# Patient Record
Sex: Male | Born: 1965 | Race: Black or African American | Hispanic: No | Marital: Single | Smoking: Current every day smoker
Health system: Southern US, Community
[De-identification: ages and names within clinical notes are randomized; demographics above are authoritative.]

## PROBLEM LIST (undated history)

## (undated) DIAGNOSIS — E119 Type 2 diabetes mellitus without complications: Secondary | ICD-10-CM

## (undated) DIAGNOSIS — I10 Essential (primary) hypertension: Secondary | ICD-10-CM

## (undated) HISTORY — PX: TONSILLECTOMY: SUR1361

## (undated) HISTORY — PX: OTHER SURGICAL HISTORY: SHX169

---

## 2004-06-11 ENCOUNTER — Emergency Department: Payer: Self-pay | Admitting: Emergency Medicine

## 2006-01-20 ENCOUNTER — Emergency Department: Payer: Self-pay | Admitting: Emergency Medicine

## 2006-08-15 ENCOUNTER — Ambulatory Visit: Payer: Self-pay | Admitting: Gastroenterology

## 2017-01-17 ENCOUNTER — Ambulatory Visit: Payer: Self-pay

## 2017-01-29 ENCOUNTER — Ambulatory Visit: Payer: Self-pay | Admitting: Medical

## 2017-01-29 ENCOUNTER — Encounter: Payer: Self-pay | Admitting: Medical

## 2017-01-29 VITALS — BP 160/84 | HR 89 | Temp 98.0°F | Resp 16 | Ht 69.0 in | Wt 260.0 lb

## 2017-01-29 DIAGNOSIS — J069 Acute upper respiratory infection, unspecified: Secondary | ICD-10-CM

## 2017-01-29 DIAGNOSIS — R059 Cough, unspecified: Secondary | ICD-10-CM

## 2017-01-29 DIAGNOSIS — R05 Cough: Secondary | ICD-10-CM

## 2017-01-29 MED ORDER — AZITHROMYCIN 250 MG PO TABS: ORAL_TABLET | ORAL | 0 refills | Status: DC

## 2017-01-29 MED ORDER — ALBUTEROL SULFATE HFA 108 (90 BASE) MCG/ACT IN AERS: 2.0000 | INHALATION_SPRAY | Freq: Four times a day (QID) | RESPIRATORY_TRACT | 2 refills | Status: DC | PRN

## 2017-01-29 NOTE — Progress Notes (Signed)
Subjective:    Patient ID: Randy Deleon, male    DOB: 06/22/1965, 51 y.o.   MRN: [id number]  HPI 51 yo male with cough x 8 days, nonproductive, some post nasal drip and irritation to the throat.  No sore throat now. No fever or chills. No history of pneumonia or lung problems. 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  Sees Dr Samuella CotaJadinski at [facility]. Did not take blood pressure medication today.  Review of Systems  Constitutional: Negative for chills, fatigue and fever.  HENT: Positive for postnasal drip, sinus pressure, sneezing and sore throat (sometimes). Negative for congestion, ear pain, rhinorrhea, sinus pain and trouble swallowing.   Eyes: Negative for discharge and itching.  Respiratory: Positive for cough. Negative for shortness of breath.   Cardiovascular: Negative for chest pain, palpitations and leg swelling.  Gastrointestinal: Positive for abdominal distention (bloated after eating). Negative for abdominal pain.  Endocrine: Negative for cold intolerance and heat intolerance.  Genitourinary: Negative for dysuria.  Musculoskeletal: Negative for myalgias.  Skin: Negative for rash.  Allergic/Immunologic: Positive for environmental allergies (history of allergy injections 2 x /week, stopped in  6 years.) and food allergies (shellfish and lima beans).  Neurological: Negative for dizziness, syncope and light-headedness.  Hematological: Negative for adenopathy.       Objective:   Physical Exam  Constitutional: He is oriented to person, place, and time. He appears well-developed and well-nourished.  HENT:  Head: Normocephalic and atraumatic.  Left Ear: External ear normal.  Mouth/Throat: Oropharynx is clear and moist.  Eyes: Conjunctivae and EOM are normal. Pupils are equal, round, and reactive to light.   Neck: Normal range of motion. Neck supple.  Cardiovascular: Normal rate, regular rhythm and normal heart sounds.  Pulmonary/Chest: Effort normal and breath sounds normal.  Musculoskeletal: Normal range of motion.  Lymphadenopathy:    He has no cervical adenopathy.  Neurological: He is alert and oriented to person, place, and time.  Skin: Skin is warm and dry.  Psychiatric: He has a normal mood and affect. His behavior is normal. Judgment and thought content normal.  Nursing note and vitals reviewed.     Cough in room mild with end expiratory mild wheeze, cleared on coughing.    Assessment & Plan:  Upper respiratory infection/ cough OTC Claritin or Zyertec take as directed for post nasal drip.  OTC Flonase  Take as directed. Using OTC cough medication formulated for diabetics. Declines prescription cough medication Only medications eprescribed today is Z-pak and albuterol MDI. Other are all updates Meds ordered this encounter  Medications  . amLODipine-benazepril (LOTREL) 10-20 MG capsule    Sig: TAKE 1 CAPSULE BY MOUTH EVERY DAY IN THE MORNING    Refill:  2  . busPIRone (BUSPAR) 7.5 MG tablet    Sig: Take 7.5 mg 2 (two) times daily by mouth.    Refill:  1  . INVOKANA 300 MG TABS tablet    Sig: TAKE 1 TABLET BY MOUTH EVERY DAY IN THE MORNING    Refill:  2  . carvedilol (COREG) 12.5 MG tablet    Sig: Take 12.5 mg 2 (two) times daily by mouth.    Refill:  2  . [facility] D3 5000 units capsule    Sig: 1 EVERY MORNING START AFTER COMPLETION OF [id number] UNITS THERAPY  Refill:  2  . fenofibrate 160 MG tablet    Sig: Take 160 mg daily by mouth.    Refill:  2  . pioglitazone (ACTOS) 45 MG tablet    Sig: Take 45 mg daily by mouth.    Refill:  0  . rosuvastatin (CRESTOR) 10 MG tablet    Sig: Take 10 mg daily by mouth.    Refill:  2  . KOMBIGLYZE XR 07-998 MG TB24    Sig: Take 1 tablet 2 (two) times daily by mouth.    Refill:  2  . sildenafil (VIAGRA) 100 MG tablet    Sig: AS NEEDED  DAILY 30 MIN TO 4HR BEFORE INTERCOURSE    Refill:  2  . azithromycin (ZITHROMAX) 250 MG tablet    Sig: Take  2 tablets by mouth today then one tablet days 2-5 take with food.    Dispense:  6 tablet    Refill:  0  . albuterol (PROVENTIL HFA;VENTOLIN HFA) 108 (90 Base) MCG/ACT inhaler    Sig: Inhale 2 puffs every 6 (six) hours as needed into the lungs for wheezing or shortness of breath.    Dispense:  1 Inhaler    Refill:  2  Returtn to clinic in 3-5 days if not improving or sooner if worsening. Patient verbalizes understanding and had no questions at discharge.

## 2017-01-29 NOTE — Patient Instructions (Signed)
Cough, Adult A cough helps to clear your throat and lungs. A cough may last only 2-3 weeks (acute), or it may last longer than 8 weeks (chronic). Many different things can cause a cough. A cough may be a sign of an illness or another medical condition. Follow these instructions at home:  Pay attention to any changes in your cough.  Take medicines only as told by your doctor. ? If you were prescribed an antibiotic medicine, take it as told by your doctor. Do not stop taking it even if you start to feel better. ? Talk with your doctor before you try using a cough medicine.  Drink enough fluid to keep your pee (urine) clear or pale yellow.  If the air is dry, use a cold steam vaporizer or humidifier in your home.  Stay away from things that make you cough at work or at home.  If your cough is worse at night, try using extra pillows to raise your head up higher while you sleep.  Do not smoke, and try not to be around smoke. If you need help quitting, ask your doctor.  Do not have caffeine.  Do not drink alcohol.  Rest as needed. Contact a doctor if:  You have new problems (symptoms).  You cough up yellow fluid (pus).  Your cough does not get better after 2-3 weeks, or your cough gets worse.  Medicine does not help your cough and you are not sleeping well.  You have pain that gets worse or pain that is not helped with medicine.  You have a fever.  You are losing weight and you do not know why.  You have night sweats. Get help right away if:  You cough up blood.  You have trouble breathing.  Your heartbeat is very fast. This information is not intended to replace advice given to you by your health care provider. Make sure you discuss any questions you have with your health care provider. Document Released: 11/17/2010 Document Revised: 08/12/2015 Document Reviewed: 05/13/2014 Elsevier Interactive Patient Education  2018 [employer] Upper Respiratory Infection,  Adult Most upper respiratory infections (URIs) are caused by a virus. A URI affects the nose, throat, and upper air passages. The most common type of URI is often called "the common cold." Follow these instructions at home:  Take medicines only as told by your doctor.  Gargle warm saltwater or take cough drops to comfort your throat as told by your doctor.  Use a warm mist humidifier or inhale steam from a shower to increase air moisture. This may make it easier to breathe.  Drink enough fluid to keep your pee (urine) clear or pale yellow.  Eat soups and other clear broths.  Have a healthy diet.  Rest as needed.  Go back to work when your fever is gone or your doctor says it is okay. ? You may need to stay home longer to avoid giving your URI to others. ? You can also wear a face mask and wash your hands often to prevent spread of the virus.  Use your inhaler more if you have asthma.  Do not use any tobacco products, including cigarettes, chewing tobacco, or electronic cigarettes. If you need help quitting, ask your doctor. Contact a doctor if:  You are getting worse, not better.  Your symptoms are not helped by medicine.  You have chills.  You are getting more short of breath.  You have brown or red mucus.  You have yellow or   brown discharge from your nose.  You have pain in your face, especially when you bend forward.  You have a fever.  You have puffy (swollen) neck glands.  You have pain while swallowing.  You have white areas in the back of your throat. Get help right away if:  You have very bad or constant: ? Headache. ? Ear pain. ? Pain in your forehead, behind your eyes, and over your cheekbones (sinus pain). ? Chest pain.  You have long-lasting (chronic) lung disease and any of the following: ? Wheezing. ? Long-lasting cough. ? Coughing up blood. ? A change in your usual mucus.  You have a stiff neck.  You have changes in  your: ? Vision. ? Hearing. ? Thinking. ? Mood. This information is not intended to replace advice given to you by your health care provider. Make sure you discuss any questions you have with your health care provider. Document Released: 08/23/2007 Document Revised: 11/07/2015 Document Reviewed: 06/11/2013 Elsevier Interactive Patient Education  2018 Elsevier Inc.  

## 2017-04-11 ENCOUNTER — Ambulatory Visit: Payer: Self-pay | Admitting: Medical

## 2017-04-11 ENCOUNTER — Encounter: Payer: Self-pay | Admitting: Medical

## 2017-04-11 VITALS — BP 162/84 | HR 95 | Temp 97.4°F | Ht 69.0 in | Wt 258.2 lb

## 2017-04-11 DIAGNOSIS — B349 Viral infection, unspecified: Secondary | ICD-10-CM

## 2017-04-11 NOTE — Patient Instructions (Addendum)
OTC Mucinex. Take as directed. OTC Motrin orTylenol take as directed.  OTC Flonase take as directed.   Oxymetazoline or Afrin only use  4 days in a row..     Viral Illness, Adult Viruses are tiny germs that can get into a person's body and cause illness. There are many different types of viruses, and they cause many types of illness. Viral illnesses can range from mild to severe. They can affect various parts of the body. Common illnesses that are caused by a virus include colds and the flu. Viral illnesses also include serious conditions such as HIV/AIDS (human immunodeficiency virus/acquired immunodeficiency syndrome). A few viruses have been linked to certain cancers. What are the causes? Many types of viruses can cause illness. Viruses invade cells in your body, multiply, and cause the infected cells to malfunction or die. When the cell dies, it releases more of the virus. When this happens, you develop symptoms of the illness, and the virus continues to spread to other cells. If the virus takes over the function of the cell, it can cause the cell to divide and grow out of control, as is the case when a virus causes cancer. Different viruses get into the body in different ways. You can get a virus by:  Swallowing food or water that is contaminated with the virus.  Breathing in droplets that have been coughed or sneezed into the air by an infected person.  Touching a surface that has been contaminated with the virus and then touching your eyes, nose, or mouth.  Being bitten by an insect or animal that carries the virus.  Having sexual contact with a person who is infected with the virus.  Being exposed to blood or fluids that contain the virus, either through an open cut or during a transfusion.  If a virus enters your body, your body's defense system (immune system) will try to fight the virus. You may be at higher risk for a viral illness if your immune system is weak. What are the  signs or symptoms? Symptoms vary depending on the type of virus and the location of the cells that it invades. Common symptoms of the main types of viral illnesses include: Cold and flu viruses  Fever.  Headache.  Sore throat.  Muscle aches.  Nasal congestion.  Cough. Digestive system (gastrointestinal) viruses  Fever.  Abdominal pain.  Nausea.  Diarrhea. Liver viruses (hepatitis)  Loss of appetite.  Tiredness.  Yellowing of the skin (jaundice). Brain and spinal cord viruses  Fever.  Headache.  Stiff neck.  Nausea and vomiting.  Confusion or sleepiness. Skin viruses  Warts.  Itching.  Rash. Sexually transmitted viruses  Discharge.  Swelling.  Redness.  Rash. How is this treated? Viruses can be difficult to treat because they live within cells. Antibiotic medicines do not treat viruses because these drugs do not get inside cells. Treatment for a viral illness may include:  Resting and drinking plenty of fluids.  Medicines to relieve symptoms. These can include over-the-counter medicine for pain and fever, medicines for cough or congestion, and medicines to relieve diarrhea.  Antiviral medicines. These drugs are available only for certain types of viruses. They may help reduce flu symptoms if taken early. There are also many antiviral medicines for hepatitis and HIV/AIDS.  Some viral illnesses can be prevented with vaccinations. A common example is the flu shot. Follow these instructions at home: Medicines   Take over-the-counter and prescription medicines only as told by your health care  provider.  If you were prescribed an antiviral medicine, take it as told by your health care provider. Do not stop taking the medicine even if you start to feel better.  Be aware of when antibiotics are needed and when they are not needed. Antibiotics do not treat viruses. If your health care provider thinks that you may have a bacterial infection as well as  a viral infection, you may get an antibiotic. ? Do not ask for an antibiotic prescription if you have been diagnosed with a viral illness. That will not make your illness go away faster. ? Frequently taking antibiotics when they are not needed can lead to antibiotic resistance. When this develops, the medicine no longer works against the bacteria that it normally fights. General instructions  Drink enough fluids to keep your urine clear or pale yellow.  Rest as much as possible.  Return to your normal activities as told by your health care provider. Ask your health care provider what activities are safe for you.  Keep all follow-up visits as told by your health care provider. This is important. How is this prevented? Take these actions to reduce your risk of viral infection:  Eat a healthy diet and get enough rest.  Wash your hands often with soap and water. This is especially important when you are in public places. If soap and water are not available, use hand sanitizer.  Avoid close contact with friends and family who have a viral illness.  If you travel to areas where viral gastrointestinal infection is common, avoid drinking water or eating raw food.  Keep your immunizations up to date. Get a flu shot every year as told by your health care provider.  Do not share toothbrushes, nail clippers, razors, or needles with other people.  Always practice safe sex.  Contact a health care provider if:  You have symptoms of a viral illness that do not go away.  Your symptoms come back after going away.  Your symptoms get worse. Get help right away if:  You have trouble breathing.  You have a severe headache or a stiff neck.  You have severe vomiting or abdominal pain. This information is not intended to replace advice given to you by your health care provider. Make sure you discuss any questions you have with your health care provider. Document Released: 07/16/2015 Document  Revised: 08/18/2015 Document Reviewed: 07/16/2015 Elsevier Interactive Patient Education  [employer].

## 2017-04-11 NOTE — Progress Notes (Signed)
   Subjective:    Patient ID: Randy Deleon, male    DOB: [birth year], 52 y.o.   MRN: [id number]  HPI 52 yo male in non acute distress with nasal congestion x 1 day ( started yesterday with sneezing , went and laid down woke up with nasal congestion, no body aches. Took no over the counter medications. No fever or chills..Eyes watering. No wheezing. Smoker 1/2 ppd.    Review of Systems  Constitutional: Negative for chills and fever.  HENT: Positive for congestion, rhinorrhea (clear) and sneezing. Negative for ear pain, sinus pressure, sinus pain and sore throat.   Eyes: Negative for discharge and itching.  Respiratory: Negative for cough and shortness of breath.   Cardiovascular: Negative for chest pain.  Gastrointestinal: Negative for abdominal pain.  Musculoskeletal: Negative for myalgias.  Skin: Negative for rash.  Allergic/Immunologic: Positive for environmental allergies (allergy injections  6 years ago. x 2 years. allergies to  pollen , grasses.) and food allergies.  Neurological: Negative for dizziness and syncope.  Hematological: Negative for adenopathy.       Objective:   Physical Exam  Constitutional: He is oriented to person, place, and time. He appears well-developed and well-nourished.  HENT:  Head: Normocephalic and atraumatic.  Right Ear: External ear normal.  Left Ear: External ear normal.  Nose: Mucosal edema and rhinorrhea present.  Mouth/Throat: Oropharynx is clear and moist.  Eyes: Conjunctivae and EOM are normal. Pupils are equal, round, and reactive to light.  Neck: Normal range of motion. Neck supple.  Cardiovascular: Normal rate, regular rhythm and normal heart sounds.  Pulmonary/Chest: Effort normal. No respiratory distress. He has no wheezes. He has no rales.  Musculoskeletal: Normal range of motion.  Lymphadenopathy:    He has no cervical adenopathy.  Neurological: He is alert and oriented to person, place, and time.  Skin: Skin is warm and dry.   Psychiatric: He has a normal mood and affect. His behavior is normal. Judgment and thought content normal.  Nursing note and vitals reviewed.    Turbinate swelling bilateral. sneezing in room    Assessment & Plan:  Viral illness. Reviewed with patient that antibiotics do not help viruses. Plain Mucinex take as directed. OTC Flonase take as directed while sick only. If you have OTC Afrin at  Home  take as directed 4 days only. OTC Motrin or Tylenol take as directed. Given Tylenol 1 gram in clinic .[id number] exp  6/21. Contact the office if discharge or productive cough in green. Fever 101 or higher or if sick longer than 10 days. Rest , increase fluids. Return to the clinic as needed. Patient verbalizes understanding and has no questions at discharge.

## 2017-11-19 ENCOUNTER — Ambulatory Visit: Payer: Self-pay | Admitting: Adult Health

## 2017-11-19 ENCOUNTER — Encounter: Payer: Self-pay | Admitting: Medical

## 2017-11-19 VITALS — BP 145/91 | HR 94 | Temp 98.2°F | Resp 16 | Wt 220.8 lb

## 2017-11-19 DIAGNOSIS — R5383 Other fatigue: Secondary | ICD-10-CM | POA: Insufficient documentation

## 2017-11-19 DIAGNOSIS — K1379 Other lesions of oral mucosa: Secondary | ICD-10-CM

## 2017-11-19 DIAGNOSIS — R531 Weakness: Secondary | ICD-10-CM | POA: Insufficient documentation

## 2017-11-19 DIAGNOSIS — R634 Abnormal weight loss: Secondary | ICD-10-CM

## 2017-11-19 DIAGNOSIS — B370 Candidal stomatitis: Secondary | ICD-10-CM

## 2017-11-19 MED ORDER — NYSTATIN 100000 UNIT/ML MT SUSP: 5.0000 mL | Freq: Four times a day (QID) | OROMUCOSAL | 0 refills | Status: DC

## 2017-11-19 NOTE — Patient Instructions (Addendum)
Nystatin oral suspension What is this medicine? NYSTATIN (nye STAT in) is an antifungal medicine. It is used to treat certain kinds of fungal or yeast infections. This medicine may be used for other purposes; ask your health care provider or pharmacist if you have questions. COMMON BRAND NAME(S): Mycostatin, Nystex What should I tell my health care provider before I take this medicine? They need to know if you have any of these conditions: -diabetes -kidney disease -an unusual or allergic reaction to nystatin, ethylenediamine, parabens, thimerosal, other foods, dyes or preservatives -pregnant or trying to get pregnant -breast-feeding How should I use this medicine? Follow the directions on the prescription label. Shake well before using. Use a specially marked dropper to measure every dose. Ask your pharmacist if you do not have one. Put one half of the dose in each side of your mouth. Swish the medicine around in your mouth and gargle. Hold your dose in your mouth for as long as you can. Swallow or spit out as directed by your doctor. Take your medicine at regular intervals. Do not take your medicine more often than directed. Do not skip doses or stop your medicine early even if you feel better. Do not stop taking except on your doctor's advice. Talk to your pediatrician regarding the use of this medicine in children. Special care may be needed. Overdosage: If you think you have taken too much of this medicine contact a poison control center or emergency room at once. NOTE: This medicine is only for you. Do not share this medicine with others. What if I miss a dose? If you miss a dose, take it as soon as you can. If it is almost time for your next dose, take only that dose. Do not take double or extra doses. What may interact with this medicine? Interactions are not expected. This list may not describe all possible interactions. Give your health care provider a list of all the medicines, herbs,  non-prescription drugs, or dietary supplements you use. Also tell them if you smoke, drink alcohol, or use illegal drugs. Some items may interact with your medicine. What should I watch for while using this medicine? Tell your doctor or health care professional if your symptoms do not improve or get worse. If you wear dentures talk to your doctor about how to clean them. What side effects may I notice from receiving this medicine? Side effects that you should report to your doctor or health care professional as soon as possible: -allergic reactions like skin rash, itching or hives, swelling of the face, lips, or tongue -fast heart beat -redness, blistering, peeling or loosening of the skin, including inside the mouth -trouble breathing Side effects that usually do not require medical attention (report to your doctor or health care professional if they continue or are bothersome): -diarrhea -muscle aches or pains -nausea, vomiting -stomach upset This list may not describe all possible side effects. Call your doctor for medical advice about side effects. You may report side effects to FDA at 1-800-FDA-1088. Where should I keep my medicine? Keep out of the reach of children. Store at room temperature between 15 and 25 degrees C (59 and 77 degrees F). Protect from light. Throw away any unused medicine after the expiration date. NOTE: This sheet is a summary. It may not cover all possible information. If you have questions about this medicine, talk to your doctor, pharmacist, or health care provider.  2018 Elsevier/Gold Standard (2008-04-07 13:21:00) Diabetes Mellitus and Sick Day Management  Blood sugar (glucose) can be difficult to control when you are sick. Common illnesses that can cause problems for people with diabetes (diabetes mellitus) include colds, fever, flu (influenza), nausea, vomiting, and diarrhea. These illnesses can cause stress and loss of body fluids (dehydration), and those  issues can cause blood glucose levels to increase. Because of this, it is very important to take your insulin and diabetes medicines and eat some form of carbohydrate when you are sick. You should make a plan for days when you are sick (sick day plan) as part of your diabetes management plan. You and your health care provider should make this plan in advance. The following guidelines are intended to help you manage an illness that lasts for about 24 hours or less. Your health care provider may also give you more specific instructions. What do I need to do to manage my blood glucose?  Check your blood glucose every 2-4 hours, or as often as told by your health care provider.  Know your sick day treatment goals. Your target blood glucose levels may be different when you are sick.  If you use insulin, take your usual dose. ? If your blood glucose continues to be too high, you may need to take an additional insulin dose as told by your health care provider.  If you use oral diabetes medicine, you may need to stop taking it if you are not able to eat or drink normally. Ask your health care provider about whether you need to stop taking these medicines while you are sick.  If you use injectable hormone medicines other than insulin to control your diabetes, ask your health care provider about whether you need to stop taking these medicines while you are sick. What else can I do to manage my diabetes when I am sick? Check your ketones  If you have type 1 diabetes, check your urine ketones every 4 hours.  If you have type 2 diabetes, check your urine ketones as often as told by your health care provider. Drink fluids  Drink enough fluid to keep your urine clear or pale yellow. This is especially important if you have a fever, vomiting, or diarrhea. Those symptoms can lead to dehydration.  Follow any instructions from your health care provider about beverages to avoid. ? Do not drink alcohol,  caffeine, or drinks that contain a lot of sugar. Take medicines as directed  Take-over-the-counter and prescription medicines only as told by your health care provider.  Check medicine labels for added sugars. Some medicines may contain sugar or types of sugars that can raise your blood glucose level. What foods can I eat when I am sick? You need to eat some form of carbohydrates when you are sick. You should eat 45-50 grams (45-50 g) of carbohydrates every 3-4 hours until you feel better. All of the food choices below contain about 15 g of carbohydrates. Plan ahead and keep some of these foods around so you have them if you get sick.  4-6 oz (120-177 mL) carbonated beverage that contains sugar, such as regular (not diet) soda. You may be able to drink carbonated beverages more easily if you open the beverage and let it sit at room temperature for a few minutes before drinking.   of a twin frozen ice pop.  4 oz (120 g) regular gelatin.  4 oz (120 mL) fruit juice.  4 oz (120 g) ice cream or frozen yogurt.  2 oz (60 g) sherbet.  8 oz (  240 mL) clear broth or soup.  4 oz (120 g) regular custard.  4 oz (120 g) regular pudding.  8 oz (240 g) plain yogurt.  1 slice bread or toast.  6 saltine crackers.  5 vanilla wafers.  Questions to ask your health care provider Consider asking the following questions so you know what to do on days when you are sick:  Should I adjust my diabetes medicines?  How often do I need to check my blood glucose?  What supplies do I need to manage my diabetes at home when I am sick?  What number can I call if I have questions?  What foods and drinks should I avoid?  Contact a health care provider if:  You develop symptoms of diabetic ketoacidosis, such as: ? Fatigue. ? Weight loss. ? Excessive thirst. ? Light-headedness. ? Fruity or sweet-smelling breath. ? Excessive urination. ? Vision changes. ? Confusion or  irritability. ? Nausea. ? Vomiting. ? Rapid breathing. ? Pain in the abdomen. ? Feeling flushed.  You are unable to drink fluids without vomiting.  You have any of the following for more than 6 hours: ? Nausea. ? Vomiting. ? Diarrhea.  Your blood glucose is at or above 240 mg/dL (16.1 mmol/L), even after you take an additional insulin dose.  You have a change in how you think, feel, or act (mental status).  You develop another serious illness.  You have been sick or have had a fever for 2 days or longer and you are not getting better. Get help right away if:  Your blood glucose is lower than 54 mg/dL (3.0 mmol/L).  You have difficulty breathing.  You have moderate or high ketone levels in your urine.  You used emergency glucagon to treat low blood glucose. Summary  Blood sugar (glucose) can be difficult to control when you are sick. Common illnesses that can cause problems for people with diabetes (diabetes mellitus) include colds, fever, flu (influenza), nausea, vomiting, and diarrhea.  Illnesses can cause stress and loss of body fluids (dehydration), and those issues can cause blood glucose levels to increase.  Make a plan for days when you are sick (sick day plan) as part of your diabetes management plan. You and your health care provider should make this plan in advance.  It is very important to take your insulin and diabetes medicines and to eat some form of carbohydrate when you are sick.  Contact your health care provider if have problems managing your blood glucose levels when you are sick, or if you have been sick or had a fever for 2 days or longer and are not getting better. This information is not intended to replace advice given to you by your health care provider. Make sure you discuss any questions you have with your health care provider. Document Released: 03/09/2003 Document Revised: 12/03/2015 Document Reviewed: 12/03/2015 Elsevier Interactive Patient  Education  2018 [employer]. Fatigue Fatigue is feeling tired all of the time, a lack of energy, or a lack of motivation. Occasional or mild fatigue is often a normal response to activity or life in general. However, long-lasting (chronic) or extreme fatigue may indicate an underlying medical condition. Follow these instructions at home: Watch your fatigue for any changes. The following actions may help to lessen any discomfort you are feeling:  Talk to your health care provider about how much sleep you need each night. Try to get the required amount every night.  Take medicines only as directed by your  health care provider.  Eat a healthy and nutritious diet. Ask your health care provider if you need help changing your diet.  Drink enough fluid to keep your urine clear or pale yellow.  Practice ways of relaxing, such as yoga, meditation, massage therapy, or acupuncture.  Exercise regularly.  Change situations that cause you stress. Try to keep your work and personal routine reasonable.  Do not abuse illegal drugs.  Limit alcohol intake to no more than 1 drink per day for nonpregnant women and 2 drinks per day for men. One drink equals 12 ounces of beer, 5 ounces of wine, or 1 ounces of hard liquor.  Take a multivitamin, if directed by your health care provider.  Contact a health care provider if:  Your fatigue does not get better.  You have a fever.  You have unintentional weight loss or gain.  You have headaches.  You have difficulty: ? Falling asleep. ? Sleeping throughout the night.  You feel angry, guilty, anxious, or sad.  You are unable to have a bowel movement (constipation).  You skin is dry.  Your legs or another part of your body is swollen. Get help right away if:  You feel confused.  Your vision is blurry.  You feel faint or pass out.  You have a severe headache.  You have severe abdominal, pelvic, or back pain.  You have chest pain,  shortness of breath, or an irregular or fast heartbeat.  You are unable to urinate or you urinate less than normal.  You develop abnormal bleeding, such as bleeding from the rectum, vagina, nose, lungs, or nipples.  You vomit blood.  You have thoughts about harming yourself or committing suicide.  You are worried that you might harm someone else. This information is not intended to replace advice given to you by your health care provider. Make sure you discuss any questions you have with your health care provider. Document Released: 01/01/2007 Document Revised: 08/12/2015 Document Reviewed: 07/08/2013 Elsevier Interactive Patient Education  2018 [employer]. Oral Thrush, Adult Oral thrush is an infection in your mouth and throat. It causes white patches on your tongue and in your mouth. Follow these instructions at home: Helping with soreness  To lessen your pain: ? Drink cold liquids, like water and iced tea. ? Eat frozen ice pops or frozen juices. ? Eat foods that are easy to swallow, like gelatin and ice cream. ? Drink from a straw if the patches in your mouth are painful. General instructions   Take or use over-the-counter and prescription medicines only as told by your doctor. Medicine for oral thrush may be something to swallow, or it may be something to put on the infected area.  Eat plain yogurt that has live cultures in it. Read the label to make sure.  If you wear dentures: ? Take out your dentures before you go to bed. ? Brush them well. ? Soak them in a denture cleaner.  Rinse your mouth with warm salt-water many times a day. To make the salt-water mixture, completely dissolve 1/2-1 teaspoon of salt in 1 cup of warm water. Contact a doctor if:  Your problems are getting worse.  Your problems do not get better in less than 7 days with treatment.  Your infection is spreading. This may show as white patches on the skin outside of your mouth.  You are nursing  your baby and you have redness and pain in the nipples. This information is not intended to replace  advice given to you by your health care provider. Make sure you discuss any questions you have with your health care provider. Document Released: 05/31/2009 Document Revised: 11/29/2015 Document Reviewed: 11/29/2015 Elsevier Interactive Patient Education  2017 [employer] Probiotics What are probiotics? Probiotics are the good bacteria and yeasts that live in your body and keep you and your digestive system healthy. Probiotics also help your body's defense (immune) system and protect your body against bad bacterial growth. Certain foods contain probiotics, such as yogurt. Probiotics can also be purchased as a supplement. As with any supplement or drug, it is important to discuss its use with your health care provider. What affects the balance of bacteria in my body? The balance of bacteria in your body can be affected by:  Antibiotic medicines. Antibiotics are sometimes necessary to treat infection. Unfortunately, they may kill good or friendly bacteria in your body as well as the bad bacteria. This may lead to stomach problems like diarrhea, gas, and cramping.  Disease. Some conditions are the result of an overgrowth of bad bacteria, yeasts, parasites, or fungi. These conditions include: ? Infectious diarrhea. ? Stomach and respiratory infections. ? Skin infections. ? Irritable bowel syndrome (IBS). ? Inflammatory bowel diseases. ? Ulcer due to Helicobacter pylori (H. pylori) infection. ? Tooth decay and periodontal disease. ? Vaginal infections.  Stress and poor diet may also lower the good bacteria in your body. What type of probiotic is right for me? Probiotics are available over the counter at your local pharmacy, health food, or grocery store. They come in many different forms, combinations of strains, and dosing strengths. Some may need to be refrigerated. Always read the label for  storage and usage instructions. Specific strains have been shown to be more effective for certain conditions. Ask your health care provider what option is best for you. Why would I need probiotics? There are many reasons your health care provider might recommend a probiotic supplement, including:  Diarrhea.  Constipation.  IBS.  Respiratory infections.  Yeast infections.  Acne, eczema, and other skin conditions.  Frequent urinary tract infections (UTIs).  Are there side effects of probiotics? Some people experience mild side effects when taking probiotics. Side effects are usually temporary and may include:  Gas.  Bloating.  Cramping.  Rarely, serious side effects, such as infection or immune system changes, may occur. What else do I need to know about probiotics?  There are many different strains of probiotics. Certain strains may be more effective depending on your condition. Probiotics are available in varying doses. Ask your health care provider which probiotic you should use and how often.  If you are taking probiotics along with antibiotics, it is generally recommended to wait at least 2 hours between taking the antibiotic and taking the probiotic. For more information: [facility] [email or web address] This information is not intended to replace advice given to you by your health care provider. Make sure you discuss any questions you have with your health care provider. Document Released: 10/01/2013 Document Revised: 02/01/2016 Document Reviewed: 06/03/2013 Elsevier Interactive Patient Education  2017 [employer] Inc[employer]  Health Maintenance, Male A healthy lifestyle and preventive care is important for your health and wellness. Ask your health care provider about what schedule of regular examinations is right for you. What should I know about weight and diet? Eat a Healthy Diet  Eat plenty of vegetables, fruits, whole  grains, low-fat dairy products, and lean protein.  Do not eat a lot of  foods high in solid fats, added sugars, or salt.  Maintain a Healthy Weight Regular exercise can help you achieve or maintain a healthy weight. You should:  Do at least 150 minutes of exercise each week. The exercise should increase your heart rate and make you sweat (moderate-intensity exercise).  Do strength-training exercises at least twice a week.  Watch Your Levels of Cholesterol and Blood Lipids  Have your blood tested for lipids and cholesterol every 5 years starting at 50 years of age. If you are at high risk for heart disease, you should start having your blood tested when you are 50 years old. You may need to have your cholesterol levels checked more often if: ? Your lipid or cholesterol levels are high. ? You are older than 50 years of age. ? You are at high risk for heart disease.  What should I know about cancer screening? Many types of cancers can be detected early and may often be prevented. Lung Cancer  You should be screened every year for lung cancer if: ? You are a current smoker who has smoked for at least 30 years. ? You are a former smoker who has quit within the past 15 years.  Talk to your health care provider about your screening options, when you should start screening, and how often you should be screened.  Colorectal Cancer  Routine colorectal cancer screening usually begins at 50 years of age and should be repeated every 5-10 years until you are 50 years old. You may need to be screened more often if early forms of precancerous polyps or small growths are found. Your health care provider may recommend screening at an earlier age if you have risk factors for colon cancer.  Your health care provider may recommend using home test kits to check for hidden blood in the stool.  A small camera at the end of a tube can be used to examine your colon (sigmoidoscopy or colonoscopy). This checks  for the earliest forms of colorectal cancer.  Prostate and Testicular Cancer  Depending on your age and overall health, your health care provider may do certain tests to screen for prostate and testicular cancer.  Talk to your health care provider about any symptoms or concerns you have about testicular or prostate cancer.  Skin Cancer  Check your skin from head to toe regularly.  Tell your health care provider about any new moles or changes in moles, especially if: ? There is a change in a mole's size, shape, or color. ? You have a mole that is larger than a pencil eraser.  Always use sunscreen. Apply sunscreen liberally and repeat throughout the day.  Protect yourself by wearing long sleeves, pants, a wide-brimmed hat, and sunglasses when outside.  What should I know about heart disease, diabetes, and high blood pressure?  If you are 28-27 years of age, have your blood pressure checked every 3-5 years. If you are 62 years of age or older, have your blood pressure checked every year. You should have your blood pressure measured twice-once when you are at a hospital or clinic, and once when you are not at a hospital or clinic. Record the average of the two measurements. To check your blood pressure when you are not at a hospital or clinic, you can use: ? An automated blood pressure machine at a pharmacy. ? A home blood pressure monitor.  Talk to your health care provider about your target blood pressure.  If you are between  20-59 years old, ask your health care provider if you should take aspirin to prevent heart disease.  Have regular diabetes screenings by checking your fasting blood sugar level. ? If you are at a normal weight and have a low risk for diabetes, have this test once every three years after the age of 42. ? If you are overweight and have a high risk for diabetes, consider being tested at a younger age or more often.  A one-time screening for abdominal aortic aneurysm  (AAA) by ultrasound is recommended for men aged 65-75 years who are current or former smokers. What should I know about preventing infection? Hepatitis B If you have a higher risk for hepatitis B, you should be screened for this virus. Talk with your health care provider to find out if you are at risk for hepatitis B infection. Hepatitis C Blood testing is recommended for:  Everyone born from [birth year] through 1963.  Anyone with known risk factors for hepatitis C.  Sexually Transmitted Diseases (STDs)  You should be screened each year for STDs including gonorrhea and chlamydia if: ? You are sexually active and are younger than 50 years of age. ? You are older than 50 years of age and your health care provider tells you that you are at risk for this type of infection. ? Your sexual activity has changed since you were last screened and you are at an increased risk for chlamydia or gonorrhea. Ask your health care provider if you are at risk.  Talk with your health care provider about whether you are at high risk of being infected with HIV. Your health care provider may recommend a prescription medicine to help prevent HIV infection.  What else can I do?  Schedule regular health, dental, and eye exams.  Stay current with your vaccines (immunizations).  Do not use any tobacco products, such as cigarettes, chewing tobacco, and e-cigarettes. If you need help quitting, ask your health care provider.  Limit alcohol intake to no more than 2 drinks per day. One drink equals 12 ounces of beer, 5 ounces of wine, or 1 ounces of hard liquor.  Do not use street drugs.  Do not share needles.  Ask your health care provider for help if you need support or information about quitting drugs.  Tell your health care provider if you often feel depressed.  Tell your health care provider if you have ever been abused or do not feel safe at home. This information is not intended to replace advice given to you  by your health care provider. Make sure you discuss any questions you have with your health care provider. Document Released: 09/02/2007 Document Revised: 11/03/2015 Document Reviewed: 12/08/2014 Elsevier Interactive Patient Education  [employer].

## 2017-11-19 NOTE — Progress Notes (Addendum)
Subjective:     Patient ID: Randy Deleon, male   DOB: September 29, 1965, 52 y.o.   MRN: [id number]  HPI   Blood pressure (!) 145/91, pulse 94, temperature 98.2 F (36.8 C), temperature source Tympanic, resp. rate 16, weight 220 lb 12.8 oz (100.2 kg), SpO2 99 %. Patient is 52 year old male in no acute distress who comes to clinic on 11/19/17 he reports decreased energy x 3- 4 days. Fatigue. Mild sores on tongue and throat. He has been drinking Gatorades and water for the past three days. Sunday morning he ate two eggs ad some toast. He has also been eating popsicles as they feel good on his tongue.  He reports he has just got off night shift - he is working two jobs.  He reports pain in throat nagging 2/10 , states " tongue very sore " keeping me from eating much.   Smoker x 20 year 1/2 pp. He reports he has been under stress at home lately. Denies any homicidal or suicidal ideation.  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 Daughter battling addiction. He reports he himself has lost 20 - 30 lbs in past three months as a result.   Patient says he has a primary care he sees regularly Tejan-sie MD at [facility] - seen last 3- 4 months ago. He reports lab work was normal with his MD last visit.  He denies any liver or kidney disease.  He reports he checks his blood glucose intermittently and is usually around 200, he does report he does not take both of his prescribed medications Invokana and Kombiglyze regularly as prescribed and has been taking them less often with increased stress in his life.    258 lbs weight in February 2018 - per nurse Mariel Sleet RN. He has been on Invokana since 10/22/2016.  Patient  denies any fever, body aches,chills, rash, chest pain, shortness of breath, nausea, vomiting, or diarrhea.   Review of Systems  Constitutional: Positive for activity change (tired more in the past 3 days. He is working today as he just got off night shift. He also reports he has two jobs.  ) and  fatigue. Negative for appetite change, chills, diaphoresis, fever and unexpected weight change.  HENT: Positive for mouth sores and sore throat. Negative for congestion, dental problem, drooling, ear discharge, ear pain, facial swelling, hearing loss, nosebleeds, postnasal drip, rhinorrhea, sinus pressure, sinus pain, sneezing, tinnitus, trouble swallowing and voice change.   Eyes: Negative.   Respiratory: Negative.   Cardiovascular: Negative.   Gastrointestinal: Negative.   Endocrine: Negative for cold intolerance, heat intolerance, polydipsia, polyphagia and polyuria.       Denies   Genitourinary: Negative.   Musculoskeletal: Negative.   Skin: Negative.   Neurological: Negative.   Hematological: Negative.   Psychiatric/Behavioral: Negative.        Objective:   Physical Exam  Constitutional: He is oriented to person, place, and time. He appears well-developed and well-nourished. He is active.  Non-toxic appearance. He does not have a sickly appearance. He does not appear ill. No distress.  Patient is alert and oriented and responsive to questions Engages in eye contact with provider. Speaks in full sentences without any pauses without any shortness of breath or distress.   Patient moves on and off of exam table and in room without difficulty. Gait is normal in hall and in room. Patient is oriented to person place time and situation. Patient answers questions appropriately and engages in conversation.  HENT:  Head: Normocephalic and atraumatic.  Right Ear: Hearing and external ear normal. Tympanic membrane is not perforated and not erythematous. A middle ear effusion is present.  Left Ear: Hearing and external ear normal. Tympanic membrane is not perforated and not erythematous. A middle ear effusion is present.  Nose: Nose normal. No mucosal edema or rhinorrhea. Right sinus exhibits no maxillary sinus tenderness and no frontal sinus tenderness. Left sinus exhibits no maxillary sinus  tenderness and no frontal sinus tenderness.  Mouth/Throat: Uvula is midline and mucous membranes are normal. No uvula swelling. Oropharyngeal exudate present. No posterior oropharyngeal edema, posterior oropharyngeal erythema or tonsillar abscesses.    White exudate / coating on tongue and upper palate and oral pharynx as marked on diagram of  mouth consistent with appearance of yeast.  Not previously present per patient. Present x 4 days.   Eyes: Pupils are equal, round, and reactive to light. Conjunctivae, EOM and lids are normal. Right eye exhibits no discharge. Left eye exhibits no discharge. No scleral icterus.  Neck: Normal range of motion. Neck supple.  Cardiovascular: Normal rate, regular rhythm, normal heart sounds and intact distal pulses. Exam reveals no gallop and no friction rub.  No murmur heard. Pulmonary/Chest: Effort normal and breath sounds normal. No stridor. No respiratory distress. He has no wheezes. He has no rales. He exhibits no tenderness.  Abdominal: Soft. Bowel sounds are normal. He exhibits no distension and no mass. There is no tenderness. There is no rebound and no guarding. No hernia.  Round abdomen   Musculoskeletal: Normal range of motion.  Neurological: He is alert and oriented to person, place, and time.  Skin: Skin is warm and dry. Capillary refill takes less than 2 seconds. No rash noted. He is not diaphoretic. No erythema. There is pallor.  Psychiatric: He has a normal mood and affect. His behavior is normal. Judgment and thought content normal.  Vitals reviewed.      Assessment:     Painful mouth  Thrush, oral - Plan: CMP12+LP+TP+TSH+6AC+PSA+CBC., HgB A1c  Fatigue, unspecified type - Plan: VITAMIN D 25 Hydroxy (Vit-D Deficiency, Fractures)  Abnormal weight loss - Plan: CMP12+LP+TP+TSH+6AC+PSA+CBC.      Plan:     Meds ordered this encounter  Medications  . nystatin (MYCOSTATIN) 100000 UNIT/ML suspension    Sig: Take 5 mLs (500,000 Units  total) by mouth 4 (four) times daily.    Dispense:  60 mL    Refill:  0   Discussed oral thrush and related causes such as recent antibiotics which he declines, hyperglycemia, viral infections and other etiologies. Discussed use of Nystatin mouth wash.Intake of foods with probiotics. Avoiding sugary foods and beverages.  [facility] closed on 11/19/17 - He will go in am of 11/20/17 am for fasting labs at [facility] and is advised to follow up with his PCP recommended this week.   If worsening fatigue or any signs of infection such as fever, chills, abdominal pain, or any worsening/new symptom occurs be seen in the emergency room.     Weight loss  Discussed can be normal side effect of Invokana  however he still needs further evaluation and work up with his PCP given fatigue.  Orders Placed This Encounter  Procedures  . CMP12+LP+TP+TSH+6AC+PSA+CBC.  . HgB A1c  . VITAMIN D 25 Hydroxy (Vit-D Deficiency, Fractures)    Standing Status:   Future    Standing Expiration Date:   12/19/2017  He will start checking his blood glucose in the morning  fasting and again in the evening and keep diet log and blood glucose record for his primary care MD. Take medications as prescribed by PCP and follow up regulary as directed.   He is aware he needs to see PCP tomorrow when office opens after holiday. He will go to emergency room if any symptoms worsen. Avoid sugar in diet and check blood sugar regularly twice daily. Discussed high sugar in Gatorade.   Advised patient call the office or your primary care doctor for an appointment if no improvement within 72 hours or if any symptoms change or worsen at any time  Advised ER or urgent Care if after hours or on weekend. Call 911 for emergency symptoms at any time.Patinet verbalized understanding of all instructions given/reviewed and treatment plan and has no further questions or concerns at this time.    Patient verbalized understanding of all instructions given and denies  any further questions at this time.   9/3 sent all labs to Dr. Sullivan Lone to review and recommendation to follow up with his PCP as soon as possible this week.

## 2017-11-20 ENCOUNTER — Other Ambulatory Visit: Payer: Self-pay | Admitting: Adult Health

## 2017-11-21 ENCOUNTER — Telehealth: Payer: Self-pay | Admitting: Adult Health

## 2017-11-21 ENCOUNTER — Telehealth: Payer: Self-pay | Admitting: *Deleted

## 2017-11-21 LAB — CBC WITH DIFFERENTIAL/PLATELET
BASOS ABS: 0 10*3/uL (ref 0.0–0.2)
Basos: 0 %
EOS (ABSOLUTE): 0.2 10*3/uL (ref 0.0–0.4)
Eos: 3 %
HEMOGLOBIN: 15.4 g/dL (ref 13.0–17.7)
Hematocrit: 44.2 % (ref 37.5–51.0)
Immature Grans (Abs): 0 10*3/uL (ref 0.0–0.1)
Immature Granulocytes: 0 %
LYMPHS ABS: 1.8 10*3/uL (ref 0.7–3.1)
Lymphs: 25 %
MCH: 34.6 pg — AB (ref 26.6–33.0)
MCHC: 34.8 g/dL (ref 31.5–35.7)
MCV: 99 fL — AB (ref 79–97)
MONOCYTES: 10 %
Monocytes Absolute: 0.7 10*3/uL (ref 0.1–0.9)
NEUTROS ABS: 4.4 10*3/uL (ref 1.4–7.0)
Neutrophils: 62 %
PLATELETS: 203 10*3/uL (ref 150–450)
RBC: 4.45 x10E6/uL (ref 4.14–5.80)
RDW: 12.6 % (ref 12.3–15.4)
WBC: 7.2 10*3/uL (ref 3.4–10.8)

## 2017-11-21 LAB — HEPATITIS PANEL, ACUTE
HEP A IGM: NEGATIVE
HEP B C IGM: NEGATIVE
HEP B S AG: NEGATIVE
Hep C Virus Ab: <0.1 s/co ratio (ref 0.0–0.9)

## 2017-11-21 LAB — LIPID PANEL W/O CHOL/HDL RATIO
Cholesterol, Total: 449 mg/dL — ABNORMAL HIGH (ref 100–199)
HDL: 25 mg/dL — AB (ref >39)
Triglycerides: 1163 mg/dL (ref 0–149)

## 2017-11-21 LAB — COMPREHENSIVE METABOLIC PANEL
ALT: 119 IU/L — AB (ref 0–44)
AST: 119 IU/L — ABNORMAL HIGH (ref 0–40)
Albumin/Globulin Ratio: 1.4 (ref 1.2–2.2)
Albumin: 4.2 g/dL (ref 3.5–5.5)
Alkaline Phosphatase: 109 IU/L (ref 39–117)
BUN/Creatinine Ratio: 9 (ref 9–20)
BUN: 9 mg/dL (ref 6–24)
Bilirubin Total: 0.5 mg/dL (ref 0.0–1.2)
CALCIUM: 10.1 mg/dL (ref 8.7–10.2)
CO2: 16 mmol/L — ABNORMAL LOW (ref 20–29)
CREATININE: 1.03 mg/dL (ref 0.76–1.27)
Chloride: 93 mmol/L — ABNORMAL LOW (ref 96–106)
GFR, EST AFRICAN AMERICAN: 97 mL/min/{1.73_m2} (ref >59)
GFR, EST NON AFRICAN AMERICAN: 84 mL/min/{1.73_m2} (ref >59)
GLUCOSE: 371 mg/dL — AB (ref 65–99)
Globulin, Total: 3.1 g/dL (ref 1.5–4.5)
Potassium: 4.3 mmol/L (ref 3.5–5.2)
Sodium: 136 mmol/L (ref 134–144)
TOTAL PROTEIN: 7.3 g/dL (ref 6.0–8.5)

## 2017-11-21 LAB — TSH: TSH: 1.94 u[IU]/mL (ref 0.450–4.500)

## 2017-11-21 LAB — PSA: Prostate Specific Ag, Serum: 1.1 ng/mL (ref 0.0–4.0)

## 2017-11-21 LAB — VITAMIN D 25 HYDROXY (VIT D DEFICIENCY, FRACTURES): VIT D 25 HYDROXY: 10.3 ng/mL — AB (ref 30.0–100.0)

## 2017-11-21 LAB — HGB A1C W/O EAG: Hgb A1c MFr Bld: 12.3 % — ABNORMAL HIGH (ref 4.8–5.6)

## 2017-11-21 NOTE — Telephone Encounter (Signed)
Spoke w/Ashley CNA at [place] office, she confirms lab results were recieved from [facility] 11/20/17. Writer stressed the urgency of need for f/u with Dr.Tejan-Sie d/t lab results and physical appearance of Mr.Albertsen. Morrie Sheldon states she will advise Dr.Tejan-Sie to review labs and states he will call pt to come in for a visit. Morrie Sheldon provided with phone number here at [facility] for Dr. Ellsworth Lennox to call and speak with Smitty Knudsen NP if he has any questions, advised M. Flinchum NP was the provider that saw Mr.Engelhard on Monday 11/19/17. Morrie Sheldon verbalizes understanding of information given and importance of f/u needed for Mr.Brosious and has no further questions.

## 2017-11-21 NOTE — Telephone Encounter (Addendum)
Patient was called 11/21/17 12:40pm and advised of lab results including elevated liver enzymes, severe hypertriglyceridemia, elevated glucose and elevated Hemoglobin A1C all discussed with patient and the relationship to fatigue. Patient was advised of the dangerous lab levels and need for aggressive follow up with his PCP immediately as discussed with supervising MD Julieanne Manson.  Advised to call his primary care physician now for follow up appointment to discuss medications and labs and to develop plan with his primary care MD this week as soon as possible. Patient verbalizes understanding of risks related to these labs and agrees to call. Patient verbalized understanding and he is aware that his PCP has a copy of his labs.  Also refer to telephone note on 11/21/17 as primary care was informed of labs and [facility] CMA informed our nurse they had been received.  Advised patient call the office or your primary care doctor for an appointment if no improvement within 72 hours or if any symptoms change or worsen at any time  Advised ER or urgent Care if after hours or on weekend. Call 911 for emergency symptoms at any time.Patinet verbalized understanding of all instructions given/reviewed and treatment plan and has no further questions or concerns at this time.    Patient verbalized understanding of all instructions given and denies any further questions at this time.   Provider thoroughly discussed in collaboration above plan with supervising physician Dr. Julieanne Manson who is in agreement with the care plan as above. Notes recorded by Julieanne Manson MD to provider - He needs aggressive f/u with PCP. His noncompliance will be an issue. Lifestyle modification imperative due to DM and Trigs above 1000.

## 2017-11-23 ENCOUNTER — Other Ambulatory Visit: Payer: Self-pay

## 2017-11-23 ENCOUNTER — Inpatient Hospital Stay
Admission: EM | Admit: 2017-11-23 | Discharge: 2017-11-25 | DRG: 638 | Disposition: A | Discharge status: Home / Self Care | Payer: BLUE CROSS/BLUE SHIELD | Attending: Internal Medicine | Admitting: Internal Medicine

## 2017-11-23 ENCOUNTER — Encounter: Payer: Self-pay | Admitting: Emergency Medicine

## 2017-11-23 DIAGNOSIS — Z91013 Allergy to seafood: Secondary | ICD-10-CM | POA: Diagnosis not present

## 2017-11-23 DIAGNOSIS — N179 Acute kidney failure, unspecified: Secondary | ICD-10-CM | POA: Diagnosis present

## 2017-11-23 DIAGNOSIS — Z7984 Long term (current) use of oral hypoglycemic drugs: Secondary | ICD-10-CM | POA: Diagnosis not present

## 2017-11-23 DIAGNOSIS — I10 Essential (primary) hypertension: Secondary | ICD-10-CM | POA: Diagnosis present

## 2017-11-23 DIAGNOSIS — E1010 Type 1 diabetes mellitus with ketoacidosis without coma: Secondary | ICD-10-CM | POA: Diagnosis not present

## 2017-11-23 DIAGNOSIS — Z833 Family history of diabetes mellitus: Secondary | ICD-10-CM | POA: Diagnosis not present

## 2017-11-23 DIAGNOSIS — Z79899 Other long term (current) drug therapy: Secondary | ICD-10-CM | POA: Diagnosis not present

## 2017-11-23 DIAGNOSIS — E781 Pure hyperglyceridemia: Secondary | ICD-10-CM | POA: Diagnosis present

## 2017-11-23 DIAGNOSIS — E1069 Type 1 diabetes mellitus with other specified complication: Secondary | ICD-10-CM | POA: Diagnosis present

## 2017-11-23 DIAGNOSIS — Z8249 Family history of ischemic heart disease and other diseases of the circulatory system: Secondary | ICD-10-CM

## 2017-11-23 DIAGNOSIS — E871 Hypo-osmolality and hyponatremia: Secondary | ICD-10-CM | POA: Diagnosis present

## 2017-11-23 DIAGNOSIS — E1110 Type 2 diabetes mellitus with ketoacidosis without coma: Secondary | ICD-10-CM | POA: Diagnosis present

## 2017-11-23 DIAGNOSIS — R531 Weakness: Secondary | ICD-10-CM

## 2017-11-23 DIAGNOSIS — Z9114 Patient's other noncompliance with medication regimen: Secondary | ICD-10-CM | POA: Diagnosis not present

## 2017-11-23 DIAGNOSIS — F17210 Nicotine dependence, cigarettes, uncomplicated: Secondary | ICD-10-CM | POA: Diagnosis present

## 2017-11-23 DIAGNOSIS — Z91018 Allergy to other foods: Secondary | ICD-10-CM | POA: Diagnosis not present

## 2017-11-23 DIAGNOSIS — E785 Hyperlipidemia, unspecified: Secondary | ICD-10-CM | POA: Diagnosis present

## 2017-11-23 HISTORY — DX: Type 2 diabetes mellitus without complications: E11.9

## 2017-11-23 LAB — CBC
HEMATOCRIT: 40.5 % (ref 40.0–52.0)
Hemoglobin: 14.2 g/dL (ref 13.0–18.0)
MCH: 34.9 pg — ABNORMAL HIGH (ref 26.0–34.0)
MCHC: 35.1 g/dL (ref 32.0–36.0)
MCV: 99.4 fL (ref 80.0–100.0)
Platelets: 155 10*3/uL (ref 150–440)
RBC: 4.07 MIL/uL — ABNORMAL LOW (ref 4.40–5.90)
RDW: 12.2 % (ref 11.5–14.5)
WBC: 6.1 10*3/uL (ref 3.8–10.6)

## 2017-11-23 LAB — GLUCOSE, CAPILLARY
GLUCOSE-CAPILLARY: 309 mg/dL — AB (ref 70–99)
GLUCOSE-CAPILLARY: 300 mg/dL — AB (ref 70–99)
Glucose-Capillary: 168 mg/dL — ABNORMAL HIGH (ref 70–99)
Glucose-Capillary: 179 mg/dL — ABNORMAL HIGH (ref 70–99)
Glucose-Capillary: 181 mg/dL — ABNORMAL HIGH (ref 70–99)
Glucose-Capillary: 236 mg/dL — ABNORMAL HIGH (ref 70–99)
Glucose-Capillary: 270 mg/dL — ABNORMAL HIGH (ref 70–99)
Glucose-Capillary: 293 mg/dL — ABNORMAL HIGH (ref 70–99)

## 2017-11-23 LAB — URINALYSIS, COMPLETE (UACMP) WITH MICROSCOPIC
BACTERIA UA: NONE SEEN
Bilirubin Urine: NEGATIVE
Glucose, UA: >=500 mg/dL — AB
HGB URINE DIPSTICK: NEGATIVE
KETONES UR: 80 mg/dL — AB
Leukocytes, UA: NEGATIVE
NITRITE: NEGATIVE
PROTEIN: NEGATIVE mg/dL
Specific Gravity, Urine: 1.038 — ABNORMAL HIGH (ref 1.005–1.030)
Squamous Epithelial / HPF: NONE SEEN (ref 0–5)
WBC UA: NONE SEEN WBC/hpf (ref 0–5)
pH: 6 (ref 5.0–8.0)

## 2017-11-23 LAB — BASIC METABOLIC PANEL
Anion gap: 17 — ABNORMAL HIGH (ref 5–15)
BUN: 10 mg/dL (ref 6–20)
CO2: 19 mmol/L — ABNORMAL LOW (ref 22–32)
Calcium: 9.4 mg/dL (ref 8.9–10.3)
Chloride: 97 mmol/L — ABNORMAL LOW (ref 98–111)
Creatinine, Ser: 0.74 mg/dL (ref 0.61–1.24)
GFR calc Af Amer: >60 mL/min (ref >60)
GFR calc non Af Amer: >60 mL/min (ref >60)
GLUCOSE: 343 mg/dL — AB (ref 70–99)
Potassium: 3.8 mmol/L (ref 3.5–5.1)
Sodium: 133 mmol/L — ABNORMAL LOW (ref 135–145)

## 2017-11-23 LAB — MRSA PCR SCREENING: MRSA by PCR: NEGATIVE

## 2017-11-23 LAB — BETA-HYDROXYBUTYRIC ACID: Beta-Hydroxybutyric Acid: 5.21 mmol/L — ABNORMAL HIGH (ref 0.05–0.27)

## 2017-11-23 MED ORDER — SODIUM CHLORIDE 0.9 % IV SOLN
INTRAVENOUS | Status: DC
  Administered 2017-11-23 – 2017-11-24 (×2): via INTRAVENOUS

## 2017-11-23 MED ORDER — SODIUM CHLORIDE 0.9 % IV SOLN
INTRAVENOUS | Status: AC
  Administered 2017-11-23: 20:00:00 via INTRAVENOUS

## 2017-11-23 MED ORDER — SODIUM CHLORIDE 0.9 % IV SOLN
INTRAVENOUS | Status: DC
  Administered 2017-11-23: 7 [IU]/h via INTRAVENOUS
  Filled 2017-11-23: qty 1

## 2017-11-23 MED ORDER — POTASSIUM CHLORIDE 10 MEQ/100ML IV SOLN
10.0000 meq | INTRAVENOUS | Status: AC
  Administered 2017-11-23 (×2): 10 meq via INTRAVENOUS
  Filled 2017-11-23 (×2): qty 100

## 2017-11-23 MED ORDER — DEXTROSE-NACL 5-0.45 % IV SOLN: INTRAVENOUS | Status: DC

## 2017-11-23 MED ORDER — SODIUM CHLORIDE 0.9 % IV BOLUS
1000.0000 mL | Freq: Once | INTRAVENOUS | Status: AC
  Administered 2017-11-23: 1000 mL via INTRAVENOUS

## 2017-11-23 MED ORDER — DEXTROSE-NACL 5-0.45 % IV SOLN
INTRAVENOUS | Status: DC
  Administered 2017-11-23: 22:00:00 via INTRAVENOUS

## 2017-11-23 MED ORDER — SODIUM CHLORIDE 0.9 % IV SOLN
INTRAVENOUS | Status: DC
  Administered 2017-11-23: 2.4 [IU]/h via INTRAVENOUS
  Filled 2017-11-23: qty 1

## 2017-11-23 MED ORDER — SODIUM CHLORIDE 0.9 % IV SOLN
INTRAVENOUS | Status: DC | PRN
  Administered 2017-11-23: 500 mL via INTRAVENOUS

## 2017-11-23 MED ORDER — ENOXAPARIN SODIUM 40 MG/0.4ML ~~LOC~~ SOLN
40.0000 mg | SUBCUTANEOUS | Status: DC
  Administered 2017-11-23 – 2017-11-24 (×2): 40 mg via SUBCUTANEOUS
  Filled 2017-11-23 (×2): qty 0.4

## 2017-11-23 NOTE — ED Provider Notes (Signed)
[facility] Emergency Department Provider Note  ____________________________________________  Time seen: Approximately 5:58 PM  I have reviewed the triage vital signs and the nursing notes.   HISTORY  Chief Complaint No chief complaint on file.    HPI Randy Deleon is a 52 y.o. male with a history of diabetes and hypertension who sent to the ED today due to concern for DKA.  Patient reports he has not been taking his medication including glipizide and Invokana for the past 2 months.  He has had worsening polyuria, weight loss, generalized weakness.  Denies any pain vomiting or diarrhea.  No fevers or chills. Sx are constant, no aggravating or alleviating factors.      Past Medical History:  Diagnosis Date  . Diabetes mellitus without complication [facility])      Patient Active Problem List   Diagnosis Date Noted  . Fatigue 11/19/2017  . Abnormal weight loss 11/19/2017     History reviewed. No pertinent surgical history.   Prior to Admission medications   Medication Sig Start Date End Date Taking? Authorizing Provider  carvedilol (COREG) 25 MG tablet Take 25 mg by mouth 2 (two) times daily. 11/07/17  Yes [provider]  amLODipine-benazepril (LOTREL) 10-20 MG capsule TAKE 1 CAPSULE BY MOUTH EVERY DAY IN THE MORNING 01/11/17   [provider]  busPIRone (BUSPAR) 7.5 MG tablet Take 7.5 mg 2 (two) times daily by mouth. 11/28/16   [provider]  [employer] D3 5000 units capsule 1 EVERY MORNING START AFTER COMPLETION OF 16109 UNITS THERAPY 11/28/16   [provider]  fenofibrate 160 MG tablet Take 160 mg daily by mouth. 11/22/16   [provider]  INVOKANA 300 MG TABS tablet TAKE 1 TABLET BY MOUTH EVERY DAY IN THE MORNING 01/11/17   [provider]  KOMBIGLYZE XR 07-998 MG TB24 Take 1 tablet 2 (two) times daily by mouth. 11/22/16   [provider]  nystatin (MYCOSTATIN) 100000 UNIT/ML suspension Take 5 mLs  (500,000 Units total) by mouth 4 (four) times daily. 11/19/17   Flinchum, Eula Fried, FNP  rosuvastatin (CRESTOR) 10 MG tablet Take 10 mg daily by mouth. 01/10/17   [provider]  sildenafil (VIAGRA) 100 MG tablet AS NEEDED DAILY 30 MIN TO 4HR BEFORE INTERCOURSE 01/10/17   [provider]     Allergies Other and Shellfish allergy   Family History  Problem Relation Age of Onset  . Diabetes Mother   . Hypertension Mother   . Congestive Heart Failure Father   . Diabetes Father   . Hypertension Father     Social History Social History   Tobacco Use  . Smoking status: Current Every Day Smoker    Packs/day: 0.75    Years: 20.00    Pack years: 15.00    Types: Cigarettes    Start date: 01/29/1997  . Smokeless tobacco: Never Used  . Tobacco comment: Refer to [facility]  Substance Use Topics  . Alcohol use: Not Currently  . Drug use: Not Currently    Review of Systems  Constitutional:   No fever or chills.  ENT:   No sore throat. No rhinorrhea. Cardiovascular:   No chest pain or syncope. Respiratory:   No dyspnea or cough. Gastrointestinal:   Negative for abdominal pain, vomiting and diarrhea.  Musculoskeletal:   Negative for focal pain or swelling All other systems reviewed and are negative except as documented above in ROS and HPI.  ____________________________________________   PHYSICAL EXAM:  VITAL SIGNS: ED Triage Vitals  Enc Vitals Group     BP 11/23/17 1605 118/73     Pulse Rate 11/23/17 1605 91     Resp 11/23/17 1605 16     Temp 11/23/17 1605 98.4 F (36.9 C)     Temp Source 11/23/17 1605 Oral     SpO2 11/23/17 1605 98 %     Weight 11/23/17 1608 222 lb (100.7 kg)     Height 11/23/17 1608 5\' 9"  (1.753 m)     Head Circumference --      Peak Flow --      Pain Score 11/23/17 1608 0     Pain Loc --      Pain Edu? --      Excl. in GC? --     Vital signs reviewed, nursing assessments reviewed.   Constitutional:   Alert and oriented.  Non-toxic appearance. Eyes:   Conjunctivae are normal. EOMI. PERRL. ENT      Head:   Normocephalic and atraumatic.      Nose:   No congestion/rhinnorhea.       Mouth/Throat:   Dry mucous membranes, no pharyngeal erythema. No peritonsillar mass.       Neck:   No meningismus. Full ROM. Hematological/Lymphatic/Immunilogical:   No cervical lymphadenopathy. Cardiovascular:   RRR. Symmetric bilateral radial and DP pulses.  No murmurs. Cap refill less than 2 seconds. Respiratory:   Normal respiratory effort without tachypnea/retractions. Breath sounds are clear and equal bilaterally. No wheezes/rales/rhonchi. Gastrointestinal:   Soft and nontender. Non distended. There is no CVA tenderness.  No rebound, rigidity, or guarding. Musculoskeletal:   Normal range of motion in all extremities. No joint effusions.  No lower extremity tenderness.  No edema. Neurologic:   Normal speech and language.  Motor grossly intact. No acute focal neurologic deficits are appreciated.  Skin:    Skin is warm, dry and intact. No rash noted.  No petechiae, purpura, or bullae.  ____________________________________________    LABS (pertinent positives/negatives) (all labs ordered are listed, but only abnormal results are displayed) Labs Reviewed  GLUCOSE, CAPILLARY - Abnormal; Notable for the following components:      Result Value   Glucose-Capillary 309 (*)    All other components within normal limits  BASIC METABOLIC PANEL - Abnormal; Notable for the following components:   Sodium 133 (*)    Chloride 97 (*)    CO2 19 (*)    Glucose, Bld 343 (*)    Anion gap 17 (*)    All other components within normal limits  CBC - Abnormal; Notable for the following components:   RBC 4.07 (*)    MCH 34.9 (*)    All other components within normal limits  URINALYSIS, COMPLETE (UACMP) WITH MICROSCOPIC - Abnormal; Notable for the following components:   Color, Urine YELLOW (*)    APPearance CLEAR (*)    Specific Gravity,  Urine 1.038 (*)    Glucose, UA >=500 (*)    Ketones, ur 80 (*)    All other components within normal limits  GLUCOSE, CAPILLARY - Abnormal; Notable for the following components:   Glucose-Capillary 300 (*)    All other components within normal limits  BETA-HYDROXYBUTYRIC ACID  CBG MONITORING, ED   ____________________________________________   EKG    ____________________________________________    RADIOLOGY  No results found.  ____________________________________________   PROCEDURES .Critical Care Performed by: Sharman Cheek, MD Authorized by: Sharman Cheek, MD   Critical care provider statement:  Critical care time (minutes):  30   Critical care time was exclusive of:  Separately billable procedures and treating other patients   Critical care was necessary to treat or prevent imminent or life-threatening deterioration of the following conditions:  Endocrine crisis and dehydration   Critical care was time spent personally by me on the following activities:  Development of treatment plan with patient or surrogate, discussions with consultants, evaluation of patient's response to treatment, examination of patient, obtaining history from patient or surrogate, ordering and performing treatments and interventions, ordering and review of laboratory studies, ordering and review of radiographic studies, pulse oximetry, re-evaluation of patient's condition and review of old charts    ____________________________________________    CLINICAL IMPRESSION / ASSESSMENT AND PLAN / ED COURSE  Pertinent labs & imaging results that were available during my care of the patient were reviewed by me and considered in my medical decision making (see chart for details).    Patient presents with weight loss generalized weakness and hyperglycemia.  Labs show metabolic acidosis and ketones in the urine.  Elevated anion gap.  Consistent with diabetic ketoacidosis.  Start the patient on  saline bolus, insulin infusion.  Patient will need prolonged treatment with insulin infusion, likely dextrose infusion to close the gap, and hydration and restarting his medications.  I will discuss with the hospitalist for further management.      ____________________________________________   FINAL CLINICAL IMPRESSION(S) / ED DIAGNOSES    Final diagnoses:  Generalized weakness  Diabetic ketoacidosis without coma associated with type 2 diabetes mellitus [facility])     ED Discharge Orders    None      Portions of this note were generated with dragon dictation software. Dictation errors may occur despite best attempts at proofreading.    Sharman Cheek, MD 11/23/17 [phone]

## 2017-11-23 NOTE — ED Notes (Signed)
Pt c/o generalized weakness

## 2017-11-23 NOTE — ED Triage Notes (Signed)
Pt reports going to PCP because he hasn't felt well x 3 weeks. Pt's PCP told him to come to ER to be treated for "mild DKA with electrolyte monitoring and IV insulin." Reports weakness for last three weeks. Denies N/V/D.   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   Pt dx DM type 2 x approx 10 years ago. Pt does not take insulin at home. Takes Invokana.

## 2017-11-23 NOTE — H&P (Signed)
[facility] - [place] at [facility]   PATIENT NAME: Randy Deleon    MR#:  [id number]  DATE OF BIRTH:  07-17-1965  DATE OF ADMISSION:  11/23/2017  PRIMARY CARE PHYSICIAN: Sherron Monday, MD   REQUESTING/REFERRING PHYSICIAN: Dr. Scotty Court  CHIEF COMPLAINT:  generalized weakness and overall not feeling well Sent by primary care physician for evaluation in the ER  HISTORY OF PRESENT ILLNESS:  Randy Deleon  is a 52 y.o. Deleon with a known history of type II diabetes not on insulin, hyperlipidemia/hypertriglyceridemia, hypertension comes to the emergency room with not feeling well along with weakness malaise and fatigue ability. Patient has not been taking his invokana and glipizide for last two months. States he is been taking his statins and hypertensive meds. He is not been checking his sugars regularly. He reports being under a lot of stress at work and with daughter at home.  Was found to be in DKA and the emergency room. He is receiving IV fluids and started on IV insulin drip. hemoGlobin A1c is 12.3.  PAST MEDICAL HISTORY:   Past Medical History:  Diagnosis Date  . Diabetes mellitus without complication (HCC)     PAST SURGICAL HISTOIRY:  History reviewed. No pertinent surgical history.  SOCIAL HISTORY:   Social History   Tobacco Use  . Smoking status: Current Every Day Smoker    Packs/day: 0.75    Years: 20.00    Pack years: 15.00    Types: Cigarettes    Start date: 01/29/1997  . Smokeless tobacco: Never Used  . Tobacco comment: Refer to [facility]  Substance Use Topics  . Alcohol use: Not Currently    FAMILY HISTORY:   Family History  Problem Relation Age of Onset  . Diabetes Mother   . Hypertension Mother   . Congestive Heart Failure Father   . Diabetes Father   . Hypertension Father     DRUG ALLERGIES:   Allergies  Allergen Reactions  . Other     Lima beans  Per testing.  . Shellfish Allergy      Cannot recall  reaction,Per your allergy testing showed that a allergy to shellfish.     REVIEW OF SYSTEMS:  Review of Systems  Constitutional: Positive for malaise/fatigue. Negative for chills, fever and weight loss.  HENT: Negative for ear discharge, ear pain and nosebleeds.   Eyes: Negative for blurred vision, pain and discharge.  Respiratory: Negative for sputum production, shortness of breath, wheezing and stridor.   Cardiovascular: Negative for chest pain, palpitations, orthopnea and PND.  Gastrointestinal: Negative for abdominal pain, diarrhea, nausea and vomiting.  Genitourinary: Negative for frequency and urgency.  Musculoskeletal: Negative for back pain and joint pain.  Neurological: Positive for weakness. Negative for sensory change, speech change and focal weakness.  Psychiatric/Behavioral: Negative for depression and hallucinations. The patient is not nervous/anxious.      MEDICATIONS AT HOME:   Prior to Admission medications   Medication Sig Start Date End Date Taking? Authorizing Provider  carvedilol (COREG) 25 MG tablet Take 25 mg by mouth 2 (two) times daily. 11/07/17  Yes [provider]  amLODipine-benazepril (LOTREL) 10-20 MG capsule TAKE 1 CAPSULE BY MOUTH EVERY DAY IN THE MORNING 01/11/17   [provider]  busPIRone (BUSPAR) 7.5 MG tablet Take 7.5 mg 2 (two) times daily by mouth. 11/28/16   [provider]  [employer] D3 5000 units capsule 1 EVERY MORNING START AFTER COMPLETION OF 40981 UNITS THERAPY 11/28/16   [provider]  fenofibrate 160 MG tablet Take 160 mg daily by mouth. 11/22/16   [provider]  INVOKANA 300 MG TABS tablet TAKE 1 TABLET BY MOUTH EVERY DAY IN THE MORNING 01/11/17   [provider]  KOMBIGLYZE XR 07-998 MG TB24 Take 1 tablet 2 (two) times daily by mouth. 11/22/16   [provider]  nystatin (MYCOSTATIN) 100000 UNIT/ML suspension Take 5 mLs (500,000 Units total) by mouth 4 (four) times daily. 11/19/17    Flinchum, Eula Fried, FNP  rosuvastatin (CRESTOR) 10 MG tablet Take 10 mg daily by mouth. 01/10/17   [provider]  sildenafil (VIAGRA) 100 MG tablet AS NEEDED DAILY 30 MIN TO 4HR BEFORE INTERCOURSE 01/10/17   [provider]      VITAL SIGNS:  Blood pressure 118/73, pulse 91, temperature 98.4 F (36.9 C), temperature source Oral, resp. rate 16, height 5\' 9"  (1.753 m), weight 100.7 kg, SpO2 98 %.  PHYSICAL EXAMINATION:  GENERAL:  52 y.o.-year-old patient lying in the bed with no acute distress.  EYES: Pupils equal, round, reactive to light and accommodation. No scleral icterus. Extraocular muscles intact.  HEENT: Head atraumatic, normocephalic. Oropharynx and nasopharynx clear.  NECK:  Supple, no jugular venous distention. No thyroid enlargement, no tenderness.  LUNGS: Normal breath sounds bilaterally, no wheezing, rales,rhonchi or crepitation. No use of accessory muscles of respiration.  CARDIOVASCULAR: S1, S2 normal. No murmurs, rubs, or gallops.  ABDOMEN: Soft, nontender, nondistended. Bowel sounds present. No organomegaly or mass.  EXTREMITIES: No pedal edema, cyanosis, or clubbing.  NEUROLOGIC: Cranial nerves II through XII are intact. Muscle strength 5/5 in all extremities. Sensation intact. Gait not checked.  PSYCHIATRIC: The patient is alert and oriented x 3.  SKIN: No obvious rash, lesion, or ulcer.   LABORATORY PANEL:   CBC Recent Labs  Lab 11/23/17 1612  WBC 6.1  HGB 14.2  HCT 40.5  PLT 155   ------------------------------------------------------------------------------------------------------------------  Chemistries  Recent Labs  Lab 11/20/17 0909 11/23/17 1612  NA 136 133*  K 4.3 3.8  CL 93* 97*  CO2 16* 19*  GLUCOSE 371* 343*  BUN 9 10  CREATININE 1.03 0.74  CALCIUM 10.1 9.4  AST 119*  --   ALT 119*  --   ALKPHOS 109  --   BILITOT 0.5  --     ------------------------------------------------------------------------------------------------------------------  Cardiac Enzymes No results for input(s): TROPONINI in the last 168 hours. ------------------------------------------------------------------------------------------------------------------  RADIOLOGY:  No results found.  EKG:    IMPRESSION AND PLAN:   Randy Deleon  is a 52 y.o. Deleon with a known history of type II diabetes not on insulin, hyperlipidemia/hypertriglyceridemia, hypertension comes to the emergency room with not feeling well along with weakness malaise and fatigue ability. Patient has not been taking his invokana and glipizide for last two months. States he is been taking his statins and hypertensive meds. He is not been checking his sugars regularly. He reports being under a lot of stress at work and with daughter at home.  1. DKA and type II diabetes -noncompliant to meds for last two months -admit step down ICU -discussed with ICU attending Dr.Samaan -IV fluids, metabolic panel Q4 -follow ICU DKA protocol -A1c 12.3 -diabetes coordinator to see patient  2. hypertension continue home  3. Hyponatremia and acute renal failure due to diabetes ketoacidosis -continue above supportive care  4. Hypertriglyceridemia/hyperlipidemia -continue statins and fenofibrate's  5. DVT prophylaxis -Lovenox subcut daily  No family in the ER  All the records are reviewed  and case discussed with ED provider. Management plans discussed with the patient, family and they are in agreement.  CODE STATUS: full  TOTAL critical TIME TAKING CARE OF THIS PATIENT: *50* minutes.    Enedina Finner M.D on 11/23/2017 at 6:37 PM  Between 7am to 6pm - Pager - [phone]  After 6pm go to [email or web address] - password [email or web address] [facility]  [facility]  Office  [phone]  CC: Primary care physician; Sherron Monday, MD

## 2017-11-23 NOTE — ED Notes (Signed)
Pt ambulatory from triage to ED14. Bernette Redbird, RN aware.

## 2017-11-24 LAB — BASIC METABOLIC PANEL
ANION GAP: 8 (ref 5–15)
ANION GAP: 10 (ref 5–15)
Anion gap: 12 (ref 5–15)
BUN: 7 mg/dL (ref 6–20)
BUN: 7 mg/dL (ref 6–20)
BUN: 8 mg/dL (ref 6–20)
CALCIUM: 8.6 mg/dL — AB (ref 8.9–10.3)
CALCIUM: 9.1 mg/dL (ref 8.9–10.3)
CALCIUM: 8.8 mg/dL — AB (ref 8.9–10.3)
CO2: 22 mmol/L (ref 22–32)
CO2: 20 mmol/L — ABNORMAL LOW (ref 22–32)
CO2: 21 mmol/L — AB (ref 22–32)
Chloride: 102 mmol/L (ref 98–111)
Chloride: 106 mmol/L (ref 98–111)
Chloride: 106 mmol/L (ref 98–111)
Creatinine, Ser: 0.6 mg/dL — ABNORMAL LOW (ref 0.61–1.24)
Creatinine, Ser: 0.6 mg/dL — ABNORMAL LOW (ref 0.61–1.24)
Creatinine, Ser: 0.66 mg/dL (ref 0.61–1.24)
GFR calc Af Amer: >60 mL/min (ref >60)
GFR calc non Af Amer: >60 mL/min (ref >60)
GLUCOSE: 286 mg/dL — AB (ref 70–99)
Glucose, Bld: 159 mg/dL — ABNORMAL HIGH (ref 70–99)
Glucose, Bld: 167 mg/dL — ABNORMAL HIGH (ref 70–99)
POTASSIUM: 4.1 mmol/L (ref 3.5–5.1)
Potassium: 3.3 mmol/L — ABNORMAL LOW (ref 3.5–5.1)
Potassium: 3.7 mmol/L (ref 3.5–5.1)
SODIUM: 137 mmol/L (ref 135–145)
Sodium: 136 mmol/L (ref 135–145)
Sodium: 134 mmol/L — ABNORMAL LOW (ref 135–145)

## 2017-11-24 LAB — GLUCOSE, CAPILLARY
GLUCOSE-CAPILLARY: 113 mg/dL — AB (ref 70–99)
GLUCOSE-CAPILLARY: 208 mg/dL — AB (ref 70–99)
Glucose-Capillary: 115 mg/dL — ABNORMAL HIGH (ref 70–99)
Glucose-Capillary: 131 mg/dL — ABNORMAL HIGH (ref 70–99)
Glucose-Capillary: 141 mg/dL — ABNORMAL HIGH (ref 70–99)
Glucose-Capillary: 270 mg/dL — ABNORMAL HIGH (ref 70–99)
Glucose-Capillary: 294 mg/dL — ABNORMAL HIGH (ref 70–99)
Glucose-Capillary: 332 mg/dL — ABNORMAL HIGH (ref 70–99)

## 2017-11-24 LAB — MAGNESIUM: Magnesium: 1.7 mg/dL (ref 1.7–2.4)

## 2017-11-24 LAB — PHOSPHORUS: Phosphorus: 3.1 mg/dL (ref 2.5–4.6)

## 2017-11-24 MED ORDER — LIVING WELL WITH DIABETES BOOK
Freq: Once | Status: DC
  Filled 2017-11-24: qty 1

## 2017-11-24 MED ORDER — DOCUSATE SODIUM 100 MG PO CAPS
100.0000 mg | ORAL_CAPSULE | Freq: Two times a day (BID) | ORAL | Status: DC
  Administered 2017-11-24 – 2017-11-25 (×2): 100 mg via ORAL
  Filled 2017-11-24 (×3): qty 1

## 2017-11-24 MED ORDER — INSULIN GLARGINE 100 UNIT/ML ~~LOC~~ SOLN
15.0000 [IU] | Freq: Every day | SUBCUTANEOUS | Status: DC
  Administered 2017-11-24: 15 [IU] via SUBCUTANEOUS
  Filled 2017-11-24 (×2): qty 0.15

## 2017-11-24 MED ORDER — INSULIN GLARGINE 100 UNIT/ML ~~LOC~~ SOLN
10.0000 [IU] | Freq: Every day | SUBCUTANEOUS | Status: DC
  Administered 2017-11-24: 10 [IU] via SUBCUTANEOUS
  Filled 2017-11-24 (×2): qty 0.1

## 2017-11-24 MED ORDER — INFLUENZA VAC SPLIT QUAD 0.5 ML IM SUSY
0.5000 mL | PREFILLED_SYRINGE | INTRAMUSCULAR | Status: DC
  Filled 2017-11-24: qty 0.5

## 2017-11-24 MED ORDER — POTASSIUM CHLORIDE 20 MEQ PO PACK
40.0000 meq | PACK | Freq: Once | ORAL | Status: AC
  Administered 2017-11-24: 40 meq via ORAL

## 2017-11-24 MED ORDER — CARVEDILOL 25 MG PO TABS
25.0000 mg | ORAL_TABLET | Freq: Two times a day (BID) | ORAL | Status: DC
  Administered 2017-11-24 – 2017-11-25 (×3): 25 mg via ORAL
  Filled 2017-11-24 (×3): qty 1

## 2017-11-24 MED ORDER — INSULIN ASPART 100 UNIT/ML ~~LOC~~ SOLN
0.0000 [IU] | Freq: Every day | SUBCUTANEOUS | Status: DC
  Administered 2017-11-24: 22:00:00 3 [IU] via SUBCUTANEOUS
  Filled 2017-11-24: qty 1

## 2017-11-24 MED ORDER — PNEUMOCOCCAL VAC POLYVALENT 25 MCG/0.5ML IJ INJ
0.5000 mL | INJECTION | INTRAMUSCULAR | Status: DC
  Filled 2017-11-24: qty 0.5

## 2017-11-24 MED ORDER — NYSTATIN 100000 UNIT/ML MT SUSP
5.0000 mL | Freq: Four times a day (QID) | OROMUCOSAL | Status: DC
  Administered 2017-11-24 – 2017-11-25 (×4): 500000 [IU] via ORAL
  Filled 2017-11-24 (×4): qty 5

## 2017-11-24 MED ORDER — INSULIN ASPART 100 UNIT/ML ~~LOC~~ SOLN
3.0000 [IU] | Freq: Three times a day (TID) | SUBCUTANEOUS | Status: DC
  Administered 2017-11-24 (×2): 3 [IU] via SUBCUTANEOUS
  Filled 2017-11-24 (×2): qty 1

## 2017-11-24 MED ORDER — POLYETHYLENE GLYCOL 3350 17 G PO PACK
17.0000 g | PACK | Freq: Every day | ORAL | Status: DC | PRN
  Administered 2017-11-24: 12:00:00 17 g via ORAL
  Filled 2017-11-24: qty 1

## 2017-11-24 MED ORDER — INSULIN STARTER KIT- PEN NEEDLES (ENGLISH)
1.0000 | Freq: Once | Status: DC
  Filled 2017-11-24: qty 1

## 2017-11-24 MED ORDER — FENOFIBRATE 160 MG PO TABS
160.0000 mg | ORAL_TABLET | Freq: Every day | ORAL | Status: DC
  Administered 2017-11-24: 160 mg via ORAL
  Filled 2017-11-24 (×2): qty 1

## 2017-11-24 MED ORDER — POTASSIUM CHLORIDE 20 MEQ PO PACK
40.0000 meq | PACK | Freq: Once | ORAL | Status: DC
  Filled 2017-11-24: qty 2

## 2017-11-24 MED ORDER — ROSUVASTATIN CALCIUM 10 MG PO TABS
10.0000 mg | ORAL_TABLET | Freq: Every day | ORAL | Status: DC
  Administered 2017-11-24 – 2017-11-25 (×2): 10 mg via ORAL
  Filled 2017-11-24 (×2): qty 1

## 2017-11-24 MED ORDER — INSULIN ASPART 100 UNIT/ML ~~LOC~~ SOLN
0.0000 [IU] | Freq: Three times a day (TID) | SUBCUTANEOUS | Status: DC
  Administered 2017-11-24: 11 [IU] via SUBCUTANEOUS
  Administered 2017-11-24: 18:00:00 8 [IU] via SUBCUTANEOUS
  Administered 2017-11-24: 5 [IU] via SUBCUTANEOUS
  Administered 2017-11-25: 15 [IU] via SUBCUTANEOUS
  Filled 2017-11-24 (×4): qty 1

## 2017-11-24 NOTE — Progress Notes (Addendum)
Inpatient Diabetes Program Recommendations  AACE/ADA: New Consensus Statement on Inpatient Glycemic Control (2015)  Target Ranges:  Prepandial:   less than 140 mg/dL      Peak postprandial:   less than 180 mg/dL (1-2 hours)      Critically ill patients:  140 - 180 mg/dL   Lab Results  Component Value Date   GLUCAP 332 (H) 11/24/2017   HGBA1C 12.3 (H) 11/20/2017    Review of Glycemic Control  Diabetes history: DM 2 Outpatient Diabetes medications: Invokana 300 mg Daily, Kombiglyze 07-998 mg BID Current orders for Inpatient glycemic control: Lantus 15 units, Novolog 0-15 units tid, Novolog 0-5 units qhs, Novolog 3 units tid meal coverage  Inpatient Diabetes Program Recommendations:    Patient with high triglycerides in addition to DKA and other elevated labs.  Patient has been on invokana, an SGLT2 inhibitor, which is linked to DKA in patients with Type 2 DM. Patient has been on this medication since 10/22/2016, however it would be hard to discount this medication. Would not continue this medication at discharge till he follows up with his PCP.  Noted A1c level 12.3% from PCP office. Due to elevated A1c and triglyceride level patient would benefit from insulin at this point.   Noted patient has Nurse, learning disability and would be able to get Lantus solostar insulin pen for $0/month copay, when they go online and fill out form for copay card.  Will attach step by step instructions on how to operate an insulin pen.  Will try to touch base with patient over the phone this weekend to discuss DM, A1c and insulin pen. Consider a dietitian consult for diet education if needed.  Consider Outpatient DM education at the lifestyle center as well for more focused education and follow up.  Thanks,  Christena Deem RN, MSN, BC-ADM Inpatient Diabetes Coordinator Team Pager [phone] (8a-5p)

## 2017-11-24 NOTE — Consult Note (Signed)
PULMONARY / CRITICAL CARE MEDICINE   Name: Randy Deleon MRN: [id number] DOB: [birth year]    ADMISSION DATE:  11/23/2017   CONSULTATION DATE:  11/23/2017  REFERRING MD: Allena Katz  Chief complaint: DKA  HISTORY OF PRESENT ILLNESS:   This is a 52 year old African-American male with a history of type 2 diabetes who presented to the ED from his primary care physician's office with complaints of nonadherence to diabetic regimen and not feeling well.  He was found to be severely hyperglycemic and admitted for DKA management.  And is on glipizide and Invokana at home.  He reports not taking his medications for the blast 2 weeks.  He reports significant polyuria weight loss and generalized weakness.  He denies chest pain palpitations nausea and vomiting.  PAST MEDICAL HISTORY :  He  has a past medical history of Diabetes mellitus without complication (HCC).  PAST SURGICAL HISTORY: He  has no past surgical history on file.  Allergies  Allergen Reactions  . Other     Lima beans  Per testing.  . Shellfish Allergy      Cannot recall reaction,Per your allergy testing showed that a allergy to shellfish.     No current facility-administered medications on file prior to encounter.    Current Outpatient Medications on File Prior to Encounter  Medication Sig  . carvedilol (COREG) 25 MG tablet Take 25 mg by mouth 2 (two) times daily.  . CVS D3 5000 units capsule Take 5,000 Units by mouth daily.   . fenofibrate 160 MG tablet Take 160 mg daily by mouth.  . INVOKANA 300 MG TABS tablet Take 300 mg by mouth daily before breakfast.   . KOMBIGLYZE XR 07-998 MG TB24 Take 1 tablet 2 (two) times daily by mouth.  . nystatin (MYCOSTATIN) 100000 UNIT/ML suspension Take 5 mLs (500,000 Units total) by mouth 4 (four) times daily.  . rosuvastatin (CRESTOR) 10 MG tablet Take 10 mg daily by mouth.  . sildenafil (VIAGRA) 100 MG tablet Take 100 mg by mouth as needed for erectile dysfunction.     FAMILY HISTORY:  His  family history includes Congestive Heart Failure in his father; Diabetes in his father and mother; Hypertension in his father and mother.  SOCIAL HISTORY: He  reports that he has been smoking cigarettes. He started smoking about 20 years ago. He has a 15.00 pack-year smoking history. He has never used smokeless tobacco. He reports that he drank alcohol. He reports that he has current or past drug history.  REVIEW OF SYSTEMS:   Constitutional: Negative for fever and chills but positive for generalized malaise and fatigue.  HENT: Negative for congestion and rhinorrhea.  Eyes: Negative for redness and visual disturbance.  Respiratory: Negative for shortness of breath and wheezing.  Cardiovascular: Negative for chest pain and palpitations.  Gastrointestinal: Negative  for nausea , vomiting and abdominal pain and  Loose stools Genitourinary: Negative for dysuria and urgency.  Endocrine: Denies polyuria, polyphagia and heat intolerance Musculoskeletal: Negative for myalgias and arthralgias.  Skin: Negative for pallor and wound.  Neurological: Negative for dizziness and headaches but positive for weakness  SUBJECTIVE:   VITAL SIGNS: BP 128/73   Pulse 81   Temp 98.4 F (36.9 C) (Oral)   Resp 16   Ht 5\' 9"  (1.753 m)   Wt 100.7 kg   SpO2 98%   BMI 32.78 kg/m   HEMODYNAMICS:    VENTILATOR SETTINGS:    INTAKE / OUTPUT: I/O last 3 completed shifts: In: 1100 [IV  Piggyback:1100] Out: -   PHYSICAL EXAMINATION: General: Pleasant, no acute distress Neuro: Alert and oriented x4, no focal deficits HEENT: PERRLA, trachea midline, no JVD Cardiovascular: Apical pulse regular, S1-S2, no murmur regurg or gallop, +2 pulses bilaterally, no edema Lungs: Clear to auscultation bilaterally Abdomen: Nontender, nondistended, normal bowel sounds in all 4 quadrants Musculoskeletal: No joint deformities, positive range of motion Skin: Warm and dry  LABS:  BMET Recent Labs  Lab 11/20/17 0909  11/23/17 1612 11/23/17 2354  NA 136 133* 137  K 4.3 3.8 3.3*  CL 93* 97* 106  CO2 16* 19* 21*  BUN 9 10 8   CREATININE 1.03 0.74 0.60*  GLUCOSE 371* 343* 159*    Electrolytes Recent Labs  Lab 11/20/17 0909 11/23/17 1612 11/23/17 2354  CALCIUM 10.1 9.4 8.8*    CBC Recent Labs  Lab 11/20/17 0909 11/23/17 1612  WBC 7.2 6.1  HGB 15.4 14.2  HCT 44.2 40.5  PLT 203 155    Coag's No results for input(s): APTT, INR in the last 168 hours.  Sepsis Markers No results for input(s): LATICACIDVEN, PROCALCITON, O2SATVEN in the last 168 hours.  ABG No results for input(s): PHART, PCO2ART, PO2ART in the last 168 hours.  Liver Enzymes Recent Labs  Lab 11/20/17 0909  AST 119*  ALT 119*  ALKPHOS 109  BILITOT 0.5  ALBUMIN 4.2    Cardiac Enzymes No results for input(s): TROPONINI, PROBNP in the last 168 hours.  Glucose Recent Labs  Lab 11/23/17 1933 11/23/17 2012 11/23/17 2132 11/23/17 2235 11/23/17 2339 11/24/17 0038  GLUCAP 270* 236* 179* 181* 168* 131*    Imaging No results found.  SIGNIFICANT EVENTS: 11/23/2017: Admitted  LINES/TUBES: Peripheral IV  DISCUSSION: 52 year old male admitted with diabetic ketoacidosis due to nonadherence  ASSESSMENT  Diabetic ketoacidosis secondary to non-adherence Uncontrolled type 2 diabetes with other complications-hyperlipidemia Hyperlipidemia associated with type 2 diabetes  PLAN DKA protocol and transition to subcu insulin as tolerated Consult to diabetes coordinator Patient advised to follow-up with PCP regarding lipid-lowering medications GI and DVT prophylaxis  FAMILY  - Updates: Family at bedside.  Patient updated on current treatment plan  Malvina Schadler S. [facility] ANP-BC Pulmonary and Critical Care Medicine [facility] Pager [phone] or [phone]  NB: This document was prepared using Dragon voice recognition software and may include unintentional dictation errors.   11/24/2017, 1:14 AM

## 2017-11-24 NOTE — Progress Notes (Signed)
Maggie, NP notified of anion at 10, CO2 21 and potassium at 3.3. See new orders entered.

## 2017-11-24 NOTE — Progress Notes (Addendum)
[facility] - [place] at [facility]   PATIENT NAME: Randy Deleon    MR#:  [id number]  DATE OF BIRTH:  [birth year]  SUBJECTIVE:  CHIEF COMPLAINT:  No chief complaint on file.  The patient has no complaints. REVIEW OF SYSTEMS:  Review of Systems  Constitutional: Negative for chills, fever and malaise/fatigue.  HENT: Negative for sore throat.   Eyes: Negative for blurred vision and double vision.  Respiratory: Negative for cough, hemoptysis, shortness of breath, wheezing and stridor.   Cardiovascular: Negative for chest pain, palpitations, orthopnea and leg swelling.  Gastrointestinal: Negative for abdominal pain, blood in stool, diarrhea, melena, nausea and vomiting.  Genitourinary: Negative for dysuria, flank pain and hematuria.  Musculoskeletal: Negative for back pain and joint pain.  Skin: Negative for rash.  Neurological: Negative for dizziness, sensory change, focal weakness, seizures, loss of consciousness, weakness and headaches.  Endo/Heme/Allergies: Negative for polydipsia.  Psychiatric/Behavioral: Negative for depression. The patient is not nervous/anxious.     DRUG ALLERGIES:   Allergies  Allergen Reactions  . Other     Lima beans  Per testing.  . Shellfish Allergy      Cannot recall reaction,Per your allergy testing showed that a allergy to shellfish.    VITALS:  Blood pressure 137/72, pulse 77, temperature 99.2 F (37.3 C), temperature source Oral, resp. rate 20, height 5\' 9"  (1.753 m), weight 100.7 kg, SpO2 99 %. PHYSICAL EXAMINATION:  Physical Exam  Constitutional: He is oriented to person, place, and time.  HENT:  Head: Normocephalic.  Mouth/Throat: Oropharynx is clear and moist.  Eyes: Pupils are equal, round, and reactive to light. Conjunctivae and EOM are normal. No scleral icterus.  Neck: Normal range of motion. Neck supple. No JVD present. No tracheal deviation present.  Cardiovascular: Normal rate, regular rhythm and normal heart  sounds. Exam reveals no gallop.  No murmur heard. Pulmonary/Chest: Effort normal and breath sounds normal. No respiratory distress. He has no wheezes. He has no rales.  Abdominal: Soft. Bowel sounds are normal. He exhibits no distension. There is no tenderness. There is no rebound.  Musculoskeletal: Normal range of motion. He exhibits edema. He exhibits no tenderness.  Trace bilateral leg edema.  Neurological: He is alert and oriented to person, place, and time. No cranial nerve deficit.  Skin: No rash noted. No erythema.   LABORATORY PANEL:  Male CBC Recent Labs  Lab 11/23/17 1612  WBC 6.1  HGB 14.2  HCT 40.5  PLT 155   ------------------------------------------------------------------------------------------------------------------ Chemistries  Recent Labs  Lab 11/20/17 0909  11/24/17 0434  NA 136   < > 136  K 4.3   < > 4.1  CL 93*   < > 106  CO2 16*   < > 22  GLUCOSE 371*   < > 167*  BUN 9   < > 7  CREATININE 1.03   < > 0.60*  CALCIUM 10.1   < > 8.6*  MG  --   --  1.7  AST 119*  --   --   ALT 119*  --   --   ALKPHOS 109  --   --   BILITOT 0.5  --   --    < > = values in this interval not displayed.   RADIOLOGY:  No results found. ASSESSMENT AND PLAN:    Randy Deleon  is a 52 y.o. male with a known history of type II diabetes not on insulin, hyperlipidemia/hypertriglyceridemia, hypertension comes to the emergency room with  not feeling well along with weakness malaise and fatigue ability. Patient has not been taking his invokana and glipizide for last two months. States he is been taking his statins and hypertensive meds. He is not been checking his sugars regularly. He reports being under a lot of stress at work and with daughter at home.  1. DKA and type II diabetes -noncompliant to meds for last two months DKA improved with insulin drip and IV fluid support. But hyperglycemia this morning. Change to Lantus 15 units daily, NovoLog 3 units before meals and  moderate sliding scale. -A1c 12.3  2. hypertension continue home hypertension medication.  3. Hyponatremia and acute renal failure due to diabetes ketoacidosis Improved.  4. Hypertriglyceridemia/hyperlipidemia -continue statins and fenofibrate's  Tobacco abuse.  Smoking cessation was counseled for 3 to 4 minutes. All the records are reviewed and case discussed with Care Management/Social Worker. Management plans discussed with the patient, family and they are in agreement.  CODE STATUS: Full Code  TOTAL TIME TAKING CARE OF THIS PATIENT: 35 minutes.   More than 50% of the time was spent in counseling/coordination of care: YES  POSSIBLE D/C IN 2 DAYS, DEPENDING ON CLINICAL CONDITION.   Shaune Pollack M.D on 11/24/2017 at 1:20 PM  Between 7am to 6pm - Pager - [phone]  After 6pm go to [email or web address] - Therapist, [facility] Hospitalists

## 2017-11-25 LAB — BASIC METABOLIC PANEL
ANION GAP: 14 (ref 5–15)
BUN: 8 mg/dL (ref 6–20)
CO2: 20 mmol/L — AB (ref 22–32)
Calcium: 8.4 mg/dL — ABNORMAL LOW (ref 8.9–10.3)
Chloride: 102 mmol/L (ref 98–111)
Creatinine, Ser: 0.62 mg/dL (ref 0.61–1.24)
GFR calc non Af Amer: >60 mL/min (ref >60)
Glucose, Bld: 281 mg/dL — ABNORMAL HIGH (ref 70–99)
Potassium: 4 mmol/L (ref 3.5–5.1)
SODIUM: 136 mmol/L (ref 135–145)

## 2017-11-25 LAB — GLUCOSE, CAPILLARY: GLUCOSE-CAPILLARY: 373 mg/dL — AB (ref 70–99)

## 2017-11-25 MED ORDER — INVOKANA 300 MG PO TABS: 300.0000 mg | ORAL_TABLET | Freq: Every day | ORAL | 2 refills | Status: DC

## 2017-11-25 MED ORDER — INSULIN ASPART 100 UNIT/ML ~~LOC~~ SOLN
5.0000 [IU] | Freq: Three times a day (TID) | SUBCUTANEOUS | Status: DC
  Administered 2017-11-25: 08:00:00 5 [IU] via SUBCUTANEOUS
  Filled 2017-11-25: qty 1

## 2017-11-25 MED ORDER — INSULIN GLARGINE 100 UNIT/ML ~~LOC~~ SOLN
20.0000 [IU] | Freq: Every day | SUBCUTANEOUS | Status: DC
  Filled 2017-11-25: qty 0.2

## 2017-11-25 MED ORDER — KOMBIGLYZE XR 5-1000 MG PO TB24: 1.0000 | ORAL_TABLET | Freq: Two times a day (BID) | ORAL | 2 refills | Status: DC

## 2017-11-25 MED ORDER — INSULIN GLARGINE 100 UNIT/ML ~~LOC~~ SOLN: 20.0000 [IU] | Freq: Every day | SUBCUTANEOUS | 1 refills | Status: DC

## 2017-11-25 NOTE — Discharge Instructions (Signed)
Outpatient DM education at the lifestyle center as well for more focused education and follow up.

## 2017-11-25 NOTE — Discharge Summary (Signed)
[facility] [place] at [facility]   PATIENT NAME: Randy Deleon    MR#:  [id number]  DATE OF BIRTH:  1966-01-31  DATE OF ADMISSION:  11/23/2017   ADMITTING PHYSICIAN: Randy Finner, MD  DATE OF DISCHARGE: 11/25/2017 PRIMARY CARE PHYSICIAN: Randy Monday, MD   ADMISSION DIAGNOSIS:  Generalized weakness [R53.1] Diabetic ketoacidosis without coma associated with type 2 diabetes mellitus (HCC) [E11.10] DISCHARGE DIAGNOSIS:  Active Problems:   DKA, type 1 (HCC)  SECONDARY DIAGNOSIS:   Past Medical History:  Diagnosis Date  . Diabetes mellitus without complication [facility])    HOSPITAL COURSE:  KevinBigelowis a52 y.o.malewith a known history of type II diabetes not on insulin, hyperlipidemia/hypertriglyceridemia, hypertension comes to the emergency room with not feeling well along with weakness malaise and fatigue ability. Patient has not been taking his invokanaand glipizide for last two months.States he is been taking his statins and hypertensive meds. He is not been checking his sugars regularly. He reports being under a lot of stress at work and with daughter at home.  1.DKA and type II diabetes -noncompliant to meds for last two months DKA improved with insulin drip and IV fluid support. But hyperglycemia this morning. Increased Lantus to 20 units daily, NovoLog 5 units before meals and moderate sliding scale. -A1c 12.3 The patient refused to take Lantus at home.  He wants to continue his home diabetes medications.  He promised compliance to his home medications.  He said his blood sugar was about 120s at home with home medication.  But he did not take for the past 2 months.  2.hypertension continue home hypertension medication.  3.Hyponatremia and acute renal failure due to diabetes ketoacidosis Improved.  4.Hypertriglyceridemia/hyperlipidemia -continue statins and fenofibrate's  DISCHARGE CONDITIONS:  Stable, discharge to home  today. CONSULTS OBTAINED:   DRUG ALLERGIES:   Allergies  Allergen Reactions  . Other     Lima beans  Per testing.  . Shellfish Allergy      Cannot recall reaction,Per your allergy testing showed that a allergy to shellfish.    DISCHARGE MEDICATIONS:   Allergies as of 11/25/2017      Reactions   Other    Lima beans  Per testing.   Shellfish Allergy     Cannot recall reaction,Per your allergy testing showed that a allergy to shellfish.       Medication List    TAKE these medications   carvedilol 25 MG tablet Commonly known as:  COREG Take 25 mg by mouth 2 (two) times daily.   CVS D3 5000 units capsule Generic drug:  Cholecalciferol Take 5,000 Units by mouth daily.   fenofibrate 160 MG tablet Take 160 mg daily by mouth.   INVOKANA 300 MG Tabs tablet Generic drug:  canagliflozin Take 1 tablet (300 mg total) by mouth daily before breakfast.   KOMBIGLYZE XR 07-998 MG Tb24 Generic drug:  Saxagliptin-Metformin Take 1 tablet by mouth 2 (two) times daily.   nystatin 100000 UNIT/ML suspension Commonly known as:  MYCOSTATIN Take 5 mLs (500,000 Units total) by mouth 4 (four) times daily.   rosuvastatin 10 MG tablet Commonly known as:  CRESTOR Take 10 mg daily by mouth.   sildenafil 100 MG tablet Commonly known as:  VIAGRA Take 100 mg by mouth as needed for erectile dysfunction.        DISCHARGE INSTRUCTIONS:  See AVS.  If you experience worsening of your admission symptoms, develop shortness of breath, life threatening emergency, suicidal or homicidal thoughts you  must seek medical attention immediately by calling 911 or calling your MD immediately  if symptoms less severe.  You Must read complete instructions/literature along with all the possible adverse reactions/side effects for all the Medicines you take and that have been prescribed to you. Take any new Medicines after you have completely understood and accpet all the possible adverse reactions/side effects.    Please note  You were cared for by a hospitalist during your hospital stay. If you have any questions about your discharge medications or the care you received while you were in the hospital after you are discharged, you can call the unit and asked to speak with the hospitalist on call if the hospitalist that took care of you is not available. Once you are discharged, your primary care physician will handle any further medical issues. Please note that NO REFILLS for any discharge medications will be authorized once you are discharged, as it is imperative that you return to your primary care physician (or establish a relationship with a primary care physician if you do not have one) for your aftercare needs so that they can reassess your need for medications and monitor your lab values.    On the day of Discharge:  VITAL SIGNS:  Blood pressure 126/67, pulse 71, temperature 98.3 F (36.8 C), resp. rate 18, height 5\' 9"  (1.753 m), weight 100.7 kg, SpO2 99 %. PHYSICAL EXAMINATION:  GENERAL:  52 y.o.-year-old patient lying in the bed with no acute distress.  EYES: Pupils equal, round, reactive to light and accommodation. No scleral icterus. Extraocular muscles intact.  HEENT: Head atraumatic, normocephalic. Oropharynx and nasopharynx clear.  NECK:  Supple, no jugular venous distention. No thyroid enlargement, no tenderness.  LUNGS: Normal breath sounds bilaterally, no wheezing, rales,rhonchi or crepitation. No use of accessory muscles of respiration.  CARDIOVASCULAR: S1, S2 normal. No murmurs, rubs, or gallops.  ABDOMEN: Soft, non-tender, non-distended. Bowel sounds present. No organomegaly or mass.  EXTREMITIES: No pedal edema, cyanosis, or clubbing.  NEUROLOGIC: Cranial nerves II through XII are intact. Muscle strength 5/5 in all extremities. Sensation intact. Gait not checked.  PSYCHIATRIC: The patient is alert and oriented x 3.  SKIN: No obvious rash, lesion, or ulcer.  DATA REVIEW:    CBC Recent Labs  Lab 11/23/17 1612  WBC 6.1  HGB 14.2  HCT 40.5  PLT 155    Chemistries  Recent Labs  Lab 11/20/17 0909  11/24/17 0434  11/25/17 0438  NA 136   < > 136   < > 136  K 4.3   < > 4.1   < > 4.0  CL 93*   < > 106   < > 102  CO2 16*   < > 22   < > 20*  GLUCOSE 371*   < > 167*   < > 281*  BUN 9   < > 7   < > 8  CREATININE 1.03   < > 0.60*   < > 0.62  CALCIUM 10.1   < > 8.6*   < > 8.4*  MG  --   --  1.7  --   --   AST 119*  --   --   --   --   ALT 119*  --   --   --   --   ALKPHOS 109  --   --   --   --   BILITOT 0.5  --   --   --   --    < > =  values in this interval not displayed.     Microbiology Results  Results for orders placed or performed during the hospital encounter of 11/23/17  MRSA PCR Screening     Status: None   Collection Time: 11/23/17  8:36 PM  Result Value Ref Range Status   MRSA by PCR NEGATIVE NEGATIVE Final    Comment:        The GeneXpert MRSA Assay (FDA approved for NASAL specimens only), is one component of a comprehensive MRSA colonization surveillance program. It is not intended to diagnose MRSA infection nor to guide or monitor treatment for MRSA infections. Performed at [facility], [address] [place], [place] [address]     RADIOLOGY:  No results found.   Management plans discussed with the patient, family and they are in agreement.  CODE STATUS: Full Code   TOTAL TIME TAKING CARE OF THIS PATIENT: 35 minutes.    Shaune Pollack M.D on 11/25/2017 at 12:44 PM  Between 7am to 6pm - Pager - [phone]  After 6pm go to [email or web address] - Social research officer, government  [facility] [place] [facility]  Office  [phone]  CC: Primary care physician; Randy Monday, MD   Note: This dictation was prepared with Dragon dictation along with smaller phrase technology. Any transcriptional errors that result from this process are unintentional.

## 2017-11-25 NOTE — Progress Notes (Signed)
Inpatient Diabetes Program Recommendations  AACE/ADA: New Consensus Statement on Inpatient Glycemic Control (2015)  Target Ranges:  Prepandial:   less than 140 mg/dL      Peak postprandial:   less than 180 mg/dL (1-2 hours)      Critically ill patients:  140 - 180 mg/dL   Results for Randy Deleon, Randy Deleon (MRN [id number]) as of 11/25/2017 08:09  Ref. Range 11/24/2017 02:51 11/24/2017 03:49 11/24/2017 08:00 11/24/2017 11:20 11/24/2017 16:44 11/24/2017 21:02 11/25/2017 07:29  Glucose-Capillary Latest Ref Range: 70 - 99 mg/dL 569 (H) 794 (H) 801 (H) 332 (H) 294 (H) 270 (H) 373 (H)   Review of Glycemic Control  Diabetes history: DM 2 Outpatient Diabetes medications: Invokana 300 mg Daily, Kombiglyze 07-998 mg BID Current orders for Inpatient glycemic control: Lantus 15 units, Novolog 0-15 units tid, Novolog 0-5 units qhs, Novolog 3 units tid meal coverage  Inpatient Diabetes Program Recommendations:    Fasting glucose 373 this am. Patient received 2 doses of Lantus yesterday after mid night (10 units and 15 units). Consider Lantus 30 units today.  Thanks,  Christena Deem RN, MSN, BC-ADM Inpatient Diabetes Coordinator Team Pager [phone] (8a-5p)

## 2017-11-25 NOTE — Progress Notes (Signed)
Pt D/C to home by self. IV's removed intact. Education completed. All paperwork given to pt. Prescriptions given to pt. VSS. Wheeled to car by NT.

## 2017-11-25 NOTE — Progress Notes (Signed)
Nutrition Brief Note  Patient identified on the Malnutrition Screening Tool (MST) Report  Wt Readings from Last 15 Encounters:  11/23/17 100.7 kg  11/19/17 100.2 kg  04/11/17 117.1 kg  01/29/17 117.9 kg   Met with patient at bedside. He reports he had a decreased appetite and intake for a few days PTA related to not feeling well and sores in his mouth. He typically has a very good appetite and intake. Appetite has returned to normal now and he is eating 100% of meals. UBW was 250 lbs (113.6 kg) and he has lost weight related to not taking DM medications. Patient verbalizes understanding of diet for diabetes and reports he does not have any nutrition questions or concerns. Per chart he was 117.1 kg on 04/11/2017. Unsure if current weight is accurate as it appears stated. No muscle or fat depletion present on Nutrition-Focused Physical Exam. Patient does not meet criteria for malnutrition.  Body mass index is 32.78 kg/m. Patient meets criteria for Obesity Class I based on current BMI.   Current diet order is Carbohydrate Modified, patient is consuming approximately 100% of meals at this time. Labs and medications reviewed.   No nutrition interventions warranted at this time. If nutrition issues arise, please consult RD.   Willey Blade, MS, [place], LDN Office: [phone] Pager: [phone] After Hours/Weekend Pager: [phone]

## 2018-03-20 ENCOUNTER — Inpatient Hospital Stay
Admission: EM | Admit: 2018-03-20 | Discharge: 2018-03-23 | DRG: 438 | Disposition: A | Discharge status: Other Acute Inpatient Hospital | Payer: BLUE CROSS/BLUE SHIELD | Attending: Internal Medicine | Admitting: Internal Medicine

## 2018-03-20 ENCOUNTER — Emergency Department: Payer: BLUE CROSS/BLUE SHIELD

## 2018-03-20 ENCOUNTER — Encounter: Payer: Self-pay | Admitting: Emergency Medicine

## 2018-03-20 ENCOUNTER — Ambulatory Visit
Admission: EM | Admit: 2018-03-20 | Discharge: 2018-03-20 | Disposition: A | Discharge status: Other Acute Inpatient Hospital | Payer: BLUE CROSS/BLUE SHIELD | Source: Home / Self Care | Attending: Family Medicine | Admitting: Family Medicine

## 2018-03-20 ENCOUNTER — Other Ambulatory Visit: Payer: Self-pay

## 2018-03-20 DIAGNOSIS — R112 Nausea with vomiting, unspecified: Secondary | ICD-10-CM

## 2018-03-20 DIAGNOSIS — Z4659 Encounter for fitting and adjustment of other gastrointestinal appliance and device: Secondary | ICD-10-CM

## 2018-03-20 DIAGNOSIS — E785 Hyperlipidemia, unspecified: Secondary | ICD-10-CM | POA: Diagnosis present

## 2018-03-20 DIAGNOSIS — G9349 Other encephalopathy: Secondary | ICD-10-CM | POA: Diagnosis present

## 2018-03-20 DIAGNOSIS — N179 Acute kidney failure, unspecified: Secondary | ICD-10-CM | POA: Diagnosis present

## 2018-03-20 DIAGNOSIS — K852 Alcohol induced acute pancreatitis without necrosis or infection: Secondary | ICD-10-CM | POA: Diagnosis present

## 2018-03-20 DIAGNOSIS — Z7901 Long term (current) use of anticoagulants: Secondary | ICD-10-CM | POA: Insufficient documentation

## 2018-03-20 DIAGNOSIS — E860 Dehydration: Secondary | ICD-10-CM

## 2018-03-20 DIAGNOSIS — E1010 Type 1 diabetes mellitus with ketoacidosis without coma: Secondary | ICD-10-CM | POA: Diagnosis present

## 2018-03-20 DIAGNOSIS — Z7984 Long term (current) use of oral hypoglycemic drugs: Secondary | ICD-10-CM

## 2018-03-20 DIAGNOSIS — Z8249 Family history of ischemic heart disease and other diseases of the circulatory system: Secondary | ICD-10-CM | POA: Insufficient documentation

## 2018-03-20 DIAGNOSIS — Z452 Encounter for adjustment and management of vascular access device: Secondary | ICD-10-CM

## 2018-03-20 DIAGNOSIS — F17210 Nicotine dependence, cigarettes, uncomplicated: Secondary | ICD-10-CM | POA: Insufficient documentation

## 2018-03-20 DIAGNOSIS — I10 Essential (primary) hypertension: Secondary | ICD-10-CM | POA: Insufficient documentation

## 2018-03-20 DIAGNOSIS — R6521 Severe sepsis with septic shock: Secondary | ICD-10-CM | POA: Diagnosis not present

## 2018-03-20 DIAGNOSIS — Z01818 Encounter for other preprocedural examination: Secondary | ICD-10-CM

## 2018-03-20 DIAGNOSIS — F10239 Alcohol dependence with withdrawal, unspecified: Secondary | ICD-10-CM | POA: Diagnosis not present

## 2018-03-20 DIAGNOSIS — E875 Hyperkalemia: Secondary | ICD-10-CM | POA: Diagnosis present

## 2018-03-20 DIAGNOSIS — Z9114 Patient's other noncompliance with medication regimen: Secondary | ICD-10-CM

## 2018-03-20 DIAGNOSIS — R197 Diarrhea, unspecified: Secondary | ICD-10-CM

## 2018-03-20 DIAGNOSIS — E1110 Type 2 diabetes mellitus with ketoacidosis without coma: Secondary | ICD-10-CM | POA: Diagnosis present

## 2018-03-20 DIAGNOSIS — Z9119 Patient's noncompliance with other medical treatment and regimen: Secondary | ICD-10-CM

## 2018-03-20 DIAGNOSIS — J9600 Acute respiratory failure, unspecified whether with hypoxia or hypercapnia: Secondary | ICD-10-CM

## 2018-03-20 DIAGNOSIS — Z79899 Other long term (current) drug therapy: Secondary | ICD-10-CM

## 2018-03-20 DIAGNOSIS — E872 Acidosis: Secondary | ICD-10-CM | POA: Insufficient documentation

## 2018-03-20 DIAGNOSIS — A419 Sepsis, unspecified organism: Secondary | ICD-10-CM | POA: Diagnosis not present

## 2018-03-20 DIAGNOSIS — E781 Pure hyperglyceridemia: Secondary | ICD-10-CM | POA: Diagnosis present

## 2018-03-20 DIAGNOSIS — E1165 Type 2 diabetes mellitus with hyperglycemia: Secondary | ICD-10-CM

## 2018-03-20 DIAGNOSIS — E119 Type 2 diabetes mellitus without complications: Secondary | ICD-10-CM

## 2018-03-20 DIAGNOSIS — K859 Acute pancreatitis without necrosis or infection, unspecified: Secondary | ICD-10-CM | POA: Diagnosis not present

## 2018-03-20 DIAGNOSIS — E8729 Other acidosis: Secondary | ICD-10-CM

## 2018-03-20 DIAGNOSIS — J9601 Acute respiratory failure with hypoxia: Secondary | ICD-10-CM | POA: Diagnosis not present

## 2018-03-20 DIAGNOSIS — Z91013 Allergy to seafood: Secondary | ICD-10-CM

## 2018-03-20 DIAGNOSIS — R61 Generalized hyperhidrosis: Secondary | ICD-10-CM

## 2018-03-20 DIAGNOSIS — R109 Unspecified abdominal pain: Secondary | ICD-10-CM | POA: Insufficient documentation

## 2018-03-20 HISTORY — DX: Essential (primary) hypertension: I10

## 2018-03-20 LAB — CBC WITH DIFFERENTIAL/PLATELET
Abs Immature Granulocytes: 0.06 10*3/uL (ref 0.00–0.07)
BASOS PCT: 0 %
Basophils Absolute: 0 10*3/uL (ref 0.0–0.1)
EOS ABS: 0 10*3/uL (ref 0.0–0.5)
Eosinophils Relative: 0 %
HCT: 56.6 % — ABNORMAL HIGH (ref 39.0–52.0)
Hemoglobin: 19.4 g/dL — ABNORMAL HIGH (ref 13.0–17.0)
Immature Granulocytes: 0 %
Lymphocytes Relative: 6 %
Lymphs Abs: 0.9 10*3/uL (ref 0.7–4.0)
MCH: 33.8 pg (ref 26.0–34.0)
MCHC: 34.3 g/dL (ref 30.0–36.0)
MCV: 98.6 fL (ref 80.0–100.0)
MONO ABS: 0.9 10*3/uL (ref 0.1–1.0)
Monocytes Relative: 6 %
Neutro Abs: 13.3 10*3/uL — ABNORMAL HIGH (ref 1.7–7.7)
Neutrophils Relative %: 88 %
PLATELETS: 112 10*3/uL — AB (ref 150–400)
RBC: 5.74 MIL/uL (ref 4.22–5.81)
RDW: 12.1 % (ref 11.5–15.5)
WBC: 15.2 10*3/uL — ABNORMAL HIGH (ref 4.0–10.5)
nRBC: 0 % (ref 0.0–0.2)

## 2018-03-20 LAB — COMPREHENSIVE METABOLIC PANEL
ALT: 45 U/L — AB (ref 0–44)
ALT: 44 U/L (ref 0–44)
AST: 53 U/L — ABNORMAL HIGH (ref 15–41)
AST: 48 U/L — AB (ref 15–41)
Albumin: 4.9 g/dL (ref 3.5–5.0)
Albumin: 4.6 g/dL (ref 3.5–5.0)
Alkaline Phosphatase: 75 U/L (ref 38–126)
Alkaline Phosphatase: 69 U/L (ref 38–126)
Anion gap: 20 — ABNORMAL HIGH (ref 5–15)
Anion gap: 21 — ABNORMAL HIGH (ref 5–15)
BILIRUBIN TOTAL: 0.7 mg/dL (ref 0.3–1.2)
BUN: 11 mg/dL (ref 6–20)
BUN: 12 mg/dL (ref 6–20)
CHLORIDE: 99 mmol/L (ref 98–111)
CO2: 22 mmol/L (ref 22–32)
CO2: 18 mmol/L — ABNORMAL LOW (ref 22–32)
CREATININE: 0.95 mg/dL (ref 0.61–1.24)
Calcium: 9.4 mg/dL (ref 8.9–10.3)
Calcium: 9.3 mg/dL (ref 8.9–10.3)
Chloride: 101 mmol/L (ref 98–111)
Creatinine, Ser: 0.89 mg/dL (ref 0.61–1.24)
GFR calc Af Amer: >60 mL/min (ref >60)
GFR calc non Af Amer: >60 mL/min (ref >60)
Glucose, Bld: 235 mg/dL — ABNORMAL HIGH (ref 70–99)
Glucose, Bld: 201 mg/dL — ABNORMAL HIGH (ref 70–99)
POTASSIUM: 3.5 mmol/L (ref 3.5–5.1)
Potassium: 3.7 mmol/L (ref 3.5–5.1)
Sodium: 141 mmol/L (ref 135–145)
Sodium: 140 mmol/L (ref 135–145)
TOTAL PROTEIN: 7.9 g/dL (ref 6.5–8.1)
Total Bilirubin: 1.2 mg/dL (ref 0.3–1.2)
Total Protein: 8.1 g/dL (ref 6.5–8.1)

## 2018-03-20 LAB — BLOOD GAS, VENOUS
Acid-base deficit: 7.8 mmol/L — ABNORMAL HIGH (ref 0.0–2.0)
Bicarbonate: 21.7 mmol/L (ref 20.0–28.0)
O2 Saturation: 46.9 %
Patient temperature: 37
pCO2, Ven: 61 mmHg — ABNORMAL HIGH (ref 44.0–60.0)
pH, Ven: 7.16 — CL (ref 7.250–7.430)
pO2, Ven: 34 mmHg (ref 32.0–45.0)

## 2018-03-20 LAB — GLUCOSE, CAPILLARY
GLUCOSE-CAPILLARY: 256 mg/dL — AB (ref 70–99)
GLUCOSE-CAPILLARY: 208 mg/dL — AB (ref 70–99)
GLUCOSE-CAPILLARY: 280 mg/dL — AB (ref 70–99)
Glucose-Capillary: 280 mg/dL — ABNORMAL HIGH (ref 70–99)

## 2018-03-20 LAB — URINALYSIS, COMPLETE (UACMP) WITH MICROSCOPIC
Bacteria, UA: NONE SEEN
Bilirubin Urine: NEGATIVE
Glucose, UA: >=500 mg/dL — AB
Ketones, ur: 20 mg/dL — AB
Leukocytes, UA: NEGATIVE
Nitrite: NEGATIVE
Protein, ur: 100 mg/dL — AB
Specific Gravity, Urine: >1.046 — ABNORMAL HIGH (ref 1.005–1.030)
Squamous Epithelial / HPF: NONE SEEN (ref 0–5)
pH: 5 (ref 5.0–8.0)

## 2018-03-20 LAB — BASIC METABOLIC PANEL
Anion gap: 15 (ref 5–15)
BUN: 11 mg/dL (ref 6–20)
CHLORIDE: 103 mmol/L (ref 98–111)
CO2: 16 mmol/L — ABNORMAL LOW (ref 22–32)
Calcium: 7.9 mg/dL — ABNORMAL LOW (ref 8.9–10.3)
Creatinine, Ser: 0.81 mg/dL (ref 0.61–1.24)
GFR calc Af Amer: >60 mL/min (ref >60)
GFR calc non Af Amer: >60 mL/min (ref >60)
Glucose, Bld: 297 mg/dL — ABNORMAL HIGH (ref 70–99)
Potassium: 4.5 mmol/L (ref 3.5–5.1)
Sodium: 134 mmol/L — ABNORMAL LOW (ref 135–145)

## 2018-03-20 LAB — CBC
HCT: 58.9 % — ABNORMAL HIGH (ref 39.0–52.0)
HEMOGLOBIN: 19.7 g/dL — AB (ref 13.0–17.0)
MCH: 33.5 pg (ref 26.0–34.0)
MCHC: 33.4 g/dL (ref 30.0–36.0)
MCV: 100.2 fL — ABNORMAL HIGH (ref 80.0–100.0)
Platelets: 124 10*3/uL — ABNORMAL LOW (ref 150–400)
RBC: 5.88 MIL/uL — ABNORMAL HIGH (ref 4.22–5.81)
RDW: 12.1 % (ref 11.5–15.5)
WBC: 17.4 10*3/uL — ABNORMAL HIGH (ref 4.0–10.5)
nRBC: 0 % (ref 0.0–0.2)

## 2018-03-20 LAB — TRIGLYCERIDES: TRIGLYCERIDES: 402 mg/dL — AB (ref <150)

## 2018-03-20 LAB — LIPASE, BLOOD: Lipase: 763 U/L — ABNORMAL HIGH (ref 11–51)

## 2018-03-20 LAB — TROPONIN I

## 2018-03-20 MED ORDER — HYDROMORPHONE HCL 1 MG/ML IJ SOLN
1.0000 mg | Freq: Once | INTRAMUSCULAR | Status: AC
  Administered 2018-03-20: 1 mg via INTRAVENOUS
  Filled 2018-03-20: qty 1

## 2018-03-20 MED ORDER — ONDANSETRON HCL 4 MG PO TABS: 4.0000 mg | ORAL_TABLET | Freq: Four times a day (QID) | ORAL | Status: DC | PRN

## 2018-03-20 MED ORDER — LORAZEPAM 1 MG PO TABS: 1.0000 mg | ORAL_TABLET | Freq: Four times a day (QID) | ORAL | Status: DC | PRN

## 2018-03-20 MED ORDER — ONDANSETRON 8 MG PO TBDP
8.0000 mg | ORAL_TABLET | Freq: Once | ORAL | Status: AC
  Administered 2018-03-20: 8 mg via ORAL

## 2018-03-20 MED ORDER — NICOTINE 21 MG/24HR TD PT24
21.0000 mg | MEDICATED_PATCH | Freq: Every day | TRANSDERMAL | Status: DC
  Administered 2018-03-20 – 2018-03-22 (×3): 21 mg via TRANSDERMAL
  Filled 2018-03-20 (×3): qty 1

## 2018-03-20 MED ORDER — HYDROMORPHONE HCL 1 MG/ML IJ SOLN
INTRAMUSCULAR | Status: AC
  Administered 2018-03-20: 1 mg via INTRAVENOUS
  Filled 2018-03-20: qty 1

## 2018-03-20 MED ORDER — SODIUM CHLORIDE 0.9 % IV BOLUS
1000.0000 mL | Freq: Once | INTRAVENOUS | Status: AC
  Administered 2018-03-20: 1000 mL via INTRAVENOUS

## 2018-03-20 MED ORDER — FOLIC ACID 1 MG PO TABS
1.0000 mg | ORAL_TABLET | Freq: Every day | ORAL | Status: DC
  Administered 2018-03-20 – 2018-03-21 (×2): 1 mg via ORAL
  Filled 2018-03-20 (×2): qty 1

## 2018-03-20 MED ORDER — THIAMINE HCL 100 MG/ML IJ SOLN: 100.0000 mg | Freq: Every day | INTRAMUSCULAR | Status: DC

## 2018-03-20 MED ORDER — HYDROMORPHONE HCL 1 MG/ML IJ SOLN
1.0000 mg | INTRAMUSCULAR | Status: DC | PRN
  Administered 2018-03-20: 1 mg via INTRAVENOUS

## 2018-03-20 MED ORDER — LORAZEPAM 2 MG PO TABS
0.0000 mg | ORAL_TABLET | Freq: Four times a day (QID) | ORAL | Status: DC
  Filled 2018-03-20: qty 2

## 2018-03-20 MED ORDER — LORAZEPAM 2 MG/ML IJ SOLN: 0.0000 mg | Freq: Two times a day (BID) | INTRAMUSCULAR | Status: DC

## 2018-03-20 MED ORDER — VITAMIN B-1 100 MG PO TABS
100.0000 mg | ORAL_TABLET | Freq: Every day | ORAL | Status: DC
  Administered 2018-03-21: 100 mg via ORAL
  Filled 2018-03-20: qty 1

## 2018-03-20 MED ORDER — MORPHINE SULFATE (PF) 2 MG/ML IV SOLN
2.0000 mg | INTRAVENOUS | Status: DC | PRN
  Administered 2018-03-20: 2 mg via INTRAVENOUS
  Filled 2018-03-20: qty 1

## 2018-03-20 MED ORDER — DEXTROSE-NACL 5-0.45 % IV SOLN
INTRAVENOUS | Status: DC
  Administered 2018-03-20 – 2018-03-21 (×4): via INTRAVENOUS

## 2018-03-20 MED ORDER — POTASSIUM CHLORIDE 10 MEQ/100ML IV SOLN
10.0000 meq | INTRAVENOUS | Status: AC
  Administered 2018-03-20 (×2): 10 meq via INTRAVENOUS
  Filled 2018-03-20 (×2): qty 100

## 2018-03-20 MED ORDER — IOPAMIDOL (ISOVUE-300) INJECTION 61%
100.0000 mL | Freq: Once | INTRAVENOUS | Status: AC | PRN
  Administered 2018-03-20: 100 mL via INTRAVENOUS

## 2018-03-20 MED ORDER — METOPROLOL TARTRATE 5 MG/5ML IV SOLN
5.0000 mg | Freq: Three times a day (TID) | INTRAVENOUS | Status: DC
  Administered 2018-03-20 – 2018-03-21 (×3): 5 mg via INTRAVENOUS
  Filled 2018-03-20 (×4): qty 5

## 2018-03-20 MED ORDER — LORAZEPAM 2 MG/ML IJ SOLN
1.0000 mg | Freq: Four times a day (QID) | INTRAMUSCULAR | Status: DC | PRN
  Administered 2018-03-21: 1 mg via INTRAVENOUS
  Filled 2018-03-20: qty 1

## 2018-03-20 MED ORDER — ONDANSETRON HCL 4 MG/2ML IJ SOLN
4.0000 mg | Freq: Once | INTRAMUSCULAR | Status: AC
  Administered 2018-03-20: 4 mg via INTRAVENOUS
  Filled 2018-03-20: qty 2

## 2018-03-20 MED ORDER — INSULIN REGULAR(HUMAN) IN NACL 100-0.9 UT/100ML-% IV SOLN
INTRAVENOUS | Status: DC
  Administered 2018-03-20: 2.2 [IU]/h via INTRAVENOUS
  Filled 2018-03-20 (×2): qty 100

## 2018-03-20 MED ORDER — LORAZEPAM 2 MG/ML IJ SOLN
0.0000 mg | Freq: Four times a day (QID) | INTRAMUSCULAR | Status: DC
  Filled 2018-03-20: qty 1

## 2018-03-20 MED ORDER — ACETAMINOPHEN 650 MG RE SUPP
650.0000 mg | Freq: Four times a day (QID) | RECTAL | Status: DC | PRN
  Administered 2018-03-21: 650 mg via RECTAL
  Filled 2018-03-20: qty 1

## 2018-03-20 MED ORDER — VITAMIN B-1 100 MG PO TABS
100.0000 mg | ORAL_TABLET | Freq: Every day | ORAL | Status: DC
  Administered 2018-03-20: 100 mg via ORAL
  Filled 2018-03-20: qty 1

## 2018-03-20 MED ORDER — ACETAMINOPHEN 325 MG PO TABS: 650.0000 mg | ORAL_TABLET | Freq: Four times a day (QID) | ORAL | Status: DC | PRN

## 2018-03-20 MED ORDER — HYDROMORPHONE HCL 1 MG/ML IJ SOLN
1.0000 mg | INTRAMUSCULAR | Status: AC
  Administered 2018-03-20: 1 mg via INTRAVENOUS

## 2018-03-20 MED ORDER — ENOXAPARIN SODIUM 40 MG/0.4ML ~~LOC~~ SOLN
40.0000 mg | SUBCUTANEOUS | Status: DC
  Administered 2018-03-20 – 2018-03-21 (×2): 40 mg via SUBCUTANEOUS
  Filled 2018-03-20 (×2): qty 0.4

## 2018-03-20 MED ORDER — HYDROMORPHONE HCL 1 MG/ML IJ SOLN
1.0000 mg | INTRAMUSCULAR | Status: DC | PRN
  Administered 2018-03-21 (×5): 1 mg via INTRAVENOUS
  Filled 2018-03-20 (×5): qty 1

## 2018-03-20 MED ORDER — ONDANSETRON HCL 4 MG/2ML IJ SOLN
4.0000 mg | Freq: Four times a day (QID) | INTRAMUSCULAR | Status: DC | PRN
  Administered 2018-03-20: 4 mg via INTRAVENOUS
  Filled 2018-03-20: qty 2

## 2018-03-20 MED ORDER — INSULIN ASPART 100 UNIT/ML ~~LOC~~ SOLN: 0.0000 [IU] | Freq: Three times a day (TID) | SUBCUTANEOUS | Status: DC

## 2018-03-20 MED ORDER — LORAZEPAM 2 MG PO TABS: 0.0000 mg | ORAL_TABLET | Freq: Two times a day (BID) | ORAL | Status: DC

## 2018-03-20 MED ORDER — SODIUM CHLORIDE 0.9 % IV SOLN
INTRAVENOUS | Status: DC
  Administered 2018-03-20: 23:00:00 via INTRAVENOUS

## 2018-03-20 MED ORDER — IOPAMIDOL (ISOVUE-300) INJECTION 61%
30.0000 mL | Freq: Once | INTRAVENOUS | Status: AC
  Administered 2018-03-20: 30 mL via ORAL

## 2018-03-20 MED ORDER — SODIUM CHLORIDE 0.9 % IV BOLUS
1000.0000 mL | Freq: Once | INTRAVENOUS | Status: DC
  Administered 2018-03-20: 1000 mL via INTRAVENOUS

## 2018-03-20 MED ORDER — LORAZEPAM 2 MG/ML IJ SOLN
0.0000 mg | Freq: Four times a day (QID) | INTRAMUSCULAR | Status: DC
  Administered 2018-03-21: 1 mg via INTRAVENOUS

## 2018-03-20 MED ORDER — ADULT MULTIVITAMIN W/MINERALS CH
1.0000 | ORAL_TABLET | Freq: Every day | ORAL | Status: DC
  Administered 2018-03-20 – 2018-03-21 (×2): 1 via ORAL
  Filled 2018-03-20 (×2): qty 1

## 2018-03-20 MED ORDER — THIAMINE HCL 100 MG/ML IJ SOLN
100.0000 mg | Freq: Every day | INTRAMUSCULAR | Status: DC
  Administered 2018-03-22: 100 mg via INTRAVENOUS
  Filled 2018-03-20: qty 2

## 2018-03-20 MED ORDER — INSULIN ASPART 100 UNIT/ML ~~LOC~~ SOLN: 0.0000 [IU] | Freq: Every day | SUBCUTANEOUS | Status: DC

## 2018-03-20 NOTE — H&P (Signed)
[facility] - [place] at [facility]   PATIENT NAME: Randy Deleon    MR#:  [id number]  DATE OF BIRTH:  Dec 16, 1965  DATE OF ADMISSION:  03/20/2018  PRIMARY CARE PHYSICIAN: Randy Monday, MD   REQUESTING/REFERRING PHYSICIAN:   CHIEF COMPLAINT:   Chief Complaint  Patient presents with  . Nausea  . Emesis    HISTORY OF PRESENT ILLNESS: Randy Deleon  is a 53 y.o. male with a known history of tobacco abuse, diabetes mellitus type 2, hypertension presented to the emergency room for abdominal discomfort, nausea and vomiting.  This been going on for 2 days.  The abdominal pain is located in the epigastrium.  It is sharp in nature 7 out of 10 on a scale of 1-10.  Work-up in the emergency room showed elevated lipase level.  His anion gap is also elevated and patient drinks alcohol regularly.  Patient is dehydrated.  CT abdomen pending.  PAST MEDICAL HISTORY:   Past Medical History:  Diagnosis Date  . Diabetes mellitus without complication (HCC)   . Hypertension     PAST SURGICAL HISTORY:  Past Surgical History:  Procedure Laterality Date  . none      SOCIAL HISTORY:  Active smoker Drinks alcohol on a regular basis No history of illicit drug use  FAMILY HISTORY:  Family History  Problem Relation Age of Onset  . Diabetes Mother   . Hypertension Mother   . Congestive Heart Failure Father   . Diabetes Father   . Hypertension Father     DRUG ALLERGIES:  Allergies  Allergen Reactions  . Other     Lima beans  Per testing.  . Shellfish Allergy      Cannot recall reaction,Per your allergy testing showed that a allergy to shellfish.     REVIEW OF SYSTEMS:   CONSTITUTIONAL: No fever, fatigue or weakness.  EYES: No blurred or double vision.  EARS, NOSE, AND THROAT: No tinnitus or ear pain.  RESPIRATORY: No cough, shortness of breath, wheezing or hemoptysis.  CARDIOVASCULAR: No chest pain, orthopnea, edema.  GASTROINTESTINAL: Has nausea,  vomiting, abdominal pain. No diarrhea GENITOURINARY: No dysuria, hematuria.  ENDOCRINE: No polyuria, nocturia,  HEMATOLOGY: No anemia, easy bruising or bleeding SKIN: No rash or lesion. MUSCULOSKELETAL: No joint pain or arthritis.   NEUROLOGIC: No tingling, numbness, weakness.  PSYCHIATRY: No anxiety or depression.   MEDICATIONS AT HOME:  Prior to Admission medications   Medication Sig Start Date End Date Taking? Authorizing Provider  carvedilol (COREG) 25 MG tablet Take 25 mg by mouth 2 (two) times daily. 11/07/17   [provider]  CVS D3 5000 units capsule Take 5,000 Units by mouth daily.  11/28/16   [provider]  fenofibrate 160 MG tablet Take 160 mg daily by mouth. 11/22/16   [provider]  INVOKANA 300 MG TABS tablet Take 1 tablet (300 mg total) by mouth daily before breakfast. 11/25/17   Shaune Pollack, MD  KOMBIGLYZE XR 07-998 MG TB24 Take 1 tablet by mouth 2 (two) times daily. 11/25/17   Shaune Pollack, MD  nystatin (MYCOSTATIN) 100000 UNIT/ML suspension Take 5 mLs (500,000 Units total) by mouth 4 (four) times daily. 11/19/17   Flinchum, Eula Fried, FNP  rosuvastatin (CRESTOR) 10 MG tablet Take 10 mg daily by mouth. 01/10/17   [provider]  sildenafil (VIAGRA) 100 MG tablet Take 100 mg by mouth as needed for erectile dysfunction.  01/10/17   [provider]  PHYSICAL EXAMINATION:   VITAL SIGNS: Blood pressure (!) 156/106, pulse (!) 114, resp. rate 16, SpO2 98 %.  GENERAL:  53 y.o.-year-old patient lying in the bed with no acute distress.  EYES: Pupils equal, round, reactive to light and accommodation. No scleral icterus. Extraocular muscles intact.  HEENT: Head atraumatic, normocephalic. Oropharynx dry and nasopharynx clear.  NECK:  Supple, no jugular venous distention. No thyroid enlargement, no tenderness.  LUNGS: Normal breath sounds bilaterally, no wheezing, rales,rhonchi or crepitation. No use of accessory muscles of respiration.   CARDIOVASCULAR: S1, S2 normal. No murmurs, rubs, or gallops.  ABDOMEN: Soft, tenderness noted in the epigastrium, nondistended. Bowel sounds present. No organomegaly or mass.  EXTREMITIES: No pedal edema, cyanosis, or clubbing.  NEUROLOGIC: Cranial nerves II through XII are intact. Muscle strength 5/5 in all extremities. Sensation intact. Gait not checked.  PSYCHIATRIC: The patient is alert and oriented x 3.  SKIN: No obvious rash, lesion, or ulcer.   LABORATORY PANEL:   CBC Recent Labs  Lab 03/20/18 1350 03/20/18 1620  WBC 15.2* 17.4*  HGB 19.4* 19.7*  HCT 56.6* 58.9*  PLT 112* 124*  MCV 98.6 100.2*  MCH 33.8 33.5  MCHC 34.3 33.4  RDW 12.1 12.1  LYMPHSABS 0.9  --   MONOABS 0.9  --   EOSABS 0.0  --   BASOSABS 0.0  --    ------------------------------------------------------------------------------------------------------------------  Chemistries  Recent Labs  Lab 03/20/18 1350 03/20/18 1620  NA 141 140  K 3.5 3.7  CL 99 101  CO2 22 18*  GLUCOSE 235* 201*  BUN 11 12  CREATININE 0.95 0.89  CALCIUM 9.4 9.3  AST 53* 48*  ALT 45* 44  ALKPHOS 75 69  BILITOT 0.7 1.2   ------------------------------------------------------------------------------------------------------------------ estimated creatinine clearance is 120.8 mL/min (by C-G formula based on SCr of 0.89 mg/dL). ------------------------------------------------------------------------------------------------------------------ No results for input(s): TSH, T4TOTAL, T3FREE, THYROIDAB in the last 72 hours.  Invalid input(s): FREET3   Coagulation profile No results for input(s): INR, PROTIME in the last 168 hours. ------------------------------------------------------------------------------------------------------------------- No results for input(s): DDIMER in the last 72 hours. -------------------------------------------------------------------------------------------------------------------  Cardiac  Enzymes No results for input(s): CKMB, TROPONINI, MYOGLOBIN in the last 168 hours.  Invalid input(s): CK ------------------------------------------------------------------------------------------------------------------ Invalid input(s): POCBNP  ---------------------------------------------------------------------------------------------------------------  Urinalysis    Component Value Date/Time   COLORURINE YELLOW (A) 11/23/2017 1718   APPEARANCEUR CLEAR (A) 11/23/2017 1718   LABSPEC 1.038 (H) 11/23/2017 1718   PHURINE 6.0 11/23/2017 1718   GLUCOSEU >=500 (A) 11/23/2017 1718   HGBUR NEGATIVE 11/23/2017 1718   BILIRUBINUR NEGATIVE 11/23/2017 1718   KETONESUR 80 (A) 11/23/2017 1718   PROTEINUR NEGATIVE 11/23/2017 1718   NITRITE NEGATIVE 11/23/2017 1718   LEUKOCYTESUR NEGATIVE 11/23/2017 1718     RADIOLOGY: No results found.  EKG: Orders placed or performed during the hospital encounter of 03/20/18  . ED EKG  . ED EKG  . EKG 12-Lead  . EKG 12-Lead    IMPRESSION AND PLAN:  53 year old male patient with history of alcohol use, tobacco use, diabetes mellitus type 2, hypertension presented to the emergency room for abdominal pain, nausea and vomiting  -Acute pancreatitis Admit patient to medical floor Keep patient n.p.o. Aggressive IV fluid hydration Monitor lipase level Check CT abdomen to assess for any pancreatic necrosis and pseudocyst  -Alcohol abuse Start patient on CIWA protocol Thiamine and folic acid supplements Alcohol cessation counseled to the patient  -Tobacco abuse Tobacco cessation counseled to the patient for 6 minutes Nicotine patch offered  -Type 2 diabetes  mellitus Currently n.p.o. Monitor blood sugars Sliding scale coverage with insulin  -Abdominal pain PRN IV morphine for control of abdominal pain  -DVT prophylaxis subcu Lovenox daily  All the records are reviewed and case discussed with ED provider. Management plans discussed with  the patient, family and they are in agreement.  CODE STATUS:Full code Code Status History    Date Active Date Inactive Code Status Order ID Comments User Context   11/23/2017 2018 11/25/2017 1551 Full Code [id number]  Enedina FinnerPatel, Sona, MD Inpatient       TOTAL TIME TAKING CARE OF THIS PATIENT: 53 minutes.    Ihor AustinPavan Hughie Melroy M.D on 03/20/2018 at 6:46 PM  Between 7am to 6pm - Pager - [phone]  After 6pm go to [email or web address] - password [email or web address] [facility]  [facility] [place] [facility]  Office  [phone]  CC: Primary care physician; Randy Mondayejan-Sie, S Ahmed, MD

## 2018-03-20 NOTE — Progress Notes (Signed)
Advanced care plan.  Purpose of the Encounter: CODE STATUS  Parties in Attendance: Patient  Patient's Decision Capacity: Good  Subjective/Patient's story: Presented to the emergency room for abdominal pain, nausea and vomiting   Objective/Medical story Patient was evaluated in the emergency room has elevated lipase level Has acute pancreatitis and dehydration and acidosis from alcohol Needs IV fluids and management for pancreatitis   Goals of care determination:  Advance care directives goals of care and treatment plan discussed For now patient wants everything done which includes CPR, intubation ventilator the need arises   CODE STATUS: Full code   Time spent discussing advanced care planning: 16 minutes

## 2018-03-20 NOTE — ED Notes (Signed)
Pt transported to CT at this time.

## 2018-03-20 NOTE — ED Notes (Signed)
Pt nauseous and experiencing emesis at this time. Pt instructed to wait until 1720 to attempt to drink contrast again. This is to allow nausea medication to take effect. Pt also currently diaphoretic with with resp rate around 30, and HR of 115-120.

## 2018-03-20 NOTE — ED Provider Notes (Signed)
[facility]    CSN: [id number] Arrival date & time: 03/20/18  1311  History   Chief Complaint Chief Complaint  Patient presents with  . Emesis  . Diarrhea   HPI  53 year old male presents with abdominal pain, nausea, vomiting, diarrhea.  Patient reports that his symptoms started last night.  He reports severe abdominal pain, nausea, vomiting, and diarrhea.  He has vomited 5-6 times today.  Patient unable to localize his abdominal pain.  Patient states that he is drink some water but nothing else.  He endorses that he has not taken his diabetes medication in at least a week.  Has a history of noncompliance.  Has a history of DKA earlier in 2019.  Patient reports that he called EMS and was not taken to the hospital.  No documented fever.  Patient feels very poorly and appears ill.  Patient currently diaphoretic.  No known exacerbating factors.  No other reported symptoms.  No other complaints  History reviewed as below. Past Medical History:  Diagnosis Date  . Diabetes mellitus without complication (HCC)   . Hypertension   Hyperlipidemia  Patient Active Problem List   Diagnosis Date Noted  . DKA, type 1 (HCC) 11/23/2017  . Fatigue 11/19/2017  . Abnormal weight loss 11/19/2017   Home Medications    Prior to Admission medications   Medication Sig Start Date End Date Taking? Authorizing Provider  carvedilol (COREG) 25 MG tablet Take 25 mg by mouth 2 (two) times daily. 11/07/17  Yes [provider]  [employer] D3 5000 units capsule Take 5,000 Units by mouth daily.  11/28/16  Yes [provider]  fenofibrate 160 MG tablet Take 160 mg daily by mouth. 11/22/16  Yes [provider]  INVOKANA 300 MG TABS tablet Take 1 tablet (300 mg total) by mouth daily before breakfast. 11/25/17  Yes Shaune Pollack, MD  KOMBIGLYZE XR 07-998 MG TB24 Take 1 tablet by mouth 2 (two) times daily. 11/25/17  Yes Shaune Pollack, MD  nystatin (MYCOSTATIN) 100000 UNIT/ML suspension Take 5 mLs  (500,000 Units total) by mouth 4 (four) times daily. 11/19/17  Yes Flinchum, Eula Fried, FNP  rosuvastatin (CRESTOR) 10 MG tablet Take 10 mg daily by mouth. 01/10/17  Yes [provider]  sildenafil (VIAGRA) 100 MG tablet Take 100 mg by mouth as needed for erectile dysfunction.  01/10/17  Yes [provider]    Family History Family History  Problem Relation Age of Onset  . Diabetes Mother   . Hypertension Mother   . Congestive Heart Failure Father   . Diabetes Father   . Hypertension Father     Social History Social History   Tobacco Use  . Smoking status: Current Every Day Smoker    Packs/day: 0.75    Years: 20.00    Pack years: 15.00    Types: Cigarettes    Start date: 01/29/1997  . Smokeless tobacco: Never Used  . Tobacco comment: Refer to [facility]  Substance Use Topics  . Alcohol use: Not Currently  . Drug use: Not Currently     Allergies   Other and Shellfish allergy   Review of Systems Review of Systems  Constitutional: Positive for appetite change.  Gastrointestinal: Positive for abdominal pain, nausea and vomiting.   Physical Exam Triage Vital Signs ED Triage Vitals  Enc Vitals Group     BP 03/20/18 1325 (!) 152/134     Pulse Rate 03/20/18 1325 (!) 108     Resp 03/20/18 1325 18  Temp --      Temp src --      SpO2 03/20/18 1325 96 %     Weight 03/20/18 1322 240 lb (108.9 kg)     Height 03/20/18 1322 5\' 9"  (1.753 m)     Head Circumference --      Peak Flow --      Pain Score 03/20/18 1322 9     Pain Loc --      Pain Edu? --      Excl. in GC? --    Updated Vital Signs BP (!) 152/134 (BP Location: Left Arm)   Pulse (!) 108   Resp 18   Ht 5\' 9"  (1.753 m)   Wt 108.9 kg   SpO2 96%   BMI 35.44 kg/m   Visual Acuity Right Eye Distance:   Left Eye Distance:   Bilateral Distance:    Right Eye Near:   Left Eye Near:    Bilateral Near:     Physical Exam Vitals signs and nursing note reviewed.  Constitutional:       Comments: Alert.  However, he appears ill and diaphoretic.  HENT:     Head: Normocephalic and atraumatic.     Mouth/Throat:     Pharynx: Oropharynx is clear.  Eyes:     General: No scleral icterus.    Conjunctiva/sclera: Conjunctivae normal.  Cardiovascular:     Rate and Rhythm: Regular rhythm. Tachycardia present.  Pulmonary:     Effort: Pulmonary effort is normal.     Breath sounds: Normal breath sounds.  Abdominal:     Palpations: Abdomen is soft.     Comments: Protuberant abdomen.  Tenderness to palpation in the epigastric region.  Psychiatric:        Behavior: Behavior normal.     Comments: Flat affect.    UC Treatments / Results  Labs (all labs ordered are listed, but only abnormal results are displayed) Labs Reviewed  CBC WITH DIFFERENTIAL/PLATELET - Abnormal; Notable for the following components:      Result Value   WBC 15.2 (*)    Hemoglobin 19.4 (*)    HCT 56.6 (*)    Platelets 112 (*)    Neutro Abs 13.3 (*)    All other components within normal limits  COMPREHENSIVE METABOLIC PANEL - Abnormal; Notable for the following components:   Glucose, Bld 235 (*)    AST 53 (*)    ALT 45 (*)    Anion gap 20 (*)    All other components within normal limits  GLUCOSE, CAPILLARY  CBG MONITORING, ED    EKG None  Radiology No results found.  Procedures Procedures (including critical care time)  Medications Ordered in UC Medications  sodium chloride 0.9 % bolus 1,000 mL (has no administration in time range)  ondansetron (ZOFRAN-ODT) disintegrating tablet 8 mg (8 mg Oral Given 03/20/18 1330)    Initial Impression / Assessment and Plan / UC Course  I have reviewed the triage vital signs and the nursing notes.  Pertinent labs & imaging results that were available during my care of the patient were reviewed by me and considered in my medical decision making (see chart for details).    53 year old male with uncontrolled diabetes presents with nausea, vomiting,  diarrhea, abdominal pain.  Patient is ill-appearing.  I recommended that he go to the hospital and patient elected to have me proceed with further work-up and evaluation here before sending to the hospital.  Laboratory studies revealed elevated glucose of  235.  Normal sodium, potassium, chloride.  However, his anion gap is 20.  Mildly elevated liver enzymes.  Elevated white count of 15.2.  Hemoconcentration noted with a hemoglobin of 19.4.  IV placed.  Normal saline being given currently.  Given his presentation, clinical appearance, and laboratory studies I am sending him to the emergency room via EMS.  Final Clinical Impressions(s) / UC Diagnoses   Final diagnoses:  Dehydration  Metabolic acidosis, increased anion gap   Discharge Instructions   None    ED Prescriptions    None     Controlled Substance Prescriptions [place] Controlled Substance Registry consulted? Not Applicable   Tommie Sams, DO 03/20/18 1440

## 2018-03-20 NOTE — ED Notes (Signed)
Pt hooked back up to monitor. Pt ambulatory without difficulty.

## 2018-03-20 NOTE — Progress Notes (Signed)
eLink Physician-Brief Progress Note Patient Name: Randy Deleon DOB: [birth year] MRN: [id number]   Date of Service  03/20/2018  HPI/Events of Note  53 yo male with PMH of DM and HTN. Presents with DKA. Initial pH  = 7.16. Now on insulin IV infusion. PCCM asked to assume care in ICU. VSS.   eICU Interventions  No new orders.      Intervention Category Evaluation Type: New Patient Evaluation  Lenell Antu 03/20/2018, 11:08 PM

## 2018-03-20 NOTE — Progress Notes (Signed)
Patient ID: Randy Deleon, male   DOB: 1965/06/19, 53 y.o.   MRN: [id number]  Nurse called with a low pH of 7.16.  Patient does have a gap of 21 on the chemistry.  The patient does take Kombiglyze at home.  With some of these newer agents can get DKA without having a high sugar.  Spoke with the ER physician and we will place on insulin drip.  Spoke with critical care team Kendal Hymen and inform them of the change in status to stepdown unit.  Advised the patient what we are doing insulin drip and stepdown unit.  I will add on triglycerides to see if this is the cause of the patient's pancreatitis also.  Dr. Alford Highland

## 2018-03-20 NOTE — Consult Note (Signed)
Name: Randy MeckelKevin W Deleon MRN: [id number] DOB: 07/19/1965    ADMISSION DATE:  03/20/2018 CONSULTATION DATE: 03/20/2018  REFERRING MD : Dr. Renae GlossWieting   CHIEF COMPLAINT: Nausea/Vomting   BRIEF PATIENT DESCRIPTION:  53 yo male with hx of ETOH abuse admitted with acute pancreatitis and DKA requiring insulin gtt   SIGNIFICANT EVENTS  01/1-Pt admitted to the stepdown unit on insulin gtt   STUDIES:  CT Abd Pelvis 01/1>>Marked edema and fluid surrounding the pancreas, consistent with acute pancreatitis. No organized fluid collections or evidence for necrosis at this time. Anterior wall thickening of the second and third portion of duodenum, likely reactive/due to inflammatory changes in the pancreas. Hepatic steatosis  HISTORY OF PRESENT ILLNESS:   This is a 53 yo male with a PMH of HTN, DKA, and Type II Diabetes Mellitus. [facility] ER on 01/1 with c/o nausea, vomiting, diarrhea, and abdominal pain onset of symptoms the night of 12/31.  Per ER notes the pt called EMS the afternoon of 01/1, however per pt EMS personal told the pt he likely had the stomach virus pt not transported to the ER.  However, symptoms persisted prompting the pt to proceed to [facility].  Lab results revealed glucose 235, anion gap 20, and liver enzymes mildly elevated therefore recommended pt proceed to the emergency room for further evaluation.  Upon arrival to the ER he was diaphoretic, tachypneic resp rate 30's, and hr 115-120.  He also endorsed severe epigastric pain.  Lab results revealed serum glucose 235, anion gap 20, wbc 15.2, hgb 19.4, platelets 112, troponin <0.03, and triglycerides 402.  CT Abd Pelvis revealed acute pancreatitis.  Lab results ruled pt in for DKA, therefore insulin gtt initiated. The pt has not been compliant with his diabetes medications in at least a week. He also endorses ETOH abuse most recent drink 03/19/18.  He was subsequently admitted to the stepdown unit for additional workup and  treatment.    PAST MEDICAL HISTORY :   has a past medical history of Diabetes mellitus without complication (HCC) and Hypertension.  has a past surgical history that includes none. Prior to Admission medications   Medication Sig Start Date End Date Taking? Authorizing Provider  carvedilol (COREG) 25 MG tablet Take 25 mg by mouth 2 (two) times daily. 11/07/17  Yes [provider]  [employer] D3 5000 units capsule Take 5,000 Units by mouth daily.  11/28/16  Yes [provider]  KOMBIGLYZE XR 07-998 MG TB24 Take 1 tablet by mouth 2 (two) times daily. 11/25/17  Yes Shaune Pollackhen, Qing, MD  Omega-3 Fatty Acids (FISH OIL ODOR-LESS) 1200 MG CAPS Take 1,200 mg by mouth daily.   Yes [provider]  rosuvastatin (CRESTOR) 10 MG tablet Take 10 mg daily by mouth. 01/10/17  Yes [provider]  sildenafil (VIAGRA) 100 MG tablet Take 100 mg by mouth as needed for erectile dysfunction.  01/10/17  Yes [provider]   Allergies  Allergen Reactions  . Other     Lima beans  Per testing.  . Shellfish Allergy      Cannot recall reaction,Per your allergy testing showed that a allergy to shellfish.     FAMILY HISTORY:  family history includes Congestive Heart Failure in his father; Diabetes in his father and mother; Hypertension in his father and mother. SOCIAL HISTORY:  reports that he has been smoking cigarettes. He started smoking about 21 years ago. He has a 15.00 pack-year smoking history. He has never used  smokeless tobacco. He reports previous alcohol use. He reports previous drug use.  REVIEW OF SYSTEMS: Positives in BOLD  Constitutional: fever, chills, weight loss, malaise/fatigue and diaphoresis.  HENT: Negative for hearing loss, ear pain, nosebleeds, congestion, sore throat, neck pain, tinnitus and ear discharge.   Eyes: Negative for blurred vision, double vision, photophobia, pain, discharge and redness.  Respiratory: Negative for cough, hemoptysis, sputum production,  shortness of breath, wheezing and stridor.   Cardiovascular: Negative for chest pain, palpitations, orthopnea, claudication, leg swelling and PND.  Gastrointestinal: heartburn, nausea, vomiting, abdominal pain, diarrhea, constipation, blood in stool and melena.  Genitourinary: Negative for dysuria, urgency, frequency, hematuria and flank pain.  Musculoskeletal: Negative for myalgias, back pain, joint pain and falls.  Skin: Negative for itching and rash.  Neurological: Negative for dizziness, tingling, tremors, sensory change, speech change, focal weakness, seizures, loss of consciousness, weakness and headaches.  Endo/Heme/Allergies: Negative for environmental allergies and polydipsia. Does not bruise/bleed easily.  SUBJECTIVE:  c/o severe abdominal pain   VITAL SIGNS: Pulse Rate:  [108-123] 121 (01/01 1945) Resp:  [16-23] 23 (01/01 1945) BP: (152-186)/(106-134) 186/106 (01/01 1945) SpO2:  [90 %-98 %] 90 % (01/01 1945) Weight:  [108.9 kg] 108.9 kg (01/01 1322)  PHYSICAL EXAMINATION: General: well developed, well nourished male, NAD  Neuro: alert and oriented, follows commands HEENT: supple, no JVD  Cardiovascular: sinus tach, no R/G Lungs: clear throughout, even, non labored  Abdomen: hypoactive BS x4, obese, severe abdominal tenderness, mildly distended  Musculoskeletal: normal bulk and tone, no edema  Skin: intact no rashes or lesions   Recent Labs  Lab 03/20/18 1350 03/20/18 1620  NA 141 140  K 3.5 3.7  CL 99 101  CO2 22 18*  BUN 11 12  CREATININE 0.95 0.89  GLUCOSE 235* 201*   Recent Labs  Lab 03/20/18 1350 03/20/18 1620  HGB 19.4* 19.7*  HCT 56.6* 58.9*  WBC 15.2* 17.4*  PLT 112* 124*   Ct Abdomen Pelvis W Contrast  Result Date: 03/20/2018 CLINICAL DATA:  Hyperglycemia, nausea vomiting EXAM: CT ABDOMEN AND PELVIS WITH CONTRAST TECHNIQUE: Multidetector CT imaging of the abdomen and pelvis was performed using the standard protocol following bolus administration  of intravenous contrast. CONTRAST:  ISOVUE-300 IOPAMIDOL (ISOVUE-300) INJECTION 61% COMPARISON:  08/15/2006 UGI FINDINGS: Lower chest: Lung bases demonstrate no consolidation or pleural effusion. Borderline cardiomegaly. Coronary calcifications. Small hiatal hernia Hepatobiliary: Steatosis. No calcified gallstone or biliary dilatation Pancreas: Severe peripancreatic inflammatory changes consistent with pancreatitis. Moderate fluid within the mesentery and pararenal spaces. No ductal dilatation. Spleen: Normal in size without focal abnormality. Adrenals/Urinary Tract: Adrenal glands are normal. No hydronephrosis. Subcentimeter hypodensity mid left kidney too small to further characterize. Bladder normal Stomach/Bowel: Stomach is within normal limits. Appendix appears normal. Wall thickening of the anterior wall of the second and third portions of the duodenum. Vascular/Lymphatic: Mild aortic atherosclerosis without aneurysm. Normal portal vein, mesenteric vein, and splenic vein enhancement. Mildly prominent peripancreatic lymph nodes best seen on coronal views. Reproductive: Prostate is unremarkable. Other: Negative for free air. Small free fluid within the upper abdomen. Oval calcification within the central mesentery, possible calcified node. Musculoskeletal: No acute or suspicious abnormality IMPRESSION: 1. Marked edema and fluid surrounding the pancreas, consistent with acute pancreatitis. No organized fluid collections or evidence for necrosis at this time. 2. Anterior wall thickening of the second and third portion of duodenum, likely reactive/due to inflammatory changes in the pancreas. 3. Hepatic steatosis Electronically Signed   By: Adrian Prows.D.  On: 03/20/2018 19:18    ASSESSMENT / PLAN:  Diabetic Ketoacidosis secondary to medication noncompliance  Continue insulin gtt until anion gap closed and serum CO2 >20  BMP q4hrs and CBG's q1hr while on insulin gtt  IV fluids per DKA protocol    Hemoglobin A1c pending  Diabetes coordinator consulted appreciate input  Educated pt regarding importance of compliance with diabetes medications   Hypertriglyceridemia Hx: Hyperlipidemia  Once able to tolerate po's will start fenofibrate and pts home rosuvastatin  Monitor triglycerides and lipase Continuous telemetry monitoring   Acute pancreatitis likely secondary to ETOH abuse  Acute pain  Prn dilaudid for pain management Continue CIWA protocol  ETOH abuse cessation counseling provided   Sonda Rumble, AGNP  Pulmonary/Critical Care Pager [phone] (please enter 7 digits) PCCM Consult Pager [phone] (please enter 7 digits)

## 2018-03-20 NOTE — ED Notes (Signed)
Pt unhooked from monitor so he could ambulate to the bathroom. Pt ambulated to bathroom without difficulty.

## 2018-03-20 NOTE — ED Triage Notes (Signed)
PT from UC via EMS with n/v, states UC sent him here for hyperglycemia of 235 . PT diaphoretic, appear uncomfortable.

## 2018-03-20 NOTE — ED Notes (Signed)
Pt placed on 2L [place] for a O2 saturation of 89%, denies any SOB, pt is shallow breathing r/t abd pain. Pain medication has been administered.

## 2018-03-20 NOTE — ED Triage Notes (Signed)
Patient c/o nausea, vomiting, diarrhea and abdominal pain that started last night. He called EMS this afternoon and they told him it was likely a stomach virus.

## 2018-03-20 NOTE — ED Provider Notes (Addendum)
[facility] Emergency Department Provider Note  Time seen: 5:11 PM  I have reviewed the triage vital signs and the nursing notes.   HISTORY  Chief Complaint Nausea and Emesis    HPI Randy MeckelKevin W Kaufhold is a 53 y.o. male with a past medical history of diabetes, hypertension, presents to the emergency department for acute onset of upper abdominal pain nausea vomiting beginning last night.  According to the patient last night he developed acute onset upper abdominal pain which has progressively worsened and now is moderate to severe.  Upon arrival patient was diaphoretic per triage nurse and brought back to a room.  Upon my evaluation no longer diaphoretic but describes moderate to severe aching pain across the upper abdomen mostly in the epigastrium.  Denies any fever, states he has been nauseated with a occasional vomiting since last night.  Small amount of diarrhea last night but no bowel movement today per patient.  No dysuria or hematuria.  No chest pain or trouble breathing.   Past Medical History:  Diagnosis Date  . Diabetes mellitus without complication (HCC)   . Hypertension     Patient Active Problem List   Diagnosis Date Noted  . DKA, type 1 (HCC) 11/23/2017  . Fatigue 11/19/2017  . Abnormal weight loss 11/19/2017    History reviewed. No pertinent surgical history.  Prior to Admission medications   Medication Sig Start Date End Date Taking? Authorizing Provider  carvedilol (COREG) 25 MG tablet Take 25 mg by mouth 2 (two) times daily. 11/07/17   [provider]  [employer] D3 5000 units capsule Take 5,000 Units by mouth daily.  11/28/16   [provider]  fenofibrate 160 MG tablet Take 160 mg daily by mouth. 11/22/16   [provider]  INVOKANA 300 MG TABS tablet Take 1 tablet (300 mg total) by mouth daily before breakfast. 11/25/17   Shaune Pollackhen, Qing, MD  KOMBIGLYZE XR 07-998 MG TB24 Take 1 tablet by mouth 2 (two) times daily. 11/25/17   Shaune Pollackhen,  Qing, MD  nystatin (MYCOSTATIN) 100000 UNIT/ML suspension Take 5 mLs (500,000 Units total) by mouth 4 (four) times daily. 11/19/17   Flinchum, Eula FriedMichelle S, FNP  rosuvastatin (CRESTOR) 10 MG tablet Take 10 mg daily by mouth. 01/10/17   [provider]  sildenafil (VIAGRA) 100 MG tablet Take 100 mg by mouth as needed for erectile dysfunction.  01/10/17   [provider]    Allergies  Allergen Reactions  . Other     Lima beans  Per testing.  . Shellfish Allergy      Cannot recall reaction,Per your allergy testing showed that a allergy to shellfish.     Family History  Problem Relation Age of Onset  . Diabetes Mother   . Hypertension Mother   . Congestive Heart Failure Father   . Diabetes Father   . Hypertension Father     Social History Social History   Tobacco Use  . Smoking status: Current Every Day Smoker    Packs/day: 0.75    Years: 20.00    Pack years: 15.00    Types: Cigarettes    Start date: 01/29/1997  . Smokeless tobacco: Never Used  . Tobacco comment: Refer to [facility]  Substance Use Topics  . Alcohol use: Not Currently  . Drug use: Not Currently    Review of Systems Constitutional: Negative for fever. Cardiovascular: Negative for chest pain. Respiratory: Negative for shortness of breath. Gastrointestinal: Positive for epigastric/upper abdominal pain.  Positive  for nausea vomiting. Genitourinary: Negative for urinary compaints Musculoskeletal: Negative for musculoskeletal complaints Skin: Negative for skin complaints  Neurological: Negative for headache All other ROS negative  ____________________________________________   PHYSICAL EXAM:  VITAL SIGNS: ED Triage Vitals  Enc Vitals Group     BP 03/20/18 1615 (!) 156/106     Pulse Rate 03/20/18 1615 (!) 114     Resp 03/20/18 1615 16     Temp --      Temp Source 03/20/18 1615 Oral     SpO2 03/20/18 1615 98 %     Weight --      Height --      Head Circumference --      Peak Flow  --      Pain Score 03/20/18 1618 9     Pain Loc --      Pain Edu? --      Excl. in GC? --    Constitutional: Alert and oriented. Well appearing and in no distress. Eyes: Normal exam ENT   Head: Normocephalic and atraumatic.   Mouth/Throat: Mucous membranes are moist. Cardiovascular: Normal rate, regular rhythm. No murmur Respiratory: Normal respiratory effort without tachypnea nor retractions. Breath sounds are clear Gastrointestinal: Soft and nontender. No distention.  Musculoskeletal: Nontender with normal range of motion in all extremities.  Neurologic:  Normal speech and language. No gross focal neurologic deficits Skin:  Skin is warm, dry and intact.  Psychiatric: Mood and affect are normal.  ____________________________________________    EKG  EKG viewed and interpreted by myself shows sinus tachycardia 114 bpm with a narrow QRS, left axis deviation, largely normal intervals nonspecific ST changes.  ____________________________________________    RADIOLOGY  CT consistent with acute pancreatitis.  ____________________________________________   INITIAL IMPRESSION / ASSESSMENT AND PLAN / ED COURSE  Pertinent labs & imaging results that were available during my care of the patient were reviewed by me and considered in my medical decision making (see chart for details).  Patient presents to the emergency department for acute onset of upper abdominal pain starting last night along with nausea vomiting.  Differential is quite broad but would include pancreatitis, patient does admit to near daily alcohol use, cholecystitis or biliary colic, gastritis, gastric or peptic ulcer disease, intra-abdominal infection.  Patient's lab work has begun to result showing a significant leukocytosis 17,000 as well as an elevated hemoglobin and hematocrit of 19.7 which appears to be new for the patient.  We will begin with IV hydration, treat pain and nausea, obtain CT imaging with  contrast.  Patient agreeable to plan of care.  Patient's labs have resulted showing a significant lipase elevation consistent with acute pancreatitis.  Patient's labs also show an anion gap of 21 with a glucose of 201.  Patient does admit to daily alcohol use but did not drink last night due to nausea vomiting.  Possibly could represent alcoholic ketoacidosis as well.  We will continue with IV hydration continue with IV pain medication.  I discussed with the hospitalist for admission we will obtain CT imaging as a precaution.  CT consistent with acute pancreatitis.  Admitted to the hospital service.  Given the VBG is 7.16.  It is unclear if this is alcoholic ketoacidosis versus diabetic ketoacidosis.  Patient has a significant anion gap of 21.  We will start the patient on D5, in addition to an insulin drip until his anion gap closes.  We will continue to monitor in the emergency department.  Patient will require likely stepdown  admission.  CRITICAL CARE Performed by: Minna Antis   Total critical care time: 45 minutes  Critical care time was exclusive of separately billable procedures and treating other patients.  Critical care was necessary to treat or prevent imminent or life-threatening deterioration.  Critical care was time spent personally by me on the following activities: development of treatment plan with patient and/or surrogate as well as nursing, discussions with consultants, evaluation of patient's response to treatment, examination of patient, obtaining history from patient or surrogate, ordering and performing treatments and interventions, ordering and review of laboratory studies, ordering and review of radiographic studies, pulse oximetry and re-evaluation of patient's condition.   ____________________________________________   FINAL CLINICAL IMPRESSION(S) / ED DIAGNOSES  Abdominal pain Acute pancreatitis Ketoacidosis   Minna Antis, MD 03/20/18 Chilton Greathouse, MD 03/20/18 [phone]

## 2018-03-21 ENCOUNTER — Inpatient Hospital Stay: Payer: Self-pay

## 2018-03-21 ENCOUNTER — Inpatient Hospital Stay: Payer: BLUE CROSS/BLUE SHIELD

## 2018-03-21 DIAGNOSIS — E1110 Type 2 diabetes mellitus with ketoacidosis without coma: Secondary | ICD-10-CM

## 2018-03-21 DIAGNOSIS — K852 Alcohol induced acute pancreatitis without necrosis or infection: Secondary | ICD-10-CM

## 2018-03-21 DIAGNOSIS — J9601 Acute respiratory failure with hypoxia: Secondary | ICD-10-CM

## 2018-03-21 DIAGNOSIS — J9600 Acute respiratory failure, unspecified whether with hypoxia or hypercapnia: Secondary | ICD-10-CM

## 2018-03-21 LAB — BASIC METABOLIC PANEL
Anion gap: 8 (ref 5–15)
Anion gap: 8 (ref 5–15)
Anion gap: 8 (ref 5–15)
Anion gap: 9 (ref 5–15)
BUN: 18 mg/dL (ref 6–20)
BUN: 22 mg/dL — ABNORMAL HIGH (ref 6–20)
BUN: 26 mg/dL — ABNORMAL HIGH (ref 6–20)
BUN: 31 mg/dL — ABNORMAL HIGH (ref 6–20)
CO2: 20 mmol/L — AB (ref 22–32)
CO2: 20 mmol/L — ABNORMAL LOW (ref 22–32)
CO2: 19 mmol/L — ABNORMAL LOW (ref 22–32)
CO2: 19 mmol/L — ABNORMAL LOW (ref 22–32)
Calcium: 7.6 mg/dL — ABNORMAL LOW (ref 8.9–10.3)
Calcium: 7.5 mg/dL — ABNORMAL LOW (ref 8.9–10.3)
Calcium: 7.1 mg/dL — ABNORMAL LOW (ref 8.9–10.3)
Calcium: 7.1 mg/dL — ABNORMAL LOW (ref 8.9–10.3)
Chloride: 106 mmol/L (ref 98–111)
Chloride: 106 mmol/L (ref 98–111)
Chloride: 108 mmol/L (ref 98–111)
Chloride: 108 mmol/L (ref 98–111)
Creatinine, Ser: 1.08 mg/dL (ref 0.61–1.24)
Creatinine, Ser: 1.81 mg/dL — ABNORMAL HIGH (ref 0.61–1.24)
Creatinine, Ser: 2.4 mg/dL — ABNORMAL HIGH (ref 0.61–1.24)
Creatinine, Ser: 3.19 mg/dL — ABNORMAL HIGH (ref 0.61–1.24)
GFR calc Af Amer: 49 mL/min — ABNORMAL LOW (ref >60)
GFR calc Af Amer: 35 mL/min — ABNORMAL LOW (ref >60)
GFR calc Af Amer: 25 mL/min — ABNORMAL LOW (ref >60)
GFR calc non Af Amer: >60 mL/min (ref >60)
GFR calc non Af Amer: 43 mL/min — ABNORMAL LOW (ref >60)
GFR calc non Af Amer: 30 mL/min — ABNORMAL LOW (ref >60)
GFR calc non Af Amer: 21 mL/min — ABNORMAL LOW (ref >60)
Glucose, Bld: 136 mg/dL — ABNORMAL HIGH (ref 70–99)
Glucose, Bld: 155 mg/dL — ABNORMAL HIGH (ref 70–99)
Glucose, Bld: 121 mg/dL — ABNORMAL HIGH (ref 70–99)
Glucose, Bld: 96 mg/dL (ref 70–99)
Potassium: 4.7 mmol/L (ref 3.5–5.1)
Potassium: 5.3 mmol/L — ABNORMAL HIGH (ref 3.5–5.1)
Potassium: 5.5 mmol/L — ABNORMAL HIGH (ref 3.5–5.1)
Potassium: 5.6 mmol/L — ABNORMAL HIGH (ref 3.5–5.1)
SODIUM: 134 mmol/L — AB (ref 135–145)
SODIUM: 135 mmol/L (ref 135–145)
Sodium: 134 mmol/L — ABNORMAL LOW (ref 135–145)
Sodium: 136 mmol/L (ref 135–145)

## 2018-03-21 LAB — GLUCOSE, CAPILLARY
GLUCOSE-CAPILLARY: 113 mg/dL — AB (ref 70–99)
GLUCOSE-CAPILLARY: 133 mg/dL — AB (ref 70–99)
GLUCOSE-CAPILLARY: 264 mg/dL — AB (ref 70–99)
GLUCOSE-CAPILLARY: 330 mg/dL — AB (ref 70–99)
GLUCOSE-CAPILLARY: 93 mg/dL (ref 70–99)
Glucose-Capillary: 115 mg/dL — ABNORMAL HIGH (ref 70–99)
Glucose-Capillary: 121 mg/dL — ABNORMAL HIGH (ref 70–99)
Glucose-Capillary: 139 mg/dL — ABNORMAL HIGH (ref 70–99)
Glucose-Capillary: 144 mg/dL — ABNORMAL HIGH (ref 70–99)
Glucose-Capillary: 147 mg/dL — ABNORMAL HIGH (ref 70–99)
Glucose-Capillary: 148 mg/dL — ABNORMAL HIGH (ref 70–99)
Glucose-Capillary: 148 mg/dL — ABNORMAL HIGH (ref 70–99)
Glucose-Capillary: 150 mg/dL — ABNORMAL HIGH (ref 70–99)
Glucose-Capillary: 157 mg/dL — ABNORMAL HIGH (ref 70–99)
Glucose-Capillary: 157 mg/dL — ABNORMAL HIGH (ref 70–99)
Glucose-Capillary: 176 mg/dL — ABNORMAL HIGH (ref 70–99)
Glucose-Capillary: 193 mg/dL — ABNORMAL HIGH (ref 70–99)
Glucose-Capillary: 200 mg/dL — ABNORMAL HIGH (ref 70–99)
Glucose-Capillary: 250 mg/dL — ABNORMAL HIGH (ref 70–99)
Glucose-Capillary: 90 mg/dL (ref 70–99)
Glucose-Capillary: 91 mg/dL (ref 70–99)
Glucose-Capillary: 91 mg/dL (ref 70–99)
Glucose-Capillary: 92 mg/dL (ref 70–99)
Glucose-Capillary: 98 mg/dL (ref 70–99)

## 2018-03-21 LAB — INFLUENZA PANEL BY PCR (TYPE A & B)
Influenza A By PCR: NEGATIVE
Influenza B By PCR: NEGATIVE

## 2018-03-21 LAB — URINE DRUG SCREEN, QUALITATIVE (ARMC ONLY)
Amphetamines, Ur Screen: NOT DETECTED
Barbiturates, Ur Screen: NOT DETECTED
Benzodiazepine, Ur Scrn: NOT DETECTED
Cannabinoid 50 Ng, Ur ~~LOC~~: NOT DETECTED
Cocaine Metabolite,Ur ~~LOC~~: NOT DETECTED
MDMA (Ecstasy)Ur Screen: NOT DETECTED
Methadone Scn, Ur: NOT DETECTED
Opiate, Ur Screen: POSITIVE — AB
Phencyclidine (PCP) Ur S: NOT DETECTED
TRICYCLIC, UR SCREEN: NOT DETECTED

## 2018-03-21 LAB — BLOOD GAS, ARTERIAL
Acid-base deficit: 8 mmol/L — ABNORMAL HIGH (ref 0.0–2.0)
Acid-base deficit: 6.7 mmol/L — ABNORMAL HIGH (ref 0.0–2.0)
BICARBONATE: 20.9 mmol/L (ref 20.0–28.0)
Bicarbonate: 20.6 mmol/L (ref 20.0–28.0)
FIO2: 1
FIO2: 1
MECHVT: 500 mL
MECHVT: 500 mL
O2 SAT: 94.7 %
O2 Saturation: 90.2 %
PCO2 ART: 47 mmHg (ref 32.0–48.0)
PEEP/CPAP: 5 cmH2O
PEEP: 5 cmH2O
Patient temperature: 37
Patient temperature: 37
RATE: 20 resp/min
RATE: 24 resp/min
pCO2 arterial: 56 mmHg — ABNORMAL HIGH (ref 32.0–48.0)
pH, Arterial: 7.18 — CL (ref 7.350–7.450)
pH, Arterial: 7.25 — ABNORMAL LOW (ref 7.350–7.450)
pO2, Arterial: 74 mmHg — ABNORMAL LOW (ref 83.0–108.0)
pO2, Arterial: 86 mmHg (ref 83.0–108.0)

## 2018-03-21 LAB — COMPREHENSIVE METABOLIC PANEL
ALT: 34 U/L (ref 0–44)
AST: 57 U/L — ABNORMAL HIGH (ref 15–41)
Albumin: 3.4 g/dL — ABNORMAL LOW (ref 3.5–5.0)
Alkaline Phosphatase: 50 U/L (ref 38–126)
Anion gap: 11 (ref 5–15)
BUN: 14 mg/dL (ref 6–20)
CO2: 18 mmol/L — ABNORMAL LOW (ref 22–32)
Calcium: 7.8 mg/dL — ABNORMAL LOW (ref 8.9–10.3)
Chloride: 107 mmol/L (ref 98–111)
Creatinine, Ser: 0.96 mg/dL (ref 0.61–1.24)
GFR calc Af Amer: >60 mL/min (ref >60)
GFR calc non Af Amer: >60 mL/min (ref >60)
GLUCOSE: 186 mg/dL — AB (ref 70–99)
Potassium: 4.6 mmol/L (ref 3.5–5.1)
Sodium: 136 mmol/L (ref 135–145)
Total Bilirubin: 1.4 mg/dL — ABNORMAL HIGH (ref 0.3–1.2)
Total Protein: 6.2 g/dL — ABNORMAL LOW (ref 6.5–8.1)

## 2018-03-21 LAB — CBC
HCT: 55.5 % — ABNORMAL HIGH (ref 39.0–52.0)
HEMATOCRIT: 52.9 % — AB (ref 39.0–52.0)
HEMOGLOBIN: 18 g/dL — AB (ref 13.0–17.0)
Hemoglobin: 18.7 g/dL — ABNORMAL HIGH (ref 13.0–17.0)
MCH: 33.7 pg (ref 26.0–34.0)
MCH: 33.6 pg (ref 26.0–34.0)
MCHC: 34 g/dL (ref 30.0–36.0)
MCHC: 33.7 g/dL (ref 30.0–36.0)
MCV: 99.1 fL (ref 80.0–100.0)
MCV: 99.8 fL (ref 80.0–100.0)
Platelets: 84 10*3/uL — ABNORMAL LOW (ref 150–400)
Platelets: 94 10*3/uL — ABNORMAL LOW (ref 150–400)
RBC: 5.34 MIL/uL (ref 4.22–5.81)
RBC: 5.56 MIL/uL (ref 4.22–5.81)
RDW: 12.3 % (ref 11.5–15.5)
RDW: 12.4 % (ref 11.5–15.5)
WBC: 9.8 10*3/uL (ref 4.0–10.5)
WBC: 9.6 10*3/uL (ref 4.0–10.5)
nRBC: 0 % (ref 0.0–0.2)
nRBC: 0 % (ref 0.0–0.2)

## 2018-03-21 LAB — MRSA PCR SCREENING: MRSA by PCR: NEGATIVE

## 2018-03-21 LAB — TRIGLYCERIDES
Triglycerides: 701 mg/dL — ABNORMAL HIGH (ref <150)
Triglycerides: 764 mg/dL — ABNORMAL HIGH (ref <150)
Triglycerides: 720 mg/dL — ABNORMAL HIGH (ref <150)

## 2018-03-21 MED ORDER — ROSUVASTATIN CALCIUM 10 MG PO TABS: 10.0000 mg | ORAL_TABLET | Freq: Every day | ORAL | Status: DC

## 2018-03-21 MED ORDER — MIDAZOLAM HCL 2 MG/2ML IJ SOLN
4.0000 mg | INTRAMUSCULAR | Status: AC
  Administered 2018-03-21: 4 mg via INTRAVENOUS
  Filled 2018-03-21: qty 4

## 2018-03-21 MED ORDER — SODIUM CHLORIDE 0.9 % IV BOLUS
1000.0000 mL | Freq: Once | INTRAVENOUS | Status: AC
  Administered 2018-03-21: 1000 mL via INTRAVENOUS

## 2018-03-21 MED ORDER — CHLORHEXIDINE GLUCONATE 0.12% ORAL RINSE (MEDLINE KIT)
15.0000 mL | Freq: Two times a day (BID) | OROMUCOSAL | Status: DC
  Administered 2018-03-21 – 2018-03-22 (×3): 15 mL via OROMUCOSAL

## 2018-03-21 MED ORDER — SODIUM BICARBONATE 8.4 % IV SOLN
100.0000 meq | Freq: Once | INTRAVENOUS | Status: AC
  Administered 2018-03-21: 100 meq via INTRAVENOUS
  Filled 2018-03-21: qty 100

## 2018-03-21 MED ORDER — FENTANYL 2500MCG IN NS 250ML (10MCG/ML) PREMIX INFUSION: 0.0000 ug/h | INTRAVENOUS | Status: DC

## 2018-03-21 MED ORDER — FENTANYL CITRATE (PF) 100 MCG/2ML IJ SOLN
100.0000 ug | INTRAMUSCULAR | Status: AC
  Administered 2018-03-21: 100 ug via INTRAVENOUS
  Filled 2018-03-21: qty 2

## 2018-03-21 MED ORDER — LORAZEPAM 2 MG/ML IJ SOLN
INTRAMUSCULAR | Status: AC
  Administered 2018-03-21: 11:00:00
  Filled 2018-03-21: qty 1

## 2018-03-21 MED ORDER — SODIUM POLYSTYRENE SULFONATE 15 GM/60ML PO SUSP: 15.0000 g | Freq: Once | ORAL | Status: DC

## 2018-03-21 MED ORDER — ORAL CARE MOUTH RINSE
15.0000 mL | OROMUCOSAL | Status: DC
  Administered 2018-03-21 – 2018-03-22 (×12): 15 mL via OROMUCOSAL

## 2018-03-21 MED ORDER — LORAZEPAM 2 MG/ML IJ SOLN
2.0000 mg | INTRAMUSCULAR | Status: DC | PRN
  Administered 2018-03-21 – 2018-03-22 (×3): 2 mg via INTRAVENOUS
  Filled 2018-03-21 (×2): qty 1

## 2018-03-21 MED ORDER — ORAL CARE MOUTH RINSE: 15.0000 mL | OROMUCOSAL | Status: DC

## 2018-03-21 MED ORDER — VECURONIUM BROMIDE 10 MG IV SOLR
20.0000 mg | INTRAVENOUS | Status: AC
  Administered 2018-03-21: 20 mg via INTRAVENOUS

## 2018-03-21 MED ORDER — SODIUM POLYSTYRENE SULFONATE 15 GM/60ML PO SUSP
45.0000 g | Freq: Once | ORAL | Status: AC
  Administered 2018-03-22: 45 g
  Filled 2018-03-21: qty 180

## 2018-03-21 MED ORDER — CHLORHEXIDINE GLUCONATE 0.12% ORAL RINSE (MEDLINE KIT): 15.0000 mL | Freq: Two times a day (BID) | OROMUCOSAL | Status: DC

## 2018-03-21 MED ORDER — FENOFIBRATE 160 MG PO TABS
160.0000 mg | ORAL_TABLET | Freq: Every day | ORAL | Status: DC
  Administered 2018-03-21: 160 mg via ORAL
  Filled 2018-03-21 (×3): qty 1

## 2018-03-21 MED ORDER — FENTANYL 2500MCG IN NS 250ML (10MCG/ML) PREMIX INFUSION
0.0000 ug/h | INTRAVENOUS | Status: DC
  Administered 2018-03-21: 100 ug/h via INTRAVENOUS
  Administered 2018-03-22: 400 ug/h via INTRAVENOUS
  Administered 2018-03-22: 300 ug/h via INTRAVENOUS
  Administered 2018-03-22 (×2): 400 ug/h via INTRAVENOUS
  Filled 2018-03-21 (×5): qty 250

## 2018-03-21 MED ORDER — CALCIUM GLUCONATE-NACL 1-0.675 GM/50ML-% IV SOLN
1.0000 g | Freq: Once | INTRAVENOUS | Status: AC
  Administered 2018-03-22: 1000 mg via INTRAVENOUS
  Filled 2018-03-21: qty 50

## 2018-03-21 MED ORDER — METOPROLOL TARTRATE 5 MG/5ML IV SOLN
2.5000 mg | Freq: Four times a day (QID) | INTRAVENOUS | Status: DC | PRN
  Administered 2018-03-21 (×2): 2.5 mg via INTRAVENOUS
  Administered 2018-03-21: 5 mg via INTRAVENOUS
  Filled 2018-03-21 (×3): qty 5

## 2018-03-21 MED ORDER — SODIUM CHLORIDE 0.9 % IV BOLUS
500.0000 mL | Freq: Once | INTRAVENOUS | Status: AC
  Administered 2018-03-21: 500 mL via INTRAVENOUS

## 2018-03-21 MED ORDER — HYDRALAZINE HCL 20 MG/ML IJ SOLN
10.0000 mg | INTRAMUSCULAR | Status: DC | PRN
  Administered 2018-03-21: 10 mg via INTRAVENOUS
  Filled 2018-03-21: qty 1

## 2018-03-21 MED ORDER — VECURONIUM BROMIDE 10 MG IV SOLR
10.0000 mg | INTRAVENOUS | Status: DC
  Filled 2018-03-21: qty 10

## 2018-03-21 MED ORDER — INSULIN REGULAR(HUMAN) IN NACL 100-0.9 UT/100ML-% IV SOLN
5.0000 [IU]/h | INTRAVENOUS | Status: DC
  Administered 2018-03-21: 5 [IU]/h via INTRAVENOUS

## 2018-03-21 NOTE — Progress Notes (Signed)
[facility] [place] [facility]   PATIENT NAME: Randy DistanceKevin Akerley    MR#:  [id number]  DATE OF BIRTH:  [birth year]  SUBJECTIVE:  CHIEF COMPLAINT: Patient is very lethargic today  REVIEW OF SYSTEMS:  Review of system unobtainable  DRUG ALLERGIES:   Allergies  Allergen Reactions  . Other     Lima beans  Per testing.  . Shellfish Allergy      Cannot recall reaction,Per your allergy testing showed that a allergy to shellfish.     VITALS:  Blood pressure 101/74, pulse (!) 120, temperature 98.8 F (37.1 C), temperature source Oral, resp. rate (!) 29, height 5\' 9"  (1.753 m), weight 109.4 kg, SpO2 94 %.  PHYSICAL EXAMINATION:  GENERAL:  53 y.o.-year-old patient lying in the bed with no acute distress.  EYES: Pupils equal, round, reactive to light and accommodation. No scleral icterus. Extraocular muscles intact.  HEENT: Head atraumatic, normocephalic. Oropharynx and nasopharynx clear.  NECK:  Supple, no jugular venous distention. No thyroid enlargement, no tenderness.  LUNGS: Normal breath sounds bilaterally, no wheezing, rales,rhonchi or crepitation. No use of accessory muscles of respiration.  CARDIOVASCULAR: S1, S2 normal. No murmurs, rubs, or gallops.  ABDOMEN: Soft, nontender, nondistended. Bowel sounds present.  EXTREMITIES: No pedal edema, cyanosis, or clubbing.  NEUROLOGIC: Altered gait not checked.  PSYCHIATRIC: The patient is lethargic SKIN: No obvious rash, lesion, or ulcer.    LABORATORY PANEL:   CBC Recent Labs  Lab 03/21/18 0400  WBC 9.6  HGB 18.7*  HCT 55.5*  PLT 94*   ------------------------------------------------------------------------------------------------------------------  Chemistries  Recent Labs  Lab 03/21/18 0400  03/21/18 1222  NA 136   < > 134*  K 4.6   < > 5.3*  CL 107   < > 106  CO2 18*   < > 20*  GLUCOSE 186*   < > 155*  BUN 14   < > 22*  CREATININE 0.96   < > 1.81*  CALCIUM 7.8*   < > 7.5*  AST 57*   --   --   ALT 34  --   --   ALKPHOS 50  --   --   BILITOT 1.4*  --   --    < > = values in this interval not displayed.   ------------------------------------------------------------------------------------------------------------------  Cardiac Enzymes Recent Labs  Lab 03/20/18 1715  TROPONINI <0.03   ------------------------------------------------------------------------------------------------------------------  RADIOLOGY:  Ct Abdomen Pelvis W Contrast  Result Date: 03/20/2018 CLINICAL DATA:  Hyperglycemia, nausea vomiting EXAM: CT ABDOMEN AND PELVIS WITH CONTRAST TECHNIQUE: Multidetector CT imaging of the abdomen and pelvis was performed using the standard protocol following bolus administration of intravenous contrast. CONTRAST:  100mL ISOVUE-300 IOPAMIDOL (ISOVUE-300) INJECTION 61% COMPARISON:  08/15/2006 UGI FINDINGS: Lower chest: Lung bases demonstrate no consolidation or pleural effusion. Borderline cardiomegaly. Coronary calcifications. Small hiatal hernia Hepatobiliary: Steatosis. No calcified gallstone or biliary dilatation Pancreas: Severe peripancreatic inflammatory changes consistent with pancreatitis. Moderate fluid within the mesentery and pararenal spaces. No ductal dilatation. Spleen: Normal in size without focal abnormality. Adrenals/Urinary Tract: Adrenal glands are normal. No hydronephrosis. Subcentimeter hypodensity mid left kidney too small to further characterize. Bladder normal Stomach/Bowel: Stomach is within normal limits. Appendix appears normal. Wall thickening of the anterior wall of the second and third portions of the duodenum. Vascular/Lymphatic: Mild aortic atherosclerosis without aneurysm. Normal portal vein, mesenteric vein, and splenic vein enhancement. Mildly prominent peripancreatic lymph nodes best seen on coronal views. Reproductive: Prostate is unremarkable. Other: Negative for free air.  Small free fluid within the upper abdomen. Oval calcification within  the central mesentery, possible calcified node. Musculoskeletal: No acute or suspicious abnormality IMPRESSION: 1. Marked edema and fluid surrounding the pancreas, consistent with acute pancreatitis. No organized fluid collections or evidence for necrosis at this time. 2. Anterior wall thickening of the second and third portion of duodenum, likely reactive/due to inflammatory changes in the pancreas. 3. Hepatic steatosis Electronically Signed   By: Jasmine PangKim  Fujinaga M.D.   On: 03/20/2018 19:18    EKG:   Orders placed or performed during the hospital encounter of 03/20/18  . ED EKG  . ED EKG  . EKG 12-Lead  . EKG 12-Lead    ASSESSMENT AND PLAN:    53 year old male patient with history of alcohol use, tobacco use, diabetes mellitus type 2, hypertension presented to the emergency room for abdominal pain, nausea and vomiting  -DKA Low pH and anion gap of 21 Discontinue Kombiglyze Insulin drip Serial BMPs   #Acute kidney injury from dehydration from DKA Aggressive hydration with IV fluids, avoid nephrotoxins and monitor renal function closely  #Hyperkalemia Patient is on insulin drip Kayexalate enema as patient is n.p.o.   -Acute pancreatitis-be from high triglycerides Keep patient n.p.o., continue insulin drip Aggressive IV fluid hydration Monitor lipase leve Triglycerides [phone]  CT abdomen -no pancreatic necrosis and pseudocyst  -Alcohol abuse  on CIWA protocol Thiamine and folic acid supplements Alcohol cessation counseled to the patient  -Tobacco abuse Tobacco cessation counseled to the patient for 6 minutes Nicotine patch offered  --Abdominal pain PRN IV morphine for control of abdominal pain  -DVT prophylaxis subcu Lovenox daily    All the records are reviewed and case discussed with Care Management/Social Workerr. Management plans discussed with the patient, family and they are in agreement.  CODE STATUS: fc   TOTAL TIME TAKING CARE OF THIS PATIENT: 35   minutes.   POSSIBLE D/C IN 2  DAYS, DEPENDING ON CLINICAL CONDITION.  Note: This dictation was prepared with Dragon dictation along with smaller phrase technology. Any transcriptional errors that result from this process are unintentional.   Ramonita LabAruna Terrace Fontanilla M.D on 03/21/2018 at 4:32 PM  Between 7am to 6pm - Pager - [phone] After 6pm go to [email or web address] - password [email or web address] [email or web address]  [facility]  Office  [phone]  CC: Primary care physician; Sherron Mondayejan-Sie, S Ahmed, MD

## 2018-03-21 NOTE — Progress Notes (Addendum)
Inpatient Diabetes Program Recommendations  AACE/ADA: New Consensus Statement on Inpatient Glycemic Control (2019)  Target Ranges:  Prepandial:   less than 140 mg/dL      Peak postprandial:   less than 180 mg/dL (1-2 hours)      Critically ill patients:  140 - 180 mg/dL  Results for Randy Deleon, Randy Deleon (MRN [id number]) as of 03/21/2018 08:20  Ref. Range 03/21/2018 00:55 03/21/2018 01:56 03/21/2018 02:57 03/21/2018 03:53 03/21/2018 04:56 03/21/2018 05:58 03/21/2018 06:56 03/21/2018 08:00  Glucose-Capillary Latest Ref Range: 70 - 99 mg/dL 865264 (H) 784250 (H) 696193 (H) 200 (H) 176 (H) 147 (H) 148 (H) 121 (H)   Results for Randy Deleon, Randy Deleon (MRN [id number]) as of 03/21/2018 08:20  Ref. Range 03/20/2018 13:50  Glucose Latest Ref Range: 70 - 99 mg/dL 440235 (H)   Results for Randy Deleon, Randy Deleon (MRN [id number]) as of 03/21/2018 08:20  Ref. Range 11/20/2017 09:09  Hemoglobin A1C Latest Ref Range: 4.8 - 5.6 % 12.3 (H)   Review of Glycemic Control  Diabetes history: DM2 Outpatient Diabetes medications: Kombiglyze 07-998 mg BID, Invokana 300 mg daily Current orders for Inpatient glycemic control: IV insulin drip per DKA protocol  Inpatient Diabetes Program Recommendations:   Insulin-SQ transition: When MD is ready to transition from IV to SQ insulin, please consider ordering Lantus 25 units Q24H, CBGs Q4H, and Novolog 0-15 units Q4H.  Oral Agents: At time of discharge, recommend discontinuing Kombiglyze and Invokana and have patient take insulin as an outpatient.  HgbA1C: Last A1C 12.3% on 11/20/17 indicating an average glucose of 306 mg/dl. Please order current A1C.  NOTE: In reviewing chart, noted patient was inpatient from 11/23/17 to 11/25/17 with DKA and noted patient refused to take insulin as an outpatient. Patient was noted to have DKA again this admission as well as pancreatitis and is currently on IV insulin drip. Noted patient takes Kombiglyze and Invokana as an outpatient and per H&P patient has not taken DM medications for past  week.  Kombiglyze can cause pancreatitis and Invokana can increase risk of developing DKA. Therefore, would strongly recommend Kombiglyze and Invokana be discontinued as an outpatient and that patient be discharged on insulin for DM control. Will plan to talk with patient today.  Addendum 03/21/18@13 :30-Went by ICU to talk with patient. However, patient appears lethargic and his speech is mumbled. RN notes patient has received Ativan and Dilaudid today which is likely cause of patient being lethargic and having mumbled speech. Will plan to follow up with patient tomorrow and try to talk to him about taking insulin as an outpatient.  Thanks, Orlando PennerMarie Marina Desire, RN, MSN, CDE Diabetes Coordinator Inpatient Diabetes Program [phone] (Team Pager from 8am to 5pm)

## 2018-03-21 NOTE — Progress Notes (Signed)
Patient with increased WOB Severe ABD pain and distention, mental status changes  Family at bedside, I explained patient has multiorgan failure Needs MV support   Endotracheal Intubation: Patient required placement of an artificial airway secondary to Respiratory Failure  Consent: Emergent.   Hand washing performed prior to starting the procedure.   Medications administered for sedation prior to procedure:  Midazolam 4 mg IV,  Vecuronium 20 mg IV, Fentanyl 200 mcg IV.    A time out procedure was called and correct patient, name, & ID confirmed. Needed supplies and equipment were assembled and checked to include ETT, 10 ml syringe, Glidescope, Mac and Miller blades, suction, oxygen and bag mask valve, end tidal CO2 monitor.   Patient was positioned to align the mouth and pharynx to facilitate visualization of the glottis.   Heart rate, SpO2 and blood pressure was continuously monitored during the procedure. Pre-oxygenation was conducted prior to intubation and endotracheal tube was placed through the vocal cords into the trachea.     The artificial airway was placed under direct visualization via glidescope route using a 8.0  ETT on the first attempt.  ETT was secured at 23 cm mark.  Placement was confirmed by auscuitation of lungs with good breath sounds bilaterally and no stomach sounds.  Condensation was noted on endotracheal tube.   Pulse ox 98%.  CO2 detector in place with appropriate color change.   Complications: None .   Operator: Lucina Betty.   Chest radiograph ordered and pending.   Comments: OGT placed via glidescope.  Corrin Parker, M.D.  [facility]  Medical Director [place] Director [facility] Cardio-Pulmonary Department

## 2018-03-21 NOTE — Plan of Care (Signed)
Pt remains on insulin drip at this time and verbalizes understanding of the disease process and plan of care at this time.

## 2018-03-21 NOTE — Progress Notes (Signed)
Received order for PICC insertion.  Informed patients RN will not be able to place until tomorrow am.  Has PIV x 2 at present.  Please call IV team if any problems with PIV during night.

## 2018-03-22 ENCOUNTER — Inpatient Hospital Stay: Payer: BLUE CROSS/BLUE SHIELD

## 2018-03-22 DIAGNOSIS — K859 Acute pancreatitis without necrosis or infection, unspecified: Secondary | ICD-10-CM

## 2018-03-22 DIAGNOSIS — N179 Acute kidney failure, unspecified: Secondary | ICD-10-CM

## 2018-03-22 LAB — BASIC METABOLIC PANEL
Anion gap: 9 (ref 5–15)
Anion gap: 10 (ref 5–15)
Anion gap: 8 (ref 5–15)
BUN: 33 mg/dL — ABNORMAL HIGH (ref 6–20)
BUN: 35 mg/dL — ABNORMAL HIGH (ref 6–20)
BUN: 36 mg/dL — AB (ref 6–20)
CO2: 21 mmol/L — ABNORMAL LOW (ref 22–32)
CO2: 22 mmol/L (ref 22–32)
CO2: 21 mmol/L — ABNORMAL LOW (ref 22–32)
CREATININE: 3.45 mg/dL — AB (ref 0.61–1.24)
CREATININE: 4.11 mg/dL — AB (ref 0.61–1.24)
Calcium: 6.6 mg/dL — ABNORMAL LOW (ref 8.9–10.3)
Calcium: 6.8 mg/dL — ABNORMAL LOW (ref 8.9–10.3)
Calcium: 6.3 mg/dL — CL (ref 8.9–10.3)
Chloride: 108 mmol/L (ref 98–111)
Chloride: 107 mmol/L (ref 98–111)
Chloride: 110 mmol/L (ref 98–111)
Creatinine, Ser: 3.86 mg/dL — ABNORMAL HIGH (ref 0.61–1.24)
GFR calc Af Amer: 20 mL/min — ABNORMAL LOW (ref >60)
GFR calc Af Amer: 18 mL/min — ABNORMAL LOW (ref >60)
GFR calc non Af Amer: 20 mL/min — ABNORMAL LOW (ref >60)
GFR calc non Af Amer: 16 mL/min — ABNORMAL LOW (ref >60)
GFR, EST AFRICAN AMERICAN: 23 mL/min — AB (ref >60)
GFR, EST NON AFRICAN AMERICAN: 17 mL/min — AB (ref >60)
Glucose, Bld: 88 mg/dL (ref 70–99)
Glucose, Bld: 80 mg/dL (ref 70–99)
Glucose, Bld: 72 mg/dL (ref 70–99)
Potassium: 5 mmol/L (ref 3.5–5.1)
Potassium: 4.5 mmol/L (ref 3.5–5.1)
Potassium: 3.9 mmol/L (ref 3.5–5.1)
Sodium: 138 mmol/L (ref 135–145)
Sodium: 139 mmol/L (ref 135–145)
Sodium: 139 mmol/L (ref 135–145)

## 2018-03-22 LAB — GLUCOSE, CAPILLARY
GLUCOSE-CAPILLARY: 76 mg/dL (ref 70–99)
Glucose-Capillary: 101 mg/dL — ABNORMAL HIGH (ref 70–99)
Glucose-Capillary: 93 mg/dL (ref 70–99)
Glucose-Capillary: 97 mg/dL (ref 70–99)
Glucose-Capillary: 58 mg/dL — ABNORMAL LOW (ref 70–99)
Glucose-Capillary: 98 mg/dL (ref 70–99)
Glucose-Capillary: 66 mg/dL — ABNORMAL LOW (ref 70–99)
Glucose-Capillary: 148 mg/dL — ABNORMAL HIGH (ref 70–99)
Glucose-Capillary: 157 mg/dL — ABNORMAL HIGH (ref 70–99)
Glucose-Capillary: 169 mg/dL — ABNORMAL HIGH (ref 70–99)
Glucose-Capillary: 168 mg/dL — ABNORMAL HIGH (ref 70–99)
Glucose-Capillary: 174 mg/dL — ABNORMAL HIGH (ref 70–99)
Glucose-Capillary: 200 mg/dL — ABNORMAL HIGH (ref 70–99)

## 2018-03-22 LAB — BLOOD GAS, ARTERIAL
Acid-base deficit: 3.3 mmol/L — ABNORMAL HIGH (ref 0.0–2.0)
Bicarbonate: 22.1 mmol/L (ref 20.0–28.0)
FIO2: 1
MECHVT: 500 mL
O2 Saturation: 97.1 %
PCO2 ART: 40 mmHg (ref 32.0–48.0)
PEEP: 5 cmH2O
Patient temperature: 38.1
RATE: 24 resp/min
pH, Arterial: 7.33 — ABNORMAL LOW (ref 7.350–7.450)
pO2, Arterial: 102 mmHg (ref 83.0–108.0)

## 2018-03-22 LAB — MAGNESIUM
Magnesium: 1.6 mg/dL — ABNORMAL LOW (ref 1.7–2.4)
Magnesium: 1.4 mg/dL — ABNORMAL LOW (ref 1.7–2.4)
Magnesium: 1.8 mg/dL (ref 1.7–2.4)
Magnesium: 1.8 mg/dL (ref 1.7–2.4)

## 2018-03-22 LAB — RENAL FUNCTION PANEL
ALBUMIN: 2.5 g/dL — AB (ref 3.5–5.0)
Albumin: 2.3 g/dL — ABNORMAL LOW (ref 3.5–5.0)
Albumin: 2.2 g/dL — ABNORMAL LOW (ref 3.5–5.0)
Anion gap: 10 (ref 5–15)
Anion gap: 9 (ref 5–15)
Anion gap: 9 (ref 5–15)
BUN: 38 mg/dL — ABNORMAL HIGH (ref 6–20)
BUN: 35 mg/dL — AB (ref 6–20)
BUN: 34 mg/dL — ABNORMAL HIGH (ref 6–20)
CALCIUM: 6.3 mg/dL — AB (ref 8.9–10.3)
CHLORIDE: 111 mmol/L (ref 98–111)
CO2: 21 mmol/L — ABNORMAL LOW (ref 22–32)
CO2: 22 mmol/L (ref 22–32)
CO2: 18 mmol/L — ABNORMAL LOW (ref 22–32)
CREATININE: 4.27 mg/dL — AB (ref 0.61–1.24)
CREATININE: 3.56 mg/dL — AB (ref 0.61–1.24)
Calcium: 6.6 mg/dL — ABNORMAL LOW (ref 8.9–10.3)
Calcium: 5.9 mg/dL — CL (ref 8.9–10.3)
Chloride: 108 mmol/L (ref 98–111)
Chloride: 107 mmol/L (ref 98–111)
Creatinine, Ser: 3 mg/dL — ABNORMAL HIGH (ref 0.61–1.24)
GFR calc Af Amer: 18 mL/min — ABNORMAL LOW (ref >60)
GFR calc Af Amer: 22 mL/min — ABNORMAL LOW (ref >60)
GFR calc Af Amer: 27 mL/min — ABNORMAL LOW (ref >60)
GFR calc non Af Amer: 15 mL/min — ABNORMAL LOW (ref >60)
GFR calc non Af Amer: 19 mL/min — ABNORMAL LOW (ref >60)
GFR calc non Af Amer: 23 mL/min — ABNORMAL LOW (ref >60)
GLUCOSE: 158 mg/dL — AB (ref 70–99)
Glucose, Bld: 81 mg/dL (ref 70–99)
Glucose, Bld: 168 mg/dL — ABNORMAL HIGH (ref 70–99)
Phosphorus: 2.5 mg/dL (ref 2.5–4.6)
Phosphorus: 2.9 mg/dL (ref 2.5–4.6)
Phosphorus: 2.9 mg/dL (ref 2.5–4.6)
Potassium: 4.1 mmol/L (ref 3.5–5.1)
Potassium: 3.9 mmol/L (ref 3.5–5.1)
Potassium: 3.6 mmol/L (ref 3.5–5.1)
SODIUM: 139 mmol/L (ref 135–145)
Sodium: 138 mmol/L (ref 135–145)
Sodium: 138 mmol/L (ref 135–145)

## 2018-03-22 LAB — CBC
HCT: 51.8 % (ref 39.0–52.0)
HCT: 49.7 % (ref 39.0–52.0)
HEMOGLOBIN: 16.8 g/dL (ref 13.0–17.0)
Hemoglobin: 17.5 g/dL — ABNORMAL HIGH (ref 13.0–17.0)
MCH: 33.3 pg (ref 26.0–34.0)
MCH: 33.5 pg (ref 26.0–34.0)
MCHC: 33.8 g/dL (ref 30.0–36.0)
MCHC: 33.8 g/dL (ref 30.0–36.0)
MCV: 98.5 fL (ref 80.0–100.0)
MCV: 99.2 fL (ref 80.0–100.0)
Platelets: 57 10*3/uL — ABNORMAL LOW (ref 150–400)
Platelets: 71 10*3/uL — ABNORMAL LOW (ref 150–400)
RBC: 5.01 MIL/uL (ref 4.22–5.81)
RBC: 5.26 MIL/uL (ref 4.22–5.81)
RDW: 12.6 % (ref 11.5–15.5)
RDW: 12.7 % (ref 11.5–15.5)
WBC: 10.4 10*3/uL (ref 4.0–10.5)
WBC: 14.8 10*3/uL — ABNORMAL HIGH (ref 4.0–10.5)
nRBC: 0 % (ref 0.0–0.2)
nRBC: 0 % (ref 0.0–0.2)

## 2018-03-22 LAB — TRIGLYCERIDES
Triglycerides: 680 mg/dL — ABNORMAL HIGH (ref <150)
Triglycerides: 543 mg/dL — ABNORMAL HIGH (ref <150)
Triglycerides: 483 mg/dL — ABNORMAL HIGH (ref <150)
Triglycerides: 476 mg/dL — ABNORMAL HIGH (ref <150)

## 2018-03-22 LAB — HIV ANTIBODY (ROUTINE TESTING W REFLEX): HIV Screen 4th Generation wRfx: NONREACTIVE

## 2018-03-22 LAB — PROCALCITONIN: Procalcitonin: 24.31 ng/mL

## 2018-03-22 LAB — LIPASE, BLOOD: Lipase: 606 U/L — ABNORMAL HIGH (ref 11–51)

## 2018-03-22 LAB — LACTIC ACID, PLASMA
LACTIC ACID, VENOUS: 1.9 mmol/L (ref 0.5–1.9)
Lactic Acid, Venous: 2 mmol/L (ref 0.5–1.9)

## 2018-03-22 MED ORDER — METRONIDAZOLE IN NACL 5-0.79 MG/ML-% IV SOLN
500.0000 mg | Freq: Three times a day (TID) | INTRAVENOUS | Status: DC
  Administered 2018-03-22 (×3): 500 mg via INTRAVENOUS
  Filled 2018-03-22 (×5): qty 100

## 2018-03-22 MED ORDER — HYDROCORTISONE NA SUCCINATE PF 100 MG IJ SOLR
50.0000 mg | Freq: Four times a day (QID) | INTRAMUSCULAR | Status: DC
  Administered 2018-03-22 (×4): 50 mg via INTRAVENOUS
  Filled 2018-03-22 (×4): qty 2

## 2018-03-22 MED ORDER — ENOXAPARIN SODIUM 30 MG/0.3ML ~~LOC~~ SOLN: 30.0000 mg | SUBCUTANEOUS | Status: DC

## 2018-03-22 MED ORDER — PROPOFOL 1000 MG/100ML IV EMUL
5.0000 ug/kg/min | INTRAVENOUS | Status: DC
  Administered 2018-03-22: 10 ug/kg/min via INTRAVENOUS
  Filled 2018-03-22: qty 100

## 2018-03-22 MED ORDER — THIAMINE HCL 100 MG/ML IJ SOLN: 100.0000 mg | Freq: Every day | INTRAMUSCULAR | Status: DC

## 2018-03-22 MED ORDER — SODIUM CHLORIDE 0.9 % IV SOLN
INTRAVENOUS | Status: DC | PRN
  Administered 2018-03-22: 250 mL via INTRAVENOUS

## 2018-03-22 MED ORDER — CALCIUM GLUCONATE-NACL 1-0.675 GM/50ML-% IV SOLN
1.0000 g | Freq: Once | INTRAVENOUS | Status: AC
  Administered 2018-03-22: 1000 mg via INTRAVENOUS
  Filled 2018-03-22: qty 50

## 2018-03-22 MED ORDER — DEXTROSE 50 % IV SOLN
12.5000 g | INTRAVENOUS | Status: AC
  Administered 2018-03-22: 12.5 g via INTRAVENOUS

## 2018-03-22 MED ORDER — INSULIN (MYXREDLIN) INFUSION FOR HYPERTRIGLYCERIDEMIA
2.0000 [IU]/h | INTRAVENOUS | Status: DC
  Administered 2018-03-22 (×2): 2 [IU]/h via INTRAVENOUS
  Filled 2018-03-22 (×2): qty 100

## 2018-03-22 MED ORDER — DEXTROSE 10 % IV SOLN: 50.0000 mL/h | INTRAVENOUS | Status: DC

## 2018-03-22 MED ORDER — PUREFLOW DIALYSIS SOLUTION
INTRAVENOUS | Status: DC
  Administered 2018-03-22: 11:00:00 via INTRAVENOUS_CENTRAL

## 2018-03-22 MED ORDER — IPRATROPIUM-ALBUTEROL 0.5-2.5 (3) MG/3ML IN SOLN
3.0000 mL | Freq: Four times a day (QID) | RESPIRATORY_TRACT | Status: DC | PRN
  Administered 2018-03-22: 3 mL via RESPIRATORY_TRACT
  Filled 2018-03-22: qty 3

## 2018-03-22 MED ORDER — MIDAZOLAM HCL 2 MG/2ML IJ SOLN: 2.0000 mg | INTRAMUSCULAR | 0 refills | Status: DC | PRN

## 2018-03-22 MED ORDER — SODIUM CHLORIDE 0.9 % IV SOLN
1.0000 g | Freq: Three times a day (TID) | INTRAVENOUS | Status: DC
  Administered 2018-03-22 (×2): 1 g via INTRAVENOUS
  Filled 2018-03-22 (×4): qty 1

## 2018-03-22 MED ORDER — THIAMINE HCL 100 MG PO TABS: 100.0000 mg | ORAL_TABLET | Freq: Every day | ORAL | Status: DC

## 2018-03-22 MED ORDER — SODIUM CHLORIDE 0.9 % IV SOLN
0.0000 ug/min | INTRAVENOUS | Status: DC
  Administered 2018-03-22: 150 ug/min via INTRAVENOUS
  Administered 2018-03-22: 130 ug/min via INTRAVENOUS
  Administered 2018-03-22: 100 ug/min via INTRAVENOUS
  Administered 2018-03-22: 50 ug/min via INTRAVENOUS
  Filled 2018-03-22 (×2): qty 40
  Filled 2018-03-22: qty 4
  Filled 2018-03-22: qty 40

## 2018-03-22 MED ORDER — PANTOPRAZOLE SODIUM 40 MG IV SOLR: 40.0000 mg | Freq: Two times a day (BID) | INTRAVENOUS | Status: DC

## 2018-03-22 MED ORDER — PIPERACILLIN-TAZOBACTAM 3.375 G IVPB
3.3750 g | Freq: Three times a day (TID) | INTRAVENOUS | Status: DC
  Administered 2018-03-22: 3.375 g via INTRAVENOUS
  Filled 2018-03-22: qty 50

## 2018-03-22 MED ORDER — SODIUM CHLORIDE 0.9 % IV SOLN: 2.0000 g | INTRAVENOUS | Status: DC

## 2018-03-22 MED ORDER — VASOPRESSIN 20 UNIT/ML IV SOLN: 0.0300 [IU]/min | INTRAVENOUS | Status: DC

## 2018-03-22 MED ORDER — SODIUM CHLORIDE 0.9 % IV SOLN: 1.0000 g | Freq: Three times a day (TID) | INTRAVENOUS | Status: DC

## 2018-03-22 MED ORDER — ALTEPLASE 2 MG IJ SOLR
4.0000 mg | Freq: Once | INTRAMUSCULAR | Status: AC
  Administered 2018-03-22: 4 mg

## 2018-03-22 MED ORDER — MAGNESIUM SULFATE 2 GM/50ML IV SOLN
2.0000 g | Freq: Once | INTRAVENOUS | Status: AC
  Administered 2018-03-22: 2 g via INTRAVENOUS
  Filled 2018-03-22: qty 50

## 2018-03-22 MED ORDER — HEPARIN SODIUM (PORCINE) 5000 UNIT/ML IJ SOLN: 5000.0000 [IU] | Freq: Three times a day (TID) | INTRAMUSCULAR | Status: DC

## 2018-03-22 MED ORDER — METOPROLOL TARTRATE 5 MG/5ML IV SOLN: 2.5000 mg | Freq: Four times a day (QID) | INTRAVENOUS | Status: DC | PRN

## 2018-03-22 MED ORDER — VECURONIUM BROMIDE 10 MG IV SOLR
INTRAVENOUS | Status: AC
  Administered 2018-03-22: 10 mg via INTRAVENOUS
  Filled 2018-03-22: qty 10

## 2018-03-22 MED ORDER — ONDANSETRON HCL 4 MG PO TABS: 4.0000 mg | ORAL_TABLET | Freq: Four times a day (QID) | ORAL | 0 refills | Status: DC | PRN

## 2018-03-22 MED ORDER — SODIUM CHLORIDE 0.9 % IV SOLN
0.5000 mg/h | INTRAVENOUS | Status: DC
  Administered 2018-03-22: 0.5 mg/h via INTRAVENOUS
  Administered 2018-03-23: 5 mg/h via INTRAVENOUS
  Filled 2018-03-22 (×2): qty 10

## 2018-03-22 MED ORDER — DEXTROSE 5 % IV SOLN
INTRAVENOUS | Status: DC
  Administered 2018-03-22: 08:00:00 via INTRAVENOUS

## 2018-03-22 MED ORDER — HEPARIN SODIUM (PORCINE) 1000 UNIT/ML DIALYSIS
1000.0000 [IU] | INTRAMUSCULAR | Status: DC | PRN
  Filled 2018-03-22: qty 6

## 2018-03-22 MED ORDER — NICOTINE 21 MG/24HR TD PT24: 21.0000 mg | MEDICATED_PATCH | Freq: Every day | TRANSDERMAL | 0 refills | Status: DC

## 2018-03-22 MED ORDER — VASOPRESSIN 20 UNIT/ML IV SOLN
0.0300 [IU]/min | INTRAVENOUS | Status: DC
  Administered 2018-03-22: 0.03 [IU]/min via INTRAVENOUS
  Filled 2018-03-22 (×2): qty 2

## 2018-03-22 MED ORDER — DEXTROSE 50 % IV SOLN
INTRAVENOUS | Status: AC
  Administered 2018-03-22: 12.5 g via INTRAVENOUS
  Filled 2018-03-22: qty 50

## 2018-03-22 MED ORDER — FENTANYL BOLUS VIA INFUSION
100.0000 ug | Freq: Once | INTRAVENOUS | Status: AC
  Administered 2018-03-22: 100 ug via INTRAVENOUS

## 2018-03-22 MED ORDER — SODIUM CHLORIDE 0.9 % IV SOLN: 0.0000 ug/min | INTRAVENOUS | Status: DC

## 2018-03-22 MED ORDER — MIDAZOLAM HCL 2 MG/2ML IJ SOLN
2.0000 mg | INTRAMUSCULAR | Status: DC | PRN
  Administered 2018-03-22 (×2): 2 mg via INTRAVENOUS
  Filled 2018-03-22: qty 2

## 2018-03-22 MED ORDER — DEXTROSE 10 % IV SOLN
INTRAVENOUS | Status: DC
  Administered 2018-03-22: 12:00:00 via INTRAVENOUS

## 2018-03-22 MED ORDER — SODIUM CHLORIDE 0.9 % IV SOLN
2.0000 g | Freq: Two times a day (BID) | INTRAVENOUS | Status: DC
  Administered 2018-03-22: 2 g via INTRAVENOUS
  Filled 2018-03-22 (×3): qty 2

## 2018-03-22 MED ORDER — IPRATROPIUM-ALBUTEROL 0.5-2.5 (3) MG/3ML IN SOLN: 3.0000 mL | Freq: Four times a day (QID) | RESPIRATORY_TRACT | Status: DC | PRN

## 2018-03-22 MED ORDER — METOPROLOL TARTRATE 5 MG/5ML IV SOLN: 5.0000 mg | Freq: Three times a day (TID) | INTRAVENOUS | Status: DC

## 2018-03-22 MED ORDER — MIDAZOLAM HCL 2 MG/2ML IJ SOLN
2.0000 mg | INTRAMUSCULAR | Status: DC | PRN
  Administered 2018-03-22 (×2): 2 mg via INTRAVENOUS
  Filled 2018-03-22 (×2): qty 2

## 2018-03-22 MED ORDER — VECURONIUM BROMIDE 10 MG IV SOLR
10.0000 mg | Freq: Once | INTRAVENOUS | Status: AC
  Administered 2018-03-22: 10 mg via INTRAVENOUS

## 2018-03-22 MED ORDER — SODIUM CHLORIDE 0.9 % IV SOLN: 0.5000 mg/h | INTRAVENOUS | Status: DC

## 2018-03-22 MED ORDER — METRONIDAZOLE IN NACL 5-0.79 MG/ML-% IV SOLN: 500.0000 mg | Freq: Three times a day (TID) | INTRAVENOUS | Status: DC

## 2018-03-22 MED ORDER — DEXTROSE 50 % IV SOLN
1.0000 | Freq: Once | INTRAVENOUS | Status: AC
  Administered 2018-03-22: 50 mL via INTRAVENOUS
  Filled 2018-03-22: qty 50

## 2018-03-22 MED ORDER — HEPARIN SODIUM (PORCINE) 1000 UNIT/ML DIALYSIS
1000.0000 [IU] | INTRAMUSCULAR | Status: DC | PRN
  Administered 2018-03-22: 1400 [IU] via INTRAVENOUS_CENTRAL
  Filled 2018-03-22 (×2): qty 6

## 2018-03-22 MED ORDER — CHLORHEXIDINE GLUCONATE 0.12% ORAL RINSE (MEDLINE KIT): 15.0000 mL | Freq: Two times a day (BID) | OROMUCOSAL | 0 refills | Status: DC

## 2018-03-22 MED ORDER — PHENYLEPHRINE HCL-NACL 10-0.9 MG/250ML-% IV SOLN
0.0000 ug/min | INTRAVENOUS | Status: DC
  Administered 2018-03-22: 20 ug/min via INTRAVENOUS
  Filled 2018-03-22 (×2): qty 250

## 2018-03-22 MED ORDER — ONDANSETRON HCL 4 MG/2ML IJ SOLN: 4.0000 mg | Freq: Four times a day (QID) | INTRAMUSCULAR | 0 refills | Status: DC | PRN

## 2018-03-22 MED ORDER — HYDROCORTISONE NA SUCCINATE PF 100 MG IJ SOLR: 50.0000 mg | Freq: Four times a day (QID) | INTRAMUSCULAR | Status: DC

## 2018-03-22 MED ORDER — PANTOPRAZOLE SODIUM 40 MG IV SOLR
40.0000 mg | Freq: Two times a day (BID) | INTRAVENOUS | Status: DC
  Administered 2018-03-22: 40 mg via INTRAVENOUS
  Filled 2018-03-22: qty 40

## 2018-03-22 MED ORDER — SODIUM CHLORIDE 0.9 % IV SOLN
0.5000 mg/h | INTRAVENOUS | Status: DC
  Filled 2018-03-22: qty 10

## 2018-03-22 NOTE — Progress Notes (Signed)
Follow up - Critical Care Medicine Note  Patient Details:    Randy Deleon is an 53 y.o. male.  With a past medical history remarkable for hypertension, type 2 diabetes, DKA, presented with nausea, vomiting, found to be in DKA with an anion gap of 20, CT scan of the abdomen showed acute pancreatitis as patient does have a regular EtOH history.  Was admitted to the intensive care unit for pain control, insulin infusion, placed on CIWA, risk of DT's  Lines, Airways, Drains: Airway 8 mm (Active)  Secured at (cm) 26 cm 03/22/2018  8:00 AM  Measured From Lips 03/22/2018  8:00 AM  Secured Location Center 03/22/2018  8:00 AM  Secured By [employer] 03/22/2018  8:00 AM  Tube Holder Repositioned Yes 03/22/2018  3:00 AM  Cuff Pressure (cm H2O) 28 cm H2O 03/21/2018 11:35 PM  Site Condition Dry 03/22/2018  8:00 AM     NG/OG Tube Orogastric 18 Fr. Center mouth Xray Documented cm marking at nare/ corner of mouth 70 cm (Active)  Cm Marking at Nare/Corner of Mouth (if applicable) 70 cm 03/22/2018  8:00 AM  Site Assessment Clean;Dry;Intact 03/22/2018  8:00 AM  Ongoing Placement Verification No change in cm markings or external length of tube from initial placement 03/22/2018  8:00 AM  Status Suction-low intermittent 03/22/2018  8:00 AM  Amount of suction 80 mmHg 03/22/2018  8:00 AM  Drainage Appearance Brown;Bile;Green;Thick 03/22/2018  8:00 AM  Output (mL) 300 mL 03/22/2018  8:30 AM     Urethral Catheter June Leap, RN Latex;Straight-tip 14 Fr. (Active)  Indication for Insertion or Continuance of Catheter Unstable critical patients (first 24-48 hours) 03/22/2018  8:00 AM  Site Assessment Clean;Intact 03/22/2018  8:00 AM  Catheter Maintenance Bag below level of bladder;Catheter secured;Drainage bag/tubing not touching floor;Insertion date on drainage bag;No dependent loops;Seal intact;Bag emptied prior to transport 03/22/2018  8:00 AM  Collection Container Standard drainage bag 03/22/2018  8:00 AM  Securement Method Securing  device (Describe) 03/22/2018  8:00 AM  Urinary Catheter Interventions Unclamped 03/21/2018  8:00 PM  Output (mL) 25 mL 03/22/2018  8:30 AM    Anti-infectives:  Anti-infectives (From admission, onward)   Start     Dose/Rate Route Frequency Ordered Stop   03/23/18 0600  ceFEPIme (MAXIPIME) 2 g in sodium chloride 0.9 % 100 mL IVPB     2 g 200 mL/hr over 30 Minutes Intravenous Every 24 hours 03/22/18 0848     03/22/18 0345  ceFEPIme (MAXIPIME) 2 g in sodium chloride 0.9 % 100 mL IVPB  Status:  Discontinued     2 g 200 mL/hr over 30 Minutes Intravenous Every 12 hours 03/22/18 0334 03/22/18 0848   03/22/18 0330  metroNIDAZOLE (FLAGYL) IVPB 500 mg     500 mg 100 mL/hr over 60 Minutes Intravenous Every 8 hours 03/22/18 0326     03/22/18 0045  piperacillin-tazobactam (ZOSYN) IVPB 3.375 g  Status:  Discontinued     3.375 g 12.5 mL/hr over 240 Minutes Intravenous Every 8 hours 03/22/18 0040 03/22/18 0326      Microbiology: Results for orders placed or performed during the hospital encounter of 03/20/18  MRSA PCR Screening     Status: None   Collection Time: 03/20/18 11:00 PM  Result Value Ref Range Status   MRSA by PCR NEGATIVE NEGATIVE Final    Comment:        The GeneXpert MRSA Assay (FDA approved for NASAL specimens only), is one component of a comprehensive MRSA  colonization surveillance program. It is not intended to diagnose MRSA infection nor to guide or monitor treatment for MRSA infections. Performed at [facility], [address] [place], [place] [address]     Studies: Ct Abdomen Pelvis Wo Contrast  Result Date: 03/22/2018 CLINICAL DATA:  Acute onset of increased work of breathing. Severe generalized abdominal pain and distention. Recently diagnosed with acute pancreatitis. EXAM: CT ABDOMEN AND PELVIS WITHOUT CONTRAST TECHNIQUE: Multidetector CT imaging of the abdomen and pelvis was performed following the standard protocol without IV contrast. COMPARISON:  CT of the  abdomen and pelvis performed 03/20/2018 FINDINGS: Lower chest: Small bilateral pleural effusions are noted. Partial consolidation of both lower lobes likely reflects atelectasis. Diffuse coronary artery calcifications are seen. Hepatobiliary: The liver is unremarkable in appearance. Residual contrast is noted within the gallbladder. The common bile duct is normal in caliber. The common bile duct remains normal in caliber. Pancreas: Diffuse soft tissue inflammation is again noted about the pancreas, reflecting known pancreatitis. There is increased fluid tracking about the greater curvature of the stomach, raising concern for evolving pseudocyst. Diffuse mesenteric edema is seen tracking inferiorly, with fluid tracking along the paracolic gutters bilaterally into the pelvis. Evaluation for devascularization is markedly limited without contrast. Spleen: The spleen is unremarkable in appearance. Adrenals/Urinary Tract: The adrenal glands are unremarkable in appearance. The kidneys are within normal limits. There is no evidence of hydronephrosis. No renal or ureteral stones are identified; increased attenuation within the renal calyces reflects minimal residual contrast. Mild bilateral perinephric stranding is noted. Stomach/Bowel: The stomach is unremarkable in appearance. The small bowel is within normal limits. The appendix is not visualized; there is no evidence for appendicitis. The colon is unremarkable in appearance. [name] enteric tube is noted ending at the first segment of the duodenum. Vascular/Lymphatic: Scattered calcification is seen along the abdominal aorta and its branches. The abdominal aorta is otherwise grossly unremarkable. The inferior vena cava is grossly unremarkable. No retroperitoneal lymphadenopathy is seen. No pelvic sidewall lymphadenopathy is identified. Reproductive: The bladder is decompressed, with a Foley catheter in place. The prostate is borderline normal in size. Other: No  additional soft tissue abnormalities are seen. A left femoral line is noted. Musculoskeletal: No acute osseous abnormalities are identified. The visualized musculature is unremarkable in appearance. IMPRESSION: 1. Pancreatitis again noted. Increased fluid tracking about the greater curvature of the stomach, raising concern for evolving pseudocyst. Diffuse mesenteric edema tracks inferiorly, with fluid tracking along the paracolic gutters bilaterally into the pelvis. The amount of fluid has increased since the prior study, concerning for worsening pancreatitis. Evaluation for devascularization is markedly limited without contrast. 2. Small bilateral pleural effusions. Partial consolidation of both lower lung lobes likely reflects atelectasis. 3. Diffuse coronary artery calcifications seen. Aortic Atherosclerosis (ICD10-I70.0). Electronically Signed   By: Roanna RaiderJeffery  Chang M.D.   On: 03/22/2018 03:12   Ct Abdomen Pelvis W Contrast  Result Date: 03/20/2018 CLINICAL DATA:  Hyperglycemia, nausea vomiting EXAM: CT ABDOMEN AND PELVIS WITH CONTRAST TECHNIQUE: Multidetector CT imaging of the abdomen and pelvis was performed using the standard protocol following bolus administration of intravenous contrast. CONTRAST:  100mL ISOVUE-300 IOPAMIDOL (ISOVUE-300) INJECTION 61% COMPARISON:  08/15/2006 UGI FINDINGS: Lower chest: Lung bases demonstrate no consolidation or pleural effusion. Borderline cardiomegaly. Coronary calcifications. Small hiatal hernia Hepatobiliary: Steatosis. No calcified gallstone or biliary dilatation Pancreas: Severe peripancreatic inflammatory changes consistent with pancreatitis. Moderate fluid within the mesentery and pararenal spaces. No ductal dilatation. Spleen: Normal in size without focal abnormality. Adrenals/Urinary  Tract: Adrenal glands are normal. No hydronephrosis. Subcentimeter hypodensity mid left kidney too small to further characterize. Bladder normal Stomach/Bowel: Stomach is within normal  limits. Appendix appears normal. Wall thickening of the anterior wall of the second and third portions of the duodenum. Vascular/Lymphatic: Mild aortic atherosclerosis without aneurysm. Normal portal vein, mesenteric vein, and splenic vein enhancement. Mildly prominent peripancreatic lymph nodes best seen on coronal views. Reproductive: Prostate is unremarkable. Other: Negative for free air. Small free fluid within the upper abdomen. Oval calcification within the central mesentery, possible calcified node. Musculoskeletal: No acute or suspicious abnormality IMPRESSION: 1. Marked edema and fluid surrounding the pancreas, consistent with acute pancreatitis. No organized fluid collections or evidence for necrosis at this time. 2. Anterior wall thickening of the second and third portion of duodenum, likely reactive/due to inflammatory changes in the pancreas. 3. Hepatic steatosis Electronically Signed   By: Jasmine Pang M.D.   On: 03/20/2018 19:18   Dg Chest Port 1 View  Result Date: 03/22/2018 CLINICAL DATA:  Respiratory failure. EXAM: PORTABLE CHEST 1 VIEW COMPARISON:  03/21/2018. FINDINGS: Endotracheal tube in stable position. NG tube below left hemidiaphragm. Cardiomegaly. Bilateral pulmonary infiltrates and bilateral pleural effusions progressed from prior exam. CHF and bibasilar pneumonia could present this fashion. No pneumothorax. IMPRESSION: 1. Endotracheal tube stable position. NG tube below left hemidiaphragm. 2. Cardiomegaly with bibasilar infiltrates and bilateral pleural effusions, progressed from prior exam. CHF could present this fashion. Bibasilar pneumonia can not be excluded. Electronically Signed   By: Maisie Fus  Register   On: 03/22/2018 06:27   Dg Chest Port 1 View  Result Date: 03/22/2018 CLINICAL DATA:  Central line placement EXAM: PORTABLE CHEST 1 VIEW COMPARISON:  03/22/2018 at 0324 hours FINDINGS: Endotracheal tube terminates 2.8 cm above the carina. Enteric tube courses into the stomach.  Moderate right and small left pleural effusions. Associated left lower lobe opacity, likely atelectasis. Mild frank interstitial edema. No pneumothorax. Cardiomegaly. IMPRESSION: Endotracheal tube terminates 2.8 cm above the carina. Enteric tube courses into the stomach. Cardiomegaly with mild interstitial edema. Moderate right and small left pleural effusions. Electronically Signed   By: Charline Bills M.D.   On: 03/22/2018 07:10   Dg Chest Port 1 View  Result Date: 03/21/2018 CLINICAL DATA:  Status post intubation EXAM: PORTABLE CHEST 1 VIEW COMPARISON:  None. FINDINGS: Endotracheal tube tip is about 3.3 cm superior to carina. Esophageal tube tip is below diaphragm but non included in the field of view. Small bilateral pleural effusions and bibasilar dense airspace disease. Mild cardiomegaly. No pneumothorax. IMPRESSION: 1. Endotracheal tube tip about 3.3 cm superior to carina 2. Interval development of small bilateral pleural effusions and dense bibasilar atelectasis or pneumonia Electronically Signed   By: Jasmine Pang M.D.   On: 03/21/2018 17:44   Dg Abd Portable 1v  Result Date: 03/21/2018 CLINICAL DATA:  NG tube EXAM: PORTABLE ABDOMEN - 1 VIEW COMPARISON:  CT 03/20/2018 FINDINGS: Limited by habitus. Esophageal tube tip overlies the distal stomach. Consolidation at both bases. Nonobstructed gas pattern. IMPRESSION: Esophageal tube tip overlies the distal stomach Electronically Signed   By: Jasmine Pang M.D.   On: 03/21/2018 17:45   [place] Ekg Site Rite  Result Date: 03/21/2018 If Site Rite image not attached, placement could not be confirmed due to current cardiac rhythm.   Consults: Treatment Team:  Pccm, Raymond Gurney, MD Lamont Dowdy, MD   Subjective:    Overnight Issues: Patient status deteriorated throughout the day yesterday with worsening encephalopathy, respiratory failure, required intubation.  Became hypotensive required central line placement and pressors, worsening renal  function, nephrology has been consulted and vascular catheter has been placed for possible CRRT  Objective:  Vital signs for last 24 hours: Temp:  [98.8 F (37.1 C)-101.2 F (38.4 C)] 100 F (37.8 C) (01/03 0900) Pulse Rate:  [100-146] 103 (01/03 0900) Resp:  [21-47] 22 (01/03 0900) BP: (77-132)/(57-90) 98/77 (01/03 0900) SpO2:  [92 %-98 %] 93 % (01/03 0900) FiO2 (%):  [100 %] 100 % (01/03 0800)  Hemodynamic parameters for last 24 hours:    Intake/Output from previous day: 01/02 0701 - 01/03 0700 In: 4629.5 [I.V.:2932.1; IV Piggyback:1697.4] Out: 805 [Urine:205; Emesis/NG output:600]  Intake/Output this shift: Total I/O In: 513.2 [I.V.:513.2] Out: 325 [Urine:25; Emesis/NG output:300]  Vent settings for last 24 hours: Vent Mode: PRVC FiO2 (%):  [100 %] 100 % Set Rate:  [20 bmp-24 bmp] 24 bmp Vt Set:  [500 mL] 500 mL PEEP:  [5 cmH20] 5 cmH20 Plateau Pressure:  [22 cmH20-23 cmH20] 22 cmH20  Physical Exam:  Vital signs: Please see the above listed vital signs HEENT: Patient is orally intubated, orogastric tube in place, trachea midline Cardiovascular: Tachycardia, Pulmonary: Coarse rhonchi noted diffusely throughout Abdominal: Hypoactive bowel sounds Extremities: No clubbing, cyanosis, edema is appreciated Neurologic: Patient is unresponsive, sedated  Assessment/Plan:  Respiratory failure.  Patient intubated, on mechanical ventilation protocol.  Chest x-ray showing both volume overload and airspace disease, concerning for ARDS  Septic shock on pressors.  Vasopressin, norepinephrine, Neo-Synephrine.  Will cover with stress dose steroids  Acute pancreatitis.  Repeat CT scan of abdomen noted  Renal failure.  Worsening renal function, markedly diminished urine output, progressive volume overload.  Will need CRRT pending nephrology  Hypocalcemia.  Replace  Leukocytosis.  Patient on cefepime and Flagyl  Hypertriglyceridemia.  Patient is on insulin infusion  Critical  care time 45 minutes  Marquay Kruse 03/22/2018  *Care during the described time interval was provided by me and/or other providers on the critical care team.  I have reviewed this patient's available data, including medical history, events of note, physical examination and test results as part of my evaluation.

## 2018-03-22 NOTE — Consult Note (Signed)
[facility]  CONSULT NOTE    Date: 03/22/2018                  Patient Name:  Randy Deleon  MRN: [id number]  DOB: Sep 30, 1965  Age / Sex: 53 y.o., male         PCP: [name], MD                 Service Requesting Consult: Dr. Jefferson Fuel                 Reason for Consult: Acute renal failure            History of Present Illness: Randy Deleon is a 53 y.o.  male who was admitted to [facility] on 03/20/2018 for Acute pancreatitis, unspecified complication status, unspecified pancreatitis type [K85.90] DKA (diabetic ketoacidoses) ([place]) [E11.10]  Patient intubated yesterday. Decompensated renal failure. Oliguric and consulted for renal failure.   Tmax 101.2.   No family at bedside.   Medications: Outpatient medications: Medications Prior to Admission  Medication Sig Dispense Refill Last Dose  . carvedilol (COREG) 25 MG tablet Take 25 mg by mouth 2 (two) times daily.  2 03/20/2018 at 0800  . CVS D3 5000 units capsule Take 5,000 Units by mouth daily.   2 03/20/2018 at 0800  . KOMBIGLYZE XR 07-998 MG TB24 Take 1 tablet by mouth 2 (two) times daily. 30 tablet 2 03/20/2018 at 0800  . Omega-3 Fatty Acids (FISH OIL ODOR-LESS) 1200 MG CAPS Take 1,200 mg by mouth daily.   03/20/2018 at 0800  . rosuvastatin (CRESTOR) 10 MG tablet Take 10 mg daily by mouth.  2 03/19/2018 at 2000  . sildenafil (VIAGRA) 100 MG tablet Take 100 mg by mouth as needed for erectile dysfunction.   2 prn at prn    Current medications: Current Facility-Administered Medications  Medication Dose Route Frequency Provider Last Rate Last Dose  . acetaminophen (TYLENOL) tablet 650 mg  650 mg Oral Q6H PRN Saundra Shelling, MD       Or  . acetaminophen (TYLENOL) suppository 650 mg  650 mg Rectal Q6H PRN Saundra Shelling, MD   650 mg at 03/21/18 2131  . [START ON 03/23/2018] ceFEPIme (MAXIPIME) 2 g in sodium chloride 0.9 % 100 mL IVPB  2 g Intravenous Q24H Hallaji, Sheema M, RPH      . chlorhexidine  gluconate (MEDLINE KIT) (PERIDEX) 0.12 % solution 15 mL  15 mL Mouth Rinse BID Flora Lipps, MD   15 mL at 03/22/18 0810  . dextrose 5 % solution   Intravenous Continuous Conforti, John, DO 50 mL/hr at 03/22/18 0830    . enoxaparin (LOVENOX) injection 30 mg  30 mg Subcutaneous Q24H Gouru, Aruna, MD      . fenofibrate tablet 160 mg  160 mg Oral Daily Awilda Bill, NP   160 mg at 03/21/18 0902  . fentaNYL 2551mg in NS 2548m(1013mml) infusion-PREMIX  0-400 mcg/hr Intravenous Continuous KasFlora LippsD 40 mL/hr at 03/22/18 0830 400 mcg/hr at 03/22/18 0830  . heparin injection 1,000-6,000 Units  1,000-6,000 Units CRRT PRN KeeDarel Hong NP      . hydrALAZINE (APRESOLINE) injection 10-20 mg  10-20 mg Intravenous Q4H PRN BlaAwilda BillP   10 mg at 03/21/18 0151  . hydrocortisone sodium succinate (SOLU-CORTEF) 100 MG injection 50 mg  50 mg Intravenous Q6H KeeDarel Hong NP   50 mg at 03/22/18 0445  . HYDROmorphone (DILAUDID)  injection 1 mg  1 mg Intravenous Q2H PRN Awilda Bill, NP   1 mg at 03/21/18 1346  . insulin regular, human (MYXREDLIN) 100 units/ 100 mL infusion     5 Units/hr Intravenous Continuous Flora Lipps, MD 5 mL/hr at 03/22/18 0830 5 Units/hr at 03/22/18 0830  . MEDLINE mouth rinse  15 mL Mouth Rinse 10 times per day Flora Lipps, MD   15 mL at 03/22/18 0606  . metoprolol tartrate (LOPRESSOR) injection 2.5-5 mg  2.5-5 mg Intravenous Q6H PRN Awilda Bill, NP   5 mg at 03/21/18 1431  . metoprolol tartrate (LOPRESSOR) injection 5 mg  5 mg Intravenous Q8H Saundra Shelling, MD   Stopped at 03/22/18 0001  . metroNIDAZOLE (FLAGYL) IVPB 500 mg  500 mg Intravenous Q8H Bradly Bienenstock, NP   Stopped at 03/22/18 [phone]  . midazolam (VERSED) injection 2 mg  2 mg Intravenous Q15 min PRN Darel Hong D, NP      . midazolam (VERSED) injection 2 mg  2 mg Intravenous Q2H PRN Darel Hong D, NP      . nicotine (NICODERM CQ - dosed in mg/24 hours) patch 21 mg  21 mg Transdermal  Daily Saundra Shelling, MD   21 mg at 03/21/18 2044  . ondansetron (ZOFRAN) tablet 4 mg  4 mg Oral Q6H PRN Saundra Shelling, MD       Or  . ondansetron (ZOFRAN) injection 4 mg  4 mg Intravenous Q6H PRN Saundra Shelling, MD   4 mg at 03/20/18 2329  . phenylephrine (NEO-SYNEPHRINE) 40 mg in sodium chloride 0.9 % 250 mL (0.16 mg/mL) infusion  0-400 mcg/min Intravenous Titrated Darel Hong D, NP 56.3 mL/hr at 03/22/18 0830 150 mcg/min at 03/22/18 0830  . rosuvastatin (CRESTOR) tablet 10 mg  10 mg Oral q1800 Awilda Bill, NP      . sodium polystyrene (KAYEXALATE) 15 GM/60ML suspension 15 g  15 g Per Tube Once Flora Lipps, MD   Stopped at 03/21/18 1715  . thiamine (VITAMIN B-1) tablet 100 mg  100 mg Oral Daily Pyreddy, Reatha Harps, MD   100 mg at 03/21/18 2025   Or  . thiamine (B-1) injection 100 mg  100 mg Intravenous Daily Pyreddy, Pavan, MD      . vasopressin (PITRESSIN) 40 Units in sodium chloride 0.9 % 250 mL (0.16 Units/mL) infusion  0.03 Units/min Intravenous Continuous Darel Hong D, NP 11.25 mL/hr at 03/22/18 0830 0.03 Units/min at 03/22/18 0830      Allergies: Allergies  Allergen Reactions  . Other     Lima beans  Per testing.  . Shellfish Allergy      Cannot recall reaction,Per your allergy testing showed that a allergy to shellfish.       Past Medical History: Past Medical History:  Diagnosis Date  . Diabetes mellitus without complication ([place])   . Hypertension      Past Surgical History: Past Surgical History:  Procedure Laterality Date  . none       Family History: Family History  Problem Relation Age of Onset  . Diabetes Mother   . Hypertension Mother   . Congestive Heart Failure Father   . Diabetes Father   . Hypertension Father      Social History: Social History   Socioeconomic History  . Marital status: Single    Spouse name: Not on file  . Number of children: Not on file  . Years of education: Not on file  . Highest education level: Not  on file   Occupational History  . Not on file  Social Needs  . Financial resource strain: Not on file  . Food insecurity:    Worry: Not on file    Inability: Not on file  . Transportation needs:    Medical: Not on file    Non-medical: Not on file  Tobacco Use  . Smoking status: Current Every Day Smoker    Packs/day: 0.75    Years: 20.00    Pack years: 15.00    Types: Cigarettes    Start date: 01/29/1997  . Smokeless tobacco: Never Used  . Tobacco comment: Refer to [facility]  Substance and Sexual Activity  . Alcohol use: Not Currently  . Drug use: Not Currently  . Sexual activity: Not on file  Lifestyle  . Physical activity:    Days per week: Not on file    Minutes per session: Not on file  . Stress: Not on file  Relationships  . Social connections:    Talks on phone: Not on file    Gets together: Not on file    Attends religious service: Not on file    Active member of club or organization: Not on file    Attends meetings of clubs or organizations: Not on file    Relationship status: Not on file  . Intimate partner violence:    Fear of current or ex partner: Not on file    Emotionally abused: Not on file    Physically abused: Not on file    Forced sexual activity: Not on file  Other Topics Concern  . Not on file  Social History Narrative  . Not on file     Review of Systems: Review of Systems  Unable to perform ROS: Critical illness    Vital Signs: Blood pressure 95/71, pulse (!) 102, temperature 99.7 F (37.6 C), temperature source Esophageal, resp. rate (!) 24, height '5\' 9"'$  (1.753 m), weight 109.4 kg, SpO2 96 %.  Weight trends: Filed Weights   03/20/18 2332  Weight: 109.4 kg    Physical Exam: General: Critically ill  Head: ETT, OGT  Eyes: Anicteric, PERRL  Neck:  trachea midline  Lungs:  Diminished bilaterally, PRVC 100%  Heart: tachycardia  Abdomen:  +distended.    Extremities: no peripheral edema.  Neurologic: Sedated, intubated  Skin: No  lesions  Access: Right femoral temp HD 03/21/18     Lab results: Basic Metabolic Panel: Recent Labs  Lab 03/22/18 0025 03/22/18 0347 03/22/18 0821  NA 138 139 139  K 5.0 4.5 3.9  CL 108 107 110  CO2 21* 22 21*  GLUCOSE 88 80 72  BUN 33* 35* 36*  CREATININE 3.45* 3.86* 4.11*  CALCIUM 6.6* 6.8* 6.3*    Liver Function Tests: Recent Labs  Lab 03/20/18 1350 03/20/18 1620 03/21/18 0400  AST 53* 48* 57*  ALT 45* 44 34  ALKPHOS 75 69 50  BILITOT 0.7 1.2 1.4*  PROT 7.9 8.1 6.2*  ALBUMIN 4.9 4.6 3.4*   Recent Labs  Lab 03/20/18 1620 03/22/18 0347  LIPASE 763* 606*   No results for input(s): AMMONIA in the last 168 hours.  CBC: Recent Labs  Lab 03/20/18 1350 03/20/18 1620 03/20/18 2306 03/21/18 0400 03/22/18 0025 03/22/18 0347  WBC 15.2* 17.4* 9.8 9.6 10.4 14.8*  NEUTROABS 13.3*  --   --   --   --   --   HGB 19.4* 19.7* 18.0* 18.7* 17.5* 16.8  HCT 56.6* 58.9* 52.9* 55.5* 51.8  49.7  MCV 98.6 100.2* 99.1 99.8 98.5 99.2  PLT 112* 124* 84* 94* 57* 71*    Cardiac Enzymes: Recent Labs  Lab 03/20/18 1715  TROPONINI <0.03    BNP: Invalid input(s): POCBNP  CBG: Recent Labs  Lab 03/22/18 0142 03/22/18 0409 03/22/18 0544 03/22/18 0807 03/22/18 0852  GLUCAP 101* 93 97 76 70*    Microbiology: Results for orders placed or performed during the hospital encounter of 03/20/18  MRSA PCR Screening     Status: None   Collection Time: 03/20/18 11:00 PM  Result Value Ref Range Status   MRSA by PCR NEGATIVE NEGATIVE Final    Comment:        The GeneXpert MRSA Assay (FDA approved for NASAL specimens only), is one component of a comprehensive MRSA colonization surveillance program. It is not intended to diagnose MRSA infection nor to guide or monitor treatment for MRSA infections. Performed at [facility], [address] [place], [place] [address]     Coagulation Studies: No results for input(s): LABPROT, INR in the last 72  hours.  Urinalysis: Recent Labs    03/20/18 1715  COLORURINE YELLOW*  LABSPEC >1.046*  PHURINE 5.0  GLUCOSEU >=500*  HGBUR MODERATE*  BILIRUBINUR NEGATIVE  KETONESUR 20*  PROTEINUR 100*  NITRITE NEGATIVE  LEUKOCYTESUR NEGATIVE      Imaging: Ct Abdomen Pelvis Wo Contrast  Result Date: 03/22/2018 CLINICAL DATA:  Acute onset of increased work of breathing. Severe generalized abdominal pain and distention. Recently diagnosed with acute pancreatitis. EXAM: CT ABDOMEN AND PELVIS WITHOUT CONTRAST TECHNIQUE: Multidetector CT imaging of the abdomen and pelvis was performed following the standard protocol without IV contrast. COMPARISON:  CT of the abdomen and pelvis performed 03/20/2018 FINDINGS: Lower chest: Small bilateral pleural effusions are noted. Partial consolidation of both lower lobes likely reflects atelectasis. Diffuse coronary artery calcifications are seen. Hepatobiliary: The liver is unremarkable in appearance. Residual contrast is noted within the gallbladder. The common bile duct is normal in caliber. The common bile duct remains normal in caliber. Pancreas: Diffuse soft tissue inflammation is again noted about the pancreas, reflecting known pancreatitis. There is increased fluid tracking about the greater curvature of the stomach, raising concern for evolving pseudocyst. Diffuse mesenteric edema is seen tracking inferiorly, with fluid tracking along the paracolic gutters bilaterally into the pelvis. Evaluation for devascularization is markedly limited without contrast. Spleen: The spleen is unremarkable in appearance. Adrenals/Urinary Tract: The adrenal glands are unremarkable in appearance. The kidneys are within normal limits. There is no evidence of hydronephrosis. No renal or ureteral stones are identified; increased attenuation within the renal calyces reflects minimal residual contrast. Mild bilateral perinephric stranding is noted. Stomach/Bowel: The stomach is unremarkable in  appearance. The small bowel is within normal limits. The appendix is not visualized; there is no evidence for appendicitis. The colon is unremarkable in appearance. [name] enteric tube is noted ending at the first segment of the duodenum. Vascular/Lymphatic: Scattered calcification is seen along the abdominal aorta and its branches. The abdominal aorta is otherwise grossly unremarkable. The inferior vena cava is grossly unremarkable. No retroperitoneal lymphadenopathy is seen. No pelvic sidewall lymphadenopathy is identified. Reproductive: The bladder is decompressed, with a Foley catheter in place. The prostate is borderline normal in size. Other: No additional soft tissue abnormalities are seen. A left femoral line is noted. Musculoskeletal: No acute osseous abnormalities are identified. The visualized musculature is unremarkable in appearance. IMPRESSION: 1. Pancreatitis again noted. Increased fluid tracking about the greater curvature of  the stomach, raising concern for evolving pseudocyst. Diffuse mesenteric edema tracks inferiorly, with fluid tracking along the paracolic gutters bilaterally into the pelvis. The amount of fluid has increased since the prior study, concerning for worsening pancreatitis. Evaluation for devascularization is markedly limited without contrast. 2. Small bilateral pleural effusions. Partial consolidation of both lower lung lobes likely reflects atelectasis. 3. Diffuse coronary artery calcifications seen. Aortic Atherosclerosis (ICD10-I70.0). Electronically Signed   By: Garald Balding M.D.   On: 03/22/2018 03:12   Ct Abdomen Pelvis W Contrast  Result Date: 03/20/2018 CLINICAL DATA:  Hyperglycemia, nausea vomiting EXAM: CT ABDOMEN AND PELVIS WITH CONTRAST TECHNIQUE: Multidetector CT imaging of the abdomen and pelvis was performed using the standard protocol following bolus administration of intravenous contrast. CONTRAST:  127m ISOVUE-300 IOPAMIDOL (ISOVUE-300) INJECTION 61%  COMPARISON:  08/15/2006 UGI FINDINGS: Lower chest: Lung bases demonstrate no consolidation or pleural effusion. Borderline cardiomegaly. Coronary calcifications. Small hiatal hernia Hepatobiliary: Steatosis. No calcified gallstone or biliary dilatation Pancreas: Severe peripancreatic inflammatory changes consistent with pancreatitis. Moderate fluid within the mesentery and pararenal spaces. No ductal dilatation. Spleen: Normal in size without focal abnormality. Adrenals/Urinary Tract: Adrenal glands are normal. No hydronephrosis. Subcentimeter hypodensity mid left kidney too small to further characterize. Bladder normal Stomach/Bowel: Stomach is within normal limits. Appendix appears normal. Wall thickening of the anterior wall of the second and third portions of the duodenum. Vascular/Lymphatic: Mild aortic atherosclerosis without aneurysm. Normal portal vein, mesenteric vein, and splenic vein enhancement. Mildly prominent peripancreatic lymph nodes best seen on coronal views. Reproductive: Prostate is unremarkable. Other: Negative for free air. Small free fluid within the upper abdomen. Oval calcification within the central mesentery, possible calcified node. Musculoskeletal: No acute or suspicious abnormality IMPRESSION: 1. Marked edema and fluid surrounding the pancreas, consistent with acute pancreatitis. No organized fluid collections or evidence for necrosis at this time. 2. Anterior wall thickening of the second and third portion of duodenum, likely reactive/due to inflammatory changes in the pancreas. 3. Hepatic steatosis Electronically Signed   By: KDonavan FoilM.D.   On: 03/20/2018 19:18   Dg Chest Port 1 View  Result Date: 03/22/2018 CLINICAL DATA:  Respiratory failure. EXAM: PORTABLE CHEST 1 VIEW COMPARISON:  03/21/2018. FINDINGS: Endotracheal tube in stable position. NG tube below left hemidiaphragm. Cardiomegaly. Bilateral pulmonary infiltrates and bilateral pleural effusions progressed from prior  exam. CHF and bibasilar pneumonia could present this fashion. No pneumothorax. IMPRESSION: 1. Endotracheal tube stable position. NG tube below left hemidiaphragm. 2. Cardiomegaly with bibasilar infiltrates and bilateral pleural effusions, progressed from prior exam. CHF could present this fashion. Bibasilar pneumonia can not be excluded. Electronically Signed   By: TMarcello Moores Register   On: 03/22/2018 06:27   Dg Chest Port 1 View  Result Date: 03/22/2018 CLINICAL DATA:  Central line placement EXAM: PORTABLE CHEST 1 VIEW COMPARISON:  03/22/2018 at 0324 hours FINDINGS: Endotracheal tube terminates 2.8 cm above the carina. Enteric tube courses into the stomach. Moderate right and small left pleural effusions. Associated left lower lobe opacity, likely atelectasis. Mild frank interstitial edema. No pneumothorax. Cardiomegaly. IMPRESSION: Endotracheal tube terminates 2.8 cm above the carina. Enteric tube courses into the stomach. Cardiomegaly with mild interstitial edema. Moderate right and small left pleural effusions. Electronically Signed   By: SJulian HyM.D.   On: 03/22/2018 07:10   Dg Chest Port 1 View  Result Date: 03/21/2018 CLINICAL DATA:  Status post intubation EXAM: PORTABLE CHEST 1 VIEW COMPARISON:  None. FINDINGS: Endotracheal tube tip is about 3.3 cm superior to  carina. Esophageal tube tip is below diaphragm but non included in the field of view. Small bilateral pleural effusions and bibasilar dense airspace disease. Mild cardiomegaly. No pneumothorax. IMPRESSION: 1. Endotracheal tube tip about 3.3 cm superior to carina 2. Interval development of small bilateral pleural effusions and dense bibasilar atelectasis or pneumonia Electronically Signed   By: Donavan Foil M.D.   On: 03/21/2018 17:44   Dg Abd Portable 1v  Result Date: 03/21/2018 CLINICAL DATA:  NG tube EXAM: PORTABLE ABDOMEN - 1 VIEW COMPARISON:  CT 03/20/2018 FINDINGS: Limited by habitus. Esophageal tube tip overlies the distal  stomach. Consolidation at both bases. Nonobstructed gas pattern. IMPRESSION: Esophageal tube tip overlies the distal stomach Electronically Signed   By: Donavan Foil M.D.   On: 03/21/2018 17:45   [place] Ekg Site Rite  Result Date: 03/21/2018 If Site Rite image not attached, placement could not be confirmed due to current cardiac rhythm.     Assessment & Plan: Randy Deleon is a 53 y.o. black male with diabetes mellitus type II, hypertension, hyperlipidemia, EtOH abuse, tobacco abuse, who was admitted to [facility] on 03/20/2018 for Acute pancreatitis, unspecified complication status, unspecified pancreatitis type [K85.90] DKA (diabetic ketoacidoses) ([place]) [E11.10]  1. Acute renal failure: oliguric. With proteinuria and metabolic acidosis Baseline creatinine 0.62 11/25/17. Normal GFR Urinalysis consistent with underlying diabetic nephropathy. Acute renal failure secondary to acute illness and increased intraabdominal pressure.  Requiring vasopressors - Start continuous renal replacement therapy with CVVHD.  Orders prepared. No ultrafiltration.   2. Metabolic acidosis: secondary to diabetic ketoacidosis. Anion gap currently closed at 8.  - insulin gtt - D5W infusion  3. Acute pancreatitis: with history of alcohol abuse. CT reviewed.  - requiring vasopressors: phenylephrine and vasopressin - hydrocortisone - cefepime and metronidazole - NGT to suction.   4. Acute respiratory failure: status post intubation.   LOS: 2 Roe Koffman 1/3/20209:06 AM

## 2018-03-22 NOTE — Consult Note (Signed)
PHARMACY CONSULT NOTE - FOLLOW UP  Pharmacy Consult for Electrolyte Monitoring and Replacement   Recent Labs: Potassium (mmol/L)  Date Value  03/22/2018 3.6   Magnesium (mg/dL)  Date Value  32/35/5732 1.8   Calcium (mg/dL)  Date Value  20/25/4270 5.9 (LL)   Albumin (g/dL)  Date Value  62/37/6283 2.2 (L)  11/20/2017 4.2   Phosphorus (mg/dL)  Date Value  15/17/6160 2.9   Assessment: Pharmacy consulted for electrolyte monitoring and replacement in 53 yo male started on CRRT 1/3.   Goal of Therapy:  Electrolytes WNL  Plan:  1/3 @ 1727 Mg 1.8, K:3.6, Corrected Ca: 7.3   Will order Calcium 1g IV x 1 dose, Will continue to monitor Mg/K  (no replenishment at this check)    Pharmacy will continue to follow and replace electrolytes as needed while on CRRT.   Albina Billet, PharmD, BCPS Clinical Pharmacist 03/22/2018 6:53 PM

## 2018-03-22 NOTE — Progress Notes (Signed)
Pharmacy Antibiotic Note  Randy Deleon is a 53 y.o. male admitted on 03/20/2018 with intra-abdominal infx.  Pharmacy has been consulted for zosyn dosing.  Plan: Zosyn 3.375g IV q8h (4 hour infusion).  Height: 5\' 9"  (175.3 cm) Weight: 241 lb 2.9 oz (109.4 kg) IBW/kg (Calculated) : 70.7  Temp (24hrs), Avg:99.9 F (37.7 C), Min:98.8 F (37.1 C), Max:101.2 F (38.4 C)  Recent Labs  Lab 03/20/18 1350 03/20/18 1620 03/20/18 2306 03/21/18 0400 03/21/18 0739 03/21/18 1222 03/21/18 1615 03/21/18 2014  WBC 15.2* 17.4* 9.8 9.6  --   --   --   --   CREATININE 0.95 0.89 0.81 0.96 1.08 1.81* 2.40* 3.19*    Estimated Creatinine Clearance: 33.8 mL/min (A) (by C-G formula based on SCr of 3.19 mg/dL (H)).    Allergies  Allergen Reactions  . Other     Lima beans  Per testing.  . Shellfish Allergy      Cannot recall reaction,Per your allergy testing showed that a allergy to shellfish.     Thank you for allowing pharmacy to be a part of this patient's care.  Thomasene Ripple, PharmD, BCPS Clinical Pharmacist 03/22/2018

## 2018-03-22 NOTE — Progress Notes (Signed)
Inpatient Diabetes Program Recommendations  AACE/ADA: New Consensus Statement on Inpatient Glycemic Control (2019)  Target Ranges:  Prepandial:   less than 140 mg/dL      Peak postprandial:   less than 180 mg/dL (1-2 hours)      Critically ill patients:  140 - 180 mg/dL  Results for Randy Deleon, Randy Deleon (MRN [id number]) as of 03/22/2018 10:27  Ref. Range 03/21/2018 23:56 03/22/2018 01:42 03/22/2018 04:09 03/22/2018 05:44 03/22/2018 08:07 03/22/2018 08:52 03/22/2018 09:29  Glucose-Capillary Latest Ref Range: 70 - 99 mg/dL 91 291 (H) 93 97 76 58 (L) 98    Review of Glycemic Control Diabetes history: DM2 Outpatient Diabetes medications: Kombiglyze 07-998 mg BID, Invokana 300 mg daily Current orders for Inpatient glycemic control: IV insulin drip at 5 units/hour  Inpatient Diabetes Program Recommendations:   Insulin - IV drip/GlucoStabilizer: Noted IV insulin drip is set at continuous rate of 5 units/hour. Glucose 76 mg/dl at 9:16 and 58 mg/dl at 6:06 today. Recommend using Glucostabilizer per ICU Phase 2 order set to adjust insulin drip rate hourly instead of using continuous set rate.  Oral Agents: At time of discharge, recommend discontinuing Kombiglyze and Invokana and have patient take insulin as an outpatient.  HgbA1C: Last A1C 12.3% on 11/20/17 indicating an average glucose of 306 mg/dl.  NOTE: Noted patient's condition has declined and he is now intubated, receiving CVVHD, on stress dose steroids, and on vasopressors.  Recommend patient use insulin as an outpatient. Not able to discuss with patient today due to current condition. Will plan to have Diabetes Coordinator follow up with patient on Monday (03/25/18) if appropriate.  Thanks, Orlando Penner, RN, MSN, CDE Diabetes Coordinator Inpatient Diabetes Program [phone] (Team Pager from 8am to 5pm)

## 2018-03-22 NOTE — Progress Notes (Signed)
Pharmacy Antibiotic Note  Randy Deleon is a 53 y.o. male admitted on 03/20/2018 with intra-abdominal infx.  Pharmacy has been consulted for cefepime dosing.  Plan: Zosyn has been discontinued and will start cefepime 2g IV q12h  Height: 5\' 9"  (175.3 cm) Weight: 241 lb 2.9 oz (109.4 kg) IBW/kg (Calculated) : 70.7  Temp (24hrs), Avg:100 F (37.8 C), Min:98.8 F (37.1 C), Max:101.2 F (38.4 C)  Recent Labs  Lab 03/20/18 1350 03/20/18 1620 03/20/18 2306 03/21/18 0400 03/21/18 0739 03/21/18 1222 03/21/18 1615 03/21/18 2014 03/22/18 0025 03/22/18 0028  WBC 15.2* 17.4* 9.8 9.6  --   --   --   --  10.4  --   CREATININE 0.95 0.89 0.81 0.96 1.08 1.81* 2.40* 3.19* 3.45*  --   LATICACIDVEN  --   --   --   --   --   --   --   --   --  1.9    Estimated Creatinine Clearance: 31.2 mL/min (A) (by C-G formula based on SCr of 3.45 mg/dL (H)).    Allergies  Allergen Reactions  . Other     Lima beans  Per testing.  . Shellfish Allergy      Cannot recall reaction,Per your allergy testing showed that a allergy to shellfish.    Thank you for allowing pharmacy to be a part of this patient's care.  Thomasene Ripple, PharmD, BCPS Clinical Pharmacist 03/22/2018

## 2018-03-22 NOTE — Progress Notes (Signed)
CRRT access clotted off. Rinseback completed. Access packed with TPA. Will let dwell, then restart CRRT.

## 2018-03-22 NOTE — Consult Note (Signed)
Reason for Consult:concern for abdominal compartment syndrome Referring Physician: Luther Redo, Nephrology  Randy Deleon is an 53 y.o. male.  HPI: He was admitted to the hospital with nausea vomiting and abdominal pain.  Apparently this has been going on for several days.  He was found to have an elevated lipase level and anion gap.  He was dehydrated and also found to be in diabetic ketoacidosis.  CT scan was consistent with acute pancreatitis.  He does have a history of extensive alcohol abuse.  While he was initially admitted to the floor, his condition progressively worsened and he was put in the ICU.  He is currently intubated and sedated.  He is on 2 vasoactive infusions.  He is on 100% FiO2 on the ventilator.  He has started to go through alcohol withdrawal and developed oliguric renal failure.  Repeat CT scan was concerning for worsening pancreatitis.  Due to difficulty with CRRT, general surgery was consulted regarding the possibility of abdominal compartment syndrome.  Past Medical History:  Diagnosis Date  . Diabetes mellitus without complication (HCC)   . Hypertension     Past Surgical History:  Procedure Laterality Date  . none      Family History  Problem Relation Age of Onset  . Diabetes Mother   . Hypertension Mother   . Congestive Heart Failure Father   . Diabetes Father   . Hypertension Father     Social History:  reports that he has been smoking cigarettes. He started smoking about 21 years ago. He has a 15.00 pack-year smoking history. He has never used smokeless tobacco. He reports previous alcohol use. He reports previous drug use.  Allergies:  Allergies  Allergen Reactions  . Other     Lima beans  Per testing.  . Shellfish Allergy      Cannot recall reaction,Per your allergy testing showed that a allergy to shellfish.     Medications: I have reviewed the patient's current medications.  Results for orders placed or performed during the hospital encounter of  03/20/18 (from the past 48 hour(s))  Blood gas, venous     Status: Abnormal   Collection Time: 03/20/18  6:20 PM  Result Value Ref Range   pH, Ven 7.16 (LL) 7.250 - 7.430    Comment: CRITICAL RESULT CALLED TO, READ BACK BY AND VERIFIED WITH:  BOOME, RN AT 1920 ON [id number]    pCO2, Ven 61 (H) 44.0 - 60.0 mmHg   pO2, Ven 34.0 32.0 - 45.0 mmHg   Bicarbonate 21.7 20.0 - 28.0 mmol/L   Acid-base deficit 7.8 (H) 0.0 - 2.0 mmol/L   O2 Saturation 46.9 %   Patient temperature 37.0    Sample type VENOUS     Comment: Performed at [facility], [address] [place], [place] [address]  Glucose, capillary     Status: Abnormal   Collection Time: 03/20/18  9:01 PM  Result Value Ref Range   Glucose-Capillary 280 (H) 70 - 99 mg/dL  Glucose, capillary     Status: Abnormal   Collection Time: 03/20/18 10:57 PM  Result Value Ref Range   Glucose-Capillary 280 (H) 70 - 99 mg/dL   Comment 1 Notify RN   MRSA PCR Screening     Status: None   Collection Time: 03/20/18 11:00 PM  Result Value Ref Range   MRSA by PCR NEGATIVE NEGATIVE    Comment:        The GeneXpert MRSA Assay (FDA approved for NASAL specimens only), is  one component of a comprehensive MRSA colonization surveillance program. It is not intended to diagnose MRSA infection nor to guide or monitor treatment for MRSA infections. Performed at [facility], [address] [place], [place] [address]   Basic metabolic panel     Status: Abnormal   Collection Time: 03/20/18 11:06 PM  Result Value Ref Range   Sodium 134 (L) 135 - 145 mmol/L   Potassium 4.5 3.5 - 5.1 mmol/L    Comment: HEMOLYSIS AT THIS LEVEL MAY AFFECT RESULT   Chloride 103 98 - 111 mmol/L   CO2 16 (L) 22 - 32 mmol/L   Glucose, Bld 297 (H) 70 - 99 mg/dL   BUN 11 6 - 20 mg/dL   Creatinine, Ser 6.04 0.61 - 1.24 mg/dL   Calcium 7.9 (L) 8.9 - 10.3 mg/dL   GFR calc non Af Amer >60 >60 mL/min   GFR calc Af Amer >60 >60 mL/min   Anion gap 15 5 - 15     Comment: Performed at [facility], [address] [place], [place] [address]  HIV antibody (Routine Testing)     Status: None   Collection Time: 03/20/18 11:06 PM  Result Value Ref Range   HIV Screen 4th Generation wRfx Non Reactive Non Reactive    Comment: (NOTE) Performed At: [facility] [address] [place], [place] [id number] Jolene Schimke MD FA:[id number]   CBC     Status: Abnormal   Collection Time: 03/20/18 11:06 PM  Result Value Ref Range   WBC 9.8 4.0 - 10.5 K/uL   RBC 5.34 4.22 - 5.81 MIL/uL   Hemoglobin 18.0 (H) 13.0 - 17.0 g/dL   HCT 69.6 (H) 29.5 - 28.4 %   MCV 99.1 80.0 - 100.0 fL   MCH 33.7 26.0 - 34.0 pg   MCHC 34.0 30.0 - 36.0 g/dL   RDW 13.2 44.0 - 10.2 %   Platelets 84 (L) 150 - 400 K/uL    Comment: Immature Platelet Fraction may be clinically indicated, consider ordering this additional test [id number]    nRBC 0.0 0.0 - 0.2 %    Comment: Performed at [facility], [address] [place], [place] [address]  Influenza panel by PCR (type A & B)     Status: None   Collection Time: 03/20/18 11:40 PM  Result Value Ref Range   Influenza A By PCR NEGATIVE NEGATIVE   Influenza B By PCR NEGATIVE NEGATIVE    Comment: (NOTE) The Xpert Xpress Flu assay is intended as an aid in the diagnosis of  influenza and should not be used as a sole basis for treatment.  This  assay is FDA approved for nasopharyngeal swab specimens only. Nasal  washings and aspirates are unacceptable for Xpert Xpress Flu testing. Performed at [facility], [address] [place], [place] [address]   Glucose, capillary     Status: Abnormal   Collection Time: 03/20/18 11:58 PM  Result Value Ref Range   Glucose-Capillary 330 (H) 70 - 99 mg/dL  Glucose, capillary     Status: Abnormal   Collection Time: 03/21/18 12:55 AM  Result Value Ref Range   Glucose-Capillary 264 (H) 70 - 99 mg/dL   Comment 1 Notify RN   Glucose, capillary     Status: Abnormal    Collection Time: 03/21/18  1:56 AM  Result Value Ref Range   Glucose-Capillary 250 (H) 70 - 99 mg/dL   Comment 1 Notify RN   Glucose, capillary  Status: Abnormal   Collection Time: 03/21/18  2:57 AM  Result Value Ref Range   Glucose-Capillary 193 (H) 70 - 99 mg/dL   Comment 1 Notify RN   Glucose, capillary     Status: Abnormal   Collection Time: 03/21/18  3:53 AM  Result Value Ref Range   Glucose-Capillary 200 (H) 70 - 99 mg/dL   Comment 1 Notify RN   Urine Drug Screen, Qualitative ([facility] only)     Status: Abnormal   Collection Time: 03/21/18  3:56 AM  Result Value Ref Range   Tricyclic, Ur Screen NONE DETECTED NONE DETECTED   Amphetamines, Ur Screen NONE DETECTED NONE DETECTED   MDMA (Ecstasy)Ur Screen NONE DETECTED NONE DETECTED   Cocaine Metabolite,Ur [place] NONE DETECTED NONE DETECTED   Opiate, Ur Screen POSITIVE (A) NONE DETECTED   Phencyclidine (PCP) Ur S NONE DETECTED NONE DETECTED   Cannabinoid 50 Ng, Ur  NONE DETECTED NONE DETECTED   Barbiturates, Ur Screen NONE DETECTED NONE DETECTED   Benzodiazepine, Ur Scrn NONE DETECTED NONE DETECTED   Methadone Scn, Ur NONE DETECTED NONE DETECTED    Comment: (NOTE) Tricyclics + metabolites, urine    Cutoff 1000 ng/mL Amphetamines + metabolites, urine  Cutoff 1000 ng/mL MDMA (Ecstasy), urine              Cutoff 500 ng/mL Cocaine Metabolite, urine          Cutoff 300 ng/mL Opiate + metabolites, urine        Cutoff 300 ng/mL Phencyclidine (PCP), urine         Cutoff 25 ng/mL Cannabinoid, urine                 Cutoff 50 ng/mL Barbiturates + metabolites, urine  Cutoff 200 ng/mL Benzodiazepine, urine              Cutoff 200 ng/mL Methadone, urine                   Cutoff 300 ng/mL The urine drug screen provides only a preliminary, unconfirmed analytical test result and should not be used for non-medical purposes. Clinical consideration and professional judgment should be applied to any positive drug screen result due to  possible interfering substances. A more specific alternate chemical method must be used in order to obtain a confirmed analytical result. Gas chromatography / mass spectrometry (GC/MS) is the preferred confirmat ory method. Performed at [facility], [address] [place], [place] [address]   Comprehensive metabolic panel     Status: Abnormal   Collection Time: 03/21/18  4:00 AM  Result Value Ref Range   Sodium 136 135 - 145 mmol/L   Potassium 4.6 3.5 - 5.1 mmol/L   Chloride 107 98 - 111 mmol/L   CO2 18 (L) 22 - 32 mmol/L   Glucose, Bld 186 (H) 70 - 99 mg/dL   BUN 14 6 - 20 mg/dL   Creatinine, Ser 6.04 0.61 - 1.24 mg/dL   Calcium 7.8 (L) 8.9 - 10.3 mg/dL   Total Protein 6.2 (L) 6.5 - 8.1 g/dL   Albumin 3.4 (L) 3.5 - 5.0 g/dL   AST 57 (H) 15 - 41 U/L   ALT 34 0 - 44 U/L   Alkaline Phosphatase 50 38 - 126 U/L   Total Bilirubin 1.4 (H) 0.3 - 1.2 mg/dL   GFR calc non Af Amer >60 >60 mL/min   GFR calc Af Amer >60 >60 mL/min   Anion gap 11 5 - 15  Comment: Performed at [facility], [address] [place], [place] [address]  CBC     Status: Abnormal   Collection Time: 03/21/18  4:00 AM  Result Value Ref Range   WBC 9.6 4.0 - 10.5 K/uL   RBC 5.56 4.22 - 5.81 MIL/uL   Hemoglobin 18.7 (H) 13.0 - 17.0 g/dL   HCT 60.4 (H) 54.0 - 98.1 %   MCV 99.8 80.0 - 100.0 fL   MCH 33.6 26.0 - 34.0 pg   MCHC 33.7 30.0 - 36.0 g/dL   RDW 19.1 47.8 - 29.5 %   Platelets 94 (L) 150 - 400 K/uL    Comment: Immature Platelet Fraction may be clinically indicated, consider ordering this additional test [id number]    nRBC 0.0 0.0 - 0.2 %    Comment: Performed at [facility], [address] [place], [place] [address]  Triglycerides     Status: Abnormal   Collection Time: 03/21/18  4:00 AM  Result Value Ref Range   Triglycerides 701 (H) <150 mg/dL    Comment: Performed at [facility] [address] [place], [place] [address]  Glucose, capillary      Status: Abnormal   Collection Time: 03/21/18  4:56 AM  Result Value Ref Range   Glucose-Capillary 176 (H) 70 - 99 mg/dL   Comment 1 Notify RN   Glucose, capillary     Status: Abnormal   Collection Time: 03/21/18  5:58 AM  Result Value Ref Range   Glucose-Capillary 147 (H) 70 - 99 mg/dL   Comment 1 Notify RN   Glucose, capillary     Status: Abnormal   Collection Time: 03/21/18  6:56 AM  Result Value Ref Range   Glucose-Capillary 148 (H) 70 - 99 mg/dL   Comment 1 Notify RN   Basic metabolic panel     Status: Abnormal   Collection Time: 03/21/18  7:39 AM  Result Value Ref Range   Sodium 134 (L) 135 - 145 mmol/L   Potassium 4.7 3.5 - 5.1 mmol/L   Chloride 106 98 - 111 mmol/L   CO2 20 (L) 22 - 32 mmol/L   Glucose, Bld 136 (H) 70 - 99 mg/dL   BUN 18 6 - 20 mg/dL   Creatinine, Ser 8.41 0.61 - 1.24 mg/dL   Calcium 7.6 (L) 8.9 - 10.3 mg/dL   GFR calc non Af Amer >60 >60 mL/min   GFR calc Af Amer >60 >60 mL/min   Anion gap 8 5 - 15    Comment: Performed at [facility], [address] [place], [place] [address]  Glucose, capillary     Status: Abnormal   Collection Time: 03/21/18  8:00 AM  Result Value Ref Range   Glucose-Capillary 121 (H) 70 - 99 mg/dL  Glucose, capillary     Status: Abnormal   Collection Time: 03/21/18  8:59 AM  Result Value Ref Range   Glucose-Capillary 157 (H) 70 - 99 mg/dL  Glucose, capillary     Status: Abnormal   Collection Time: 03/21/18  9:59 AM  Result Value Ref Range   Glucose-Capillary 139 (H) 70 - 99 mg/dL  Glucose, capillary     Status: Abnormal   Collection Time: 03/21/18 10:56 AM  Result Value Ref Range   Glucose-Capillary 148 (H) 70 - 99 mg/dL  Glucose, capillary     Status: Abnormal   Collection Time: 03/21/18 11:59 AM  Result Value Ref Range   Glucose-Capillary 144 (H) 70 - 99 mg/dL  Basic metabolic panel     Status: Abnormal   Collection Time: 03/21/18 12:22 PM  Result Value Ref Range   Sodium 134 (L) 135 - 145 mmol/L    Potassium 5.3 (H) 3.5 - 5.1 mmol/L    Comment: HEMOLYSIS AT THIS LEVEL MAY AFFECT RESULT   Chloride 106 98 - 111 mmol/L   CO2 20 (L) 22 - 32 mmol/L   Glucose, Bld 155 (H) 70 - 99 mg/dL   BUN 22 (H) 6 - 20 mg/dL   Creatinine, Ser 1.61 (H) 0.61 - 1.24 mg/dL   Calcium 7.5 (L) 8.9 - 10.3 mg/dL   GFR calc non Af Amer 43 (L) >60 mL/min   GFR calc Af Amer 49 (L) >60 mL/min   Anion gap 8 5 - 15    Comment: Performed at [facility], [address] [place], [place] [address]  Triglycerides     Status: Abnormal   Collection Time: 03/21/18 12:22 PM  Result Value Ref Range   Triglycerides 764 (H) <150 mg/dL    Comment: Performed at [facility], [address] [place], [place] [address]  Glucose, capillary     Status: Abnormal   Collection Time: 03/21/18 12:55 PM  Result Value Ref Range   Glucose-Capillary 157 (H) 70 - 99 mg/dL  Glucose, capillary     Status: Abnormal   Collection Time: 03/21/18  1:58 PM  Result Value Ref Range   Glucose-Capillary 150 (H) 70 - 99 mg/dL  Glucose, capillary     Status: Abnormal   Collection Time: 03/21/18  3:03 PM  Result Value Ref Range   Glucose-Capillary 133 (H) 70 - 99 mg/dL  Glucose, capillary     Status: Abnormal   Collection Time: 03/21/18  3:49 PM  Result Value Ref Range   Glucose-Capillary 113 (H) 70 - 99 mg/dL  Basic metabolic panel     Status: Abnormal   Collection Time: 03/21/18  4:15 PM  Result Value Ref Range   Sodium 135 135 - 145 mmol/L   Potassium 5.5 (H) 3.5 - 5.1 mmol/L    Comment: HEMOLYSIS AT THIS LEVEL MAY AFFECT RESULT   Chloride 108 98 - 111 mmol/L   CO2 19 (L) 22 - 32 mmol/L   Glucose, Bld 121 (H) 70 - 99 mg/dL   BUN 26 (H) 6 - 20 mg/dL   Creatinine, Ser 1.19 (H) 0.61 - 1.24 mg/dL   Calcium 7.1 (L) 8.9 - 10.3 mg/dL   GFR calc non Af Amer 30 (L) >60 mL/min   GFR calc Af Amer 35 (L) >60 mL/min   Anion gap 8 5 - 15    Comment: Performed at [facility], [address] [place], [place] [address]   Triglycerides     Status: Abnormal   Collection Time: 03/21/18  4:15 PM  Result Value Ref Range   Triglycerides 720 (H) <150 mg/dL    Comment: Performed at [facility], [address] [place], [place] [address]  Glucose, capillary     Status: Abnormal   Collection Time: 03/21/18  5:15 PM  Result Value Ref Range   Glucose-Capillary 115 (H) 70 - 99 mg/dL  Blood gas, arterial     Status: Abnormal   Collection Time: 03/21/18  5:33 PM  Result Value Ref Range   FIO2 1.00    Delivery systems VENTILATOR    Mode PRESSURE REGULATED VOLUME CONTROL    VT 500 mL   LHR 20 resp/min   Peep/cpap 5.0  cm H20   pH, Arterial 7.18 (LL) 7.350 - 7.450    Comment: CRITICAL RESULT CALLED TO, READ BACK BY AND VERIFIED WITH:  DR Belia Heman @ 1738 ON 03/21/18 BY LS      pCO2 arterial 56 (H) 32.0 - 48.0 mmHg   pO2, Arterial 74 (L) 83.0 - 108.0 mmHg   Bicarbonate 20.9 20.0 - 28.0 mmol/L   Acid-base deficit 8.0 (H) 0.0 - 2.0 mmol/L   O2 Saturation 90.2 %   Patient temperature 37.0    Collection site RIGHT RADIAL    Sample type ARTERIAL DRAW    Allens test (pass/fail) PASS PASS    Comment: Performed at [facility], [address] [place], [place] [address]  Glucose, capillary     Status: None   Collection Time: 03/21/18  6:15 PM  Result Value Ref Range   Glucose-Capillary 90 70 - 99 mg/dL  Glucose, capillary     Status: None   Collection Time: 03/21/18  6:57 PM  Result Value Ref Range   Glucose-Capillary 91 70 - 99 mg/dL   Comment 1 Notify RN   Glucose, capillary     Status: None   Collection Time: 03/21/18  8:00 PM  Result Value Ref Range   Glucose-Capillary 93 70 - 99 mg/dL   Comment 1 Notify RN   Basic metabolic panel     Status: Abnormal   Collection Time: 03/21/18  8:14 PM  Result Value Ref Range   Sodium 136 135 - 145 mmol/L   Potassium 5.6 (H) 3.5 - 5.1 mmol/L    Comment: HEMOLYSIS AT THIS LEVEL MAY AFFECT RESULT   Chloride 108 98 - 111 mmol/L   CO2 19 (L) 22 - 32 mmol/L    Glucose, Bld 96 70 - 99 mg/dL   BUN 31 (H) 6 - 20 mg/dL   Creatinine, Ser 9.76 (H) 0.61 - 1.24 mg/dL   Calcium 7.1 (L) 8.9 - 10.3 mg/dL   GFR calc non Af Amer 21 (L) >60 mL/min   GFR calc Af Amer 25 (L) >60 mL/min   Anion gap 9 5 - 15    Comment: Performed at [facility], [address] [place], [place] [address]  Blood gas, arterial     Status: Abnormal   Collection Time: 03/21/18  9:46 PM  Result Value Ref Range   FIO2 1.00    Delivery systems VENTILATOR    Mode PRESSURE REGULATED VOLUME CONTROL    VT 500 mL   LHR 24 resp/min   Peep/cpap 5.0 cm H20   pH, Arterial 7.25 (L) 7.350 - 7.450   pCO2 arterial 47 32.0 - 48.0 mmHg   pO2, Arterial 86 83.0 - 108.0 mmHg   Bicarbonate 20.6 20.0 - 28.0 mmol/L   Acid-base deficit 6.7 (H) 0.0 - 2.0 mmol/L   O2 Saturation 94.7 %   Patient temperature 37.0    Collection site RIGHT RADIAL    Sample type ARTERIAL DRAW    Allens test (pass/fail) PASS PASS    Comment: Performed at [facility], [address] [place], [place] [address]  Glucose, capillary     Status: None   Collection Time: 03/21/18  9:55 PM  Result Value Ref Range   Glucose-Capillary 98 70 - 99 mg/dL  Glucose, capillary     Status: None   Collection Time: 03/21/18 10:54 PM  Result Value Ref Range   Glucose-Capillary 92 70 - 99 mg/dL   Comment 1 Notify RN   Glucose, capillary  Status: None   Collection Time: 03/21/18 11:56 PM  Result Value Ref Range   Glucose-Capillary 91 70 - 99 mg/dL   Comment 1 Notify RN   Triglycerides     Status: Abnormal   Collection Time: 03/22/18 12:25 AM  Result Value Ref Range   Triglycerides 680 (H) <150 mg/dL    Comment: Performed at [facility], [address] [place], [place] [address]  Basic metabolic panel     Status: Abnormal   Collection Time: 03/22/18 12:25 AM  Result Value Ref Range   Sodium 138 135 - 145 mmol/L   Potassium 5.0 3.5 - 5.1 mmol/L   Chloride 108 98 - 111 mmol/L   CO2 21 (L) 22  - 32 mmol/L   Glucose, Bld 88 70 - 99 mg/dL   BUN 33 (H) 6 - 20 mg/dL   Creatinine, Ser 6.043.45 (H) 0.61 - 1.24 mg/dL   Calcium 6.6 (L) 8.9 - 10.3 mg/dL   GFR calc non Af Amer 20 (L) >60 mL/min   GFR calc Af Amer 23 (L) >60 mL/min   Anion gap 9 5 - 15    Comment: Performed at [facility], [address] [place], [place] [address]  CBC     Status: Abnormal   Collection Time: 03/22/18 12:25 AM  Result Value Ref Range   WBC 10.4 4.0 - 10.5 K/uL   RBC 5.26 4.22 - 5.81 MIL/uL   Hemoglobin 17.5 (H) 13.0 - 17.0 g/dL   HCT 11.951.8 14.739.0 - 82.952.0 %   MCV 98.5 80.0 - 100.0 fL   MCH 33.3 26.0 - 34.0 pg   MCHC 33.8 30.0 - 36.0 g/dL   RDW 56.212.6 13.011.5 - 86.515.5 %   Platelets 57 (L) 150 - 400 K/uL    Comment: Immature Platelet Fraction may be clinically indicated, consider ordering this additional test [id number]    nRBC 0.0 0.0 - 0.2 %    Comment: Performed at [facility], [address] [place], [place] [address]  Procalcitonin - Baseline     Status: None   Collection Time: 03/22/18 12:25 AM  Result Value Ref Range   Procalcitonin 24.31 ng/mL    Comment:        Interpretation: PCT >= 10 ng/mL: Important systemic inflammatory response, almost exclusively due to severe bacterial sepsis or septic shock. (NOTE)       Sepsis PCT Algorithm           Lower Respiratory Tract                                      Infection PCT Algorithm    ----------------------------     ----------------------------         PCT < 0.25 ng/mL                PCT < 0.10 ng/mL         Strongly encourage             Strongly discourage   discontinuation of antibiotics    initiation of antibiotics    ----------------------------     -----------------------------       PCT 0.25 - 0.50 ng/mL            PCT 0.10 - 0.25 ng/mL               OR       >80% decrease  in PCT            Discourage initiation of                                            antibiotics      Encourage discontinuation           of  antibiotics    ----------------------------     -----------------------------         PCT >= 0.50 ng/mL              PCT 0.26 - 0.50 ng/mL                AND       <80% decrease in PCT             Encourage initiation of                                             antibiotics       Encourage continuation           of antibiotics    ----------------------------     -----------------------------        PCT >= 0.50 ng/mL                  PCT > 0.50 ng/mL               AND         increase in PCT                  Strongly encourage                                      initiation of antibiotics    Strongly encourage escalation           of antibiotics                                     -----------------------------                                           PCT <= 0.25 ng/mL                                                 OR                                        > 80% decrease in PCT                                     Discontinue / Do not initiate  antibiotics Performed at [facility], [address] [place], [place] [address]   Lactic acid, plasma     Status: None   Collection Time: 03/22/18 12:28 AM  Result Value Ref Range   Lactic Acid, Venous 1.9 0.5 - 1.9 mmol/L    Comment: Performed at [facility], [address] [place], [place] [address]  Glucose, capillary     Status: Abnormal   Collection Time: 03/22/18  1:42 AM  Result Value Ref Range   Glucose-Capillary 101 (H) 70 - 99 mg/dL   Comment 1 Notify RN   Blood gas, arterial     Status: Abnormal   Collection Time: 03/22/18  3:08 AM  Result Value Ref Range   FIO2 1.00    Delivery systems VENTILATOR    Mode PRESSURE REGULATED VOLUME CONTROL    VT 500 mL   LHR 24 resp/min   Peep/cpap 5.0 cm H20   pH, Arterial 7.33 (L) 7.350 - 7.450   pCO2 arterial 40 32.0 - 48.0 mmHg   pO2, Arterial 102 83.0 - 108.0 mmHg   Bicarbonate 22.1 20.0 - 28.0 mmol/L   Acid-base  deficit 3.3 (H) 0.0 - 2.0 mmol/L   O2 Saturation 97.1 %   Patient temperature 38.1    Collection site LEFT RADIAL    Sample type ARTERIAL DRAW    Allens test (pass/fail) PASS PASS    Comment: Performed at [facility], [address] [place], [place] [address]  Lipase, blood     Status: Abnormal   Collection Time: 03/22/18  3:47 AM  Result Value Ref Range   Lipase 606 (H) 11 - 51 U/L    Comment: RESULT CONFIRMED BY MANUAL DILUTION K[facility] Performed at [facility], [address] [place], [place] [address]   CBC     Status: Abnormal   Collection Time: 03/22/18  3:47 AM  Result Value Ref Range   WBC 14.8 (H) 4.0 - 10.5 K/uL   RBC 5.01 4.22 - 5.81 MIL/uL   Hemoglobin 16.8 13.0 - 17.0 g/dL   HCT 95.249.7 84.139.0 - 32.452.0 %   MCV 99.2 80.0 - 100.0 fL   MCH 33.5 26.0 - 34.0 pg   MCHC 33.8 30.0 - 36.0 g/dL   RDW 40.112.7 02.711.5 - 25.315.5 %   Platelets 71 (L) 150 - 400 K/uL    Comment: Immature Platelet Fraction may be clinically indicated, consider ordering this additional test [id number]    nRBC 0.0 0.0 - 0.2 %    Comment: Performed at [facility], [address] [place], [place] [address]  Basic metabolic panel     Status: Abnormal   Collection Time: 03/22/18  3:47 AM  Result Value Ref Range   Sodium 139 135 - 145 mmol/L   Potassium 4.5 3.5 - 5.1 mmol/L   Chloride 107 98 - 111 mmol/L   CO2 22 22 - 32 mmol/L   Glucose, Bld 80 70 - 99 mg/dL   BUN 35 (H) 6 - 20 mg/dL   Creatinine, Ser 6.383.86 (H) 0.61 - 1.24 mg/dL   Calcium 6.8 (L) 8.9 - 10.3 mg/dL   GFR calc non Af Amer 17 (L) >60 mL/min   GFR calc Af Amer 20 (L) >60 mL/min   Anion gap 10 5 - 15    Comment: Performed at [facility], [address] [place], [place] [address]  Lactic acid, plasma     Status: Abnormal   Collection Time: 03/22/18  3:47 AM  Result Value Ref Range   Lactic Acid, Venous 2.0 (HH) 0.5 - 1.9 mmol/L    Comment: CRITICAL RESULT CALLED TO, READ BACK BY AND VERIFIED WITH MISTY  CAUSEY 03/22/18 0431 KBH Performed at [facility], [address] [place], [place] [address]   Glucose, capillary     Status: None   Collection Time: 03/22/18  4:09 AM  Result Value Ref Range   Glucose-Capillary 93 70 - 99 mg/dL   Comment 1 Notify RN   Glucose, capillary     Status: None   Collection Time: 03/22/18  5:44 AM  Result Value Ref Range   Glucose-Capillary 97 70 - 99 mg/dL   Comment 1 Notify RN   Glucose, capillary     Status: None   Collection Time: 03/22/18  8:07 AM  Result Value Ref Range   Glucose-Capillary 76 70 - 99 mg/dL  Triglycerides     Status: Abnormal   Collection Time: 03/22/18  8:21 AM  Result Value Ref Range   Triglycerides 543 (H) <150 mg/dL    Comment: Performed at [facility], [address] [place], [place] [address]  Basic metabolic panel     Status: Abnormal   Collection Time: 03/22/18  8:21 AM  Result Value Ref Range   Sodium 139 135 - 145 mmol/L   Potassium 3.9 3.5 - 5.1 mmol/L   Chloride 110 98 - 111 mmol/L   CO2 21 (L) 22 - 32 mmol/L   Glucose, Bld 72 70 - 99 mg/dL   BUN 36 (H) 6 - 20 mg/dL   Creatinine, Ser 0.98 (H) 0.61 - 1.24 mg/dL   Calcium 6.3 (LL) 8.9 - 10.3 mg/dL    Comment: CRITICAL RESULT CALLED TO, READ BACK BY AND VERIFIED WITH [facility] OLEJAR AT 1191 03/22/2018 DAS    GFR calc non Af Amer 16 (L) >60 mL/min   GFR calc Af Amer 18 (L) >60 mL/min   Anion gap 8 5 - 15    Comment: Performed at [facility], [address] [place], [place] [address]  Glucose, capillary     Status: Abnormal   Collection Time: 03/22/18  8:52 AM  Result Value Ref Range   Glucose-Capillary 58 (L) 70 - 99 mg/dL  Glucose, capillary     Status: None   Collection Time: 03/22/18  9:29 AM  Result Value Ref Range   Glucose-Capillary 98 70 - 99 mg/dL  Renal function     Status: Abnormal   Collection Time: 03/22/18 10:03 AM  Result Value Ref Range   Sodium 139 135 - 145 mmol/L   Potassium 4.1 3.5 - 5.1 mmol/L   Chloride 108  98 - 111 mmol/L   CO2 21 (L) 22 - 32 mmol/L   Glucose, Bld 81 70 - 99 mg/dL   BUN 38 (H) 6 - 20 mg/dL   Creatinine, Ser 5.62 (H) 0.61 - 1.24 mg/dL   Calcium 6.3 (LL) 8.9 - 10.3 mg/dL    Comment: CRITICAL RESULT CALLED TO, READ BACK BY AND VERIFIED WITH [facility] OLEJAR AT 1035 03/22/2018 DAS    Phosphorus 2.5 2.5 - 4.6 mg/dL   Albumin 2.5 (L) 3.5 - 5.0 g/dL   GFR calc non Af Amer 15 (L) >60 mL/min   GFR calc Af Amer 18 (L) >60 mL/min   Anion gap 10 5 - 15    Comment: Performed at [facility], [address], [place], [place] [address]  Magnesium     Status: Abnormal  Collection Time: 03/22/18 10:03 AM  Result Value Ref Range   Magnesium 1.6 (L) 1.7 - 2.4 mg/dL    Comment: Performed at [facility], [address] [place], [place] [address]  Glucose, capillary     Status: Abnormal   Collection Time: 03/22/18 12:00 PM  Result Value Ref Range   Glucose-Capillary 66 (L) 70 - 99 mg/dL  Glucose, capillary     Status: Abnormal   Collection Time: 03/22/18 12:40 PM  Result Value Ref Range   Glucose-Capillary 148 (H) 70 - 99 mg/dL  Triglycerides     Status: Abnormal   Collection Time: 03/22/18  1:20 PM  Result Value Ref Range   Triglycerides 483 (H) <150 mg/dL    Comment: Performed at [facility], [address] [place], [place] [address]  Renal function     Status: Abnormal   Collection Time: 03/22/18  1:20 PM  Result Value Ref Range   Sodium 138 135 - 145 mmol/L   Potassium 3.9 3.5 - 5.1 mmol/L   Chloride 107 98 - 111 mmol/L   CO2 22 22 - 32 mmol/L   Glucose, Bld 158 (H) 70 - 99 mg/dL   BUN 35 (H) 6 - 20 mg/dL   Creatinine, Ser 0.98 (H) 0.61 - 1.24 mg/dL   Calcium 6.6 (L) 8.9 - 10.3 mg/dL   Phosphorus 2.9 2.5 - 4.6 mg/dL   Albumin 2.3 (L) 3.5 - 5.0 g/dL   GFR calc non Af Amer 19 (L) >60 mL/min   GFR calc Af Amer 22 (L) >60 mL/min   Anion gap 9 5 - 15    Comment: Performed at [facility], [address], [place], [place] [address]   Magnesium     Status: Abnormal   Collection Time: 03/22/18  1:20 PM  Result Value Ref Range   Magnesium 1.4 (L) 1.7 - 2.4 mg/dL    Comment: Performed at [facility], [address] [place], [place] [address]  Glucose, capillary     Status: Abnormal   Collection Time: 03/22/18  2:14 PM  Result Value Ref Range   Glucose-Capillary 157 (H) 70 - 99 mg/dL  Glucose, capillary     Status: Abnormal   Collection Time: 03/22/18  4:17 PM  Result Value Ref Range   Glucose-Capillary 169 (H) 70 - 99 mg/dL    Ct Abdomen Pelvis Wo Contrast  Result Date: 03/22/2018 CLINICAL DATA:  Acute onset of increased work of breathing. Severe generalized abdominal pain and distention. Recently diagnosed with acute pancreatitis. EXAM: CT ABDOMEN AND PELVIS WITHOUT CONTRAST TECHNIQUE: Multidetector CT imaging of the abdomen and pelvis was performed following the standard protocol without IV contrast. COMPARISON:  CT of the abdomen and pelvis performed 03/20/2018 FINDINGS: Lower chest: Small bilateral pleural effusions are noted. Partial consolidation of both lower lobes likely reflects atelectasis. Diffuse coronary artery calcifications are seen. Hepatobiliary: The liver is unremarkable in appearance. Residual contrast is noted within the gallbladder. The common bile duct is normal in caliber. The common bile duct remains normal in caliber. Pancreas: Diffuse soft tissue inflammation is again noted about the pancreas, reflecting known pancreatitis. There is increased fluid tracking about the greater curvature of the stomach, raising concern for evolving pseudocyst. Diffuse mesenteric edema is seen tracking inferiorly, with fluid tracking along the paracolic gutters bilaterally into the pelvis. Evaluation for devascularization is markedly limited without contrast. Spleen: The spleen is unremarkable in appearance. Adrenals/Urinary Tract: The adrenal glands are unremarkable in appearance. The kidneys are  within normal  limits. There is no evidence of hydronephrosis. No renal or ureteral stones are identified; increased attenuation within the renal calyces reflects minimal residual contrast. Mild bilateral perinephric stranding is noted. Stomach/Bowel: The stomach is unremarkable in appearance. The small bowel is within normal limits. The appendix is not visualized; there is no evidence for appendicitis. The colon is unremarkable in appearance. [name] enteric tube is noted ending at the first segment of the duodenum. Vascular/Lymphatic: Scattered calcification is seen along the abdominal aorta and its branches. The abdominal aorta is otherwise grossly unremarkable. The inferior vena cava is grossly unremarkable. No retroperitoneal lymphadenopathy is seen. No pelvic sidewall lymphadenopathy is identified. Reproductive: The bladder is decompressed, with a Foley catheter in place. The prostate is borderline normal in size. Other: No additional soft tissue abnormalities are seen. A left femoral line is noted. Musculoskeletal: No acute osseous abnormalities are identified. The visualized musculature is unremarkable in appearance. IMPRESSION: 1. Pancreatitis again noted. Increased fluid tracking about the greater curvature of the stomach, raising concern for evolving pseudocyst. Diffuse mesenteric edema tracks inferiorly, with fluid tracking along the paracolic gutters bilaterally into the pelvis. The amount of fluid has increased since the prior study, concerning for worsening pancreatitis. Evaluation for devascularization is markedly limited without contrast. 2. Small bilateral pleural effusions. Partial consolidation of both lower lung lobes likely reflects atelectasis. 3. Diffuse coronary artery calcifications seen. Aortic Atherosclerosis (ICD10-I70.0). Electronically Signed   By: Roanna Raider M.D.   On: 03/22/2018 03:12   Ct Abdomen Pelvis W Contrast  Result Date: 03/20/2018 CLINICAL DATA:  Hyperglycemia, nausea vomiting  EXAM: CT ABDOMEN AND PELVIS WITH CONTRAST TECHNIQUE: Multidetector CT imaging of the abdomen and pelvis was performed using the standard protocol following bolus administration of intravenous contrast. CONTRAST:  ISOVUE-300 IOPAMIDOL (ISOVUE-300) INJECTION 61% COMPARISON:  08/15/2006 UGI FINDINGS: Lower chest: Lung bases demonstrate no consolidation or pleural effusion. Borderline cardiomegaly. Coronary calcifications. Small hiatal hernia Hepatobiliary: Steatosis. No calcified gallstone or biliary dilatation Pancreas: Severe peripancreatic inflammatory changes consistent with pancreatitis. Moderate fluid within the mesentery and pararenal spaces. No ductal dilatation. Spleen: Normal in size without focal abnormality. Adrenals/Urinary Tract: Adrenal glands are normal. No hydronephrosis. Subcentimeter hypodensity mid left kidney too small to further characterize. Bladder normal Stomach/Bowel: Stomach is within normal limits. Appendix appears normal. Wall thickening of the anterior wall of the second and third portions of the duodenum. Vascular/Lymphatic: Mild aortic atherosclerosis without aneurysm. Normal portal vein, mesenteric vein, and splenic vein enhancement. Mildly prominent peripancreatic lymph nodes best seen on coronal views. Reproductive: Prostate is unremarkable. Other: Negative for free air. Small free fluid within the upper abdomen. Oval calcification within the central mesentery, possible calcified node. Musculoskeletal: No acute or suspicious abnormality IMPRESSION: 1. Marked edema and fluid surrounding the pancreas, consistent with acute pancreatitis. No organized fluid collections or evidence for necrosis at this time. 2. Anterior wall thickening of the second and third portion of duodenum, likely reactive/due to inflammatory changes in the pancreas. 3. Hepatic steatosis Electronically Signed   By: Jasmine Pang M.D.   On: 03/20/2018 19:18   Dg Chest Port 1 View  Result Date:  03/22/2018 CLINICAL DATA:  Respiratory failure. EXAM: PORTABLE CHEST 1 VIEW COMPARISON:  03/21/2018. FINDINGS: Endotracheal tube in stable position. NG tube below left hemidiaphragm. Cardiomegaly. Bilateral pulmonary infiltrates and bilateral pleural effusions progressed from prior exam. CHF and bibasilar pneumonia could present this fashion. No pneumothorax. IMPRESSION: 1. Endotracheal tube stable position. NG tube below left hemidiaphragm. 2. Cardiomegaly with  bibasilar infiltrates and bilateral pleural effusions, progressed from prior exam. CHF could present this fashion. Bibasilar pneumonia can not be excluded. Electronically Signed   By: Maisie Fus  Register   On: 03/22/2018 06:27   Dg Chest Port 1 View  Result Date: 03/22/2018 CLINICAL DATA:  Central line placement EXAM: PORTABLE CHEST 1 VIEW COMPARISON:  03/22/2018 at 0324 hours FINDINGS: Endotracheal tube terminates 2.8 cm above the carina. Enteric tube courses into the stomach. Moderate right and small left pleural effusions. Associated left lower lobe opacity, likely atelectasis. Mild frank interstitial edema. No pneumothorax. Cardiomegaly. IMPRESSION: Endotracheal tube terminates 2.8 cm above the carina. Enteric tube courses into the stomach. Cardiomegaly with mild interstitial edema. Moderate right and small left pleural effusions. Electronically Signed   By: Charline Bills M.D.   On: 03/22/2018 07:10   Dg Chest Port 1 View  Result Date: 03/21/2018 CLINICAL DATA:  Status post intubation EXAM: PORTABLE CHEST 1 VIEW COMPARISON:  None. FINDINGS: Endotracheal tube tip is about 3.3 cm superior to carina. Esophageal tube tip is below diaphragm but non included in the field of view. Small bilateral pleural effusions and bibasilar dense airspace disease. Mild cardiomegaly. No pneumothorax. IMPRESSION: 1. Endotracheal tube tip about 3.3 cm superior to carina 2. Interval development of small bilateral pleural effusions and dense bibasilar atelectasis or  pneumonia Electronically Signed   By: Jasmine Pang M.D.   On: 03/21/2018 17:44   Dg Abd Portable 1v  Result Date: 03/21/2018 CLINICAL DATA:  NG tube EXAM: PORTABLE ABDOMEN - 1 VIEW COMPARISON:  CT 03/20/2018 FINDINGS: Limited by habitus. Esophageal tube tip overlies the distal stomach. Consolidation at both bases. Nonobstructed gas pattern. IMPRESSION: Esophageal tube tip overlies the distal stomach Electronically Signed   By: Jasmine Pang M.D.   On: 03/21/2018 17:45   [place] Ekg Site Rite  Result Date: 03/21/2018 If Site Rite image not attached, placement could not be confirmed due to current cardiac rhythm.   Review of Systems  Unable to perform ROS: Critical illness   Blood pressure 102/71, pulse 89, temperature 99.5 F (37.5 C), resp. rate (!) 24, height 5\' 9"  (1.753 m), weight 109.4 kg, SpO2 92 %. Physical Exam  Vitals reviewed. Constitutional:  He is intubated and sedated.  HENT:  Head: Normocephalic and atraumatic.  Eyes: Right eye exhibits no discharge. Left eye exhibits no discharge.  Neck:  Endotracheal and orogastric tube in place.  He is currently supported on the ventilator with 100% FiO2, PEEP of 5, peak airway pressures appear to be around 30.  Cardiovascular: Normal rate and regular rhythm.  Respiratory:  Intubated with mechanical sounds.  GI: Soft. He exhibits distension.  To sedation, I am unable to assess for rebound.  There is no involuntary guarding.  Musculoskeletal:        General: No deformity.  Neurological:  Sedated on the ventilator.  Skin: Skin is warm and dry.  Psychiatric:  Sedated on the ventilator.    Assessment/Plan: Is a 53 year old man admitted with acute pancreatitis.  He also has diabetic ketoacidosis.  He is currently in multisystem organ failure, developing ARDS, oliguric renal failure, and worsening pancreatitis.  The bedside nurse evaluated his bladder pressure and it was 12.  At this time I do not believe he meets criteria for abdominal  compartment syndrome.  I do think, however, that clinical situation is one that would better be managed at a tertiary level of care.  I have discussed my concerns with the critical care provider, Dr. Lonn Georgia.  He has agreed to make an effort to transfer this patient to a higher level of care.  I have [facility].  At this time no abdominal decompression is indicated.  I would recommend checking bladder pressures every 4 hours.  Please contact me if they are over 20.  At that point I would recommend paralysis and reevaluation.  If he has persistently elevated pressures over 20 even while paralyzed then we may need to consider abdominal decompression.  I do not believe we have the capability for bedside abdominal decompression and he would require a trip to the OR.  Surgery will continue to follow.  Duanne Guess 03/22/2018, 5:36 PM

## 2018-03-22 NOTE — Progress Notes (Signed)
[facility] - [place] [facility]   PATIENT NAME: Randy DistanceKevin Deleon    MR#:  [id number]  DATE OF BIRTH:  24-Jun-1965  SUBJECTIVE:  CHIEF COMPLAINT: Patient is on vent.  Intubated on 03/21/2018 On pressors  REVIEW OF SYSTEMS:  Review of system unobtainable  DRUG ALLERGIES:   Allergies  Allergen Reactions  . Other     Lima beans  Per testing.  . Shellfish Allergy      Cannot recall reaction,Per your allergy testing showed that a allergy to shellfish.     VITALS:  Blood pressure 115/77, pulse 84, temperature 98.4 F (36.9 C), resp. rate (!) 24, height 5\' 9"  (1.753 m), weight 109.4 kg, SpO2 95 %.  PHYSICAL EXAMINATION:  GENERAL:  53 y.o.-year-old patient lying in the bed with no acute distress.  EYES: Pupils equal, round, reactive to light and accommodation. No scleral icterus.  HEENT: Head atraumatic, normocephalic. Oropharynx and nasopharynx clear.  NECK:  Supple, no jugular venous distention. No thyroid enlargement, no tenderness.  LUNGS: Normal breath sounds bilaterally, no wheezing, rales,rhonchi or crepitation. No use of accessory muscles of respiration.  CARDIOVASCULAR: S1, S2 normal. No murmurs, rubs, or gallops.  ABDOMEN: Soft, nontender, nondistended. Bowel sounds present.  EXTREMITIES: No pedal edema, cyanosis, or clubbing.  NEUROLOGIC: Altered gait not checked.  PSYCHIATRIC: The patient is lethargic SKIN: No obvious rash, lesion, or ulcer.    LABORATORY PANEL:   CBC Recent Labs  Lab 03/22/18 0347  WBC 14.8*  HGB 16.8  HCT 49.7  PLT 71*   ------------------------------------------------------------------------------------------------------------------  Chemistries  Recent Labs  Lab 03/21/18 0400  03/22/18 1320  NA 136   < > 138  K 4.6   < > 3.9  CL 107   < > 107  CO2 18*   < > 22  GLUCOSE 186*   < > 158*  BUN 14   < > 35*  CREATININE 0.96   < > 3.56*  CALCIUM 7.8*   < > 6.6*  MG  --    < > 1.4*  AST 57*  --   --   ALT 34   --   --   ALKPHOS 50  --   --   BILITOT 1.4*  --   --    < > = values in this interval not displayed.   ------------------------------------------------------------------------------------------------------------------  Cardiac Enzymes Recent Labs  Lab 03/20/18 1715  TROPONINI <0.03   ------------------------------------------------------------------------------------------------------------------  RADIOLOGY:  Ct Abdomen Pelvis Wo Contrast  Result Date: 03/22/2018 CLINICAL DATA:  Acute onset of increased work of breathing. Severe generalized abdominal pain and distention. Recently diagnosed with acute pancreatitis. EXAM: CT ABDOMEN AND PELVIS WITHOUT CONTRAST TECHNIQUE: Multidetector CT imaging of the abdomen and pelvis was performed following the standard protocol without IV contrast. COMPARISON:  CT of the abdomen and pelvis performed 03/20/2018 FINDINGS: Lower chest: Small bilateral pleural effusions are noted. Partial consolidation of both lower lobes likely reflects atelectasis. Diffuse coronary artery calcifications are seen. Hepatobiliary: The liver is unremarkable in appearance. Residual contrast is noted within the gallbladder. The common bile duct is normal in caliber. The common bile duct remains normal in caliber. Pancreas: Diffuse soft tissue inflammation is again noted about the pancreas, reflecting known pancreatitis. There is increased fluid tracking about the greater curvature of the stomach, raising concern for evolving pseudocyst. Diffuse mesenteric edema is seen tracking inferiorly, with fluid tracking along the paracolic gutters bilaterally into the pelvis. Evaluation for devascularization is markedly limited without contrast. Spleen:  The spleen is unremarkable in appearance. Adrenals/Urinary Tract: The adrenal glands are unremarkable in appearance. The kidneys are within normal limits. There is no evidence of hydronephrosis. No renal or ureteral stones are identified;  increased attenuation within the renal calyces reflects minimal residual contrast. Mild bilateral perinephric stranding is noted. Stomach/Bowel: The stomach is unremarkable in appearance. The small bowel is within normal limits. The appendix is not visualized; there is no evidence for appendicitis. The colon is unremarkable in appearance. [name] enteric tube is noted ending at the first segment of the duodenum. Vascular/Lymphatic: Scattered calcification is seen along the abdominal aorta and its branches. The abdominal aorta is otherwise grossly unremarkable. The inferior vena cava is grossly unremarkable. No retroperitoneal lymphadenopathy is seen. No pelvic sidewall lymphadenopathy is identified. Reproductive: The bladder is decompressed, with a Foley catheter in place. The prostate is borderline normal in size. Other: No additional soft tissue abnormalities are seen. A left femoral line is noted. Musculoskeletal: No acute osseous abnormalities are identified. The visualized musculature is unremarkable in appearance. IMPRESSION: 1. Pancreatitis again noted. Increased fluid tracking about the greater curvature of the stomach, raising concern for evolving pseudocyst. Diffuse mesenteric edema tracks inferiorly, with fluid tracking along the paracolic gutters bilaterally into the pelvis. The amount of fluid has increased since the prior study, concerning for worsening pancreatitis. Evaluation for devascularization is markedly limited without contrast. 2. Small bilateral pleural effusions. Partial consolidation of both lower lung lobes likely reflects atelectasis. 3. Diffuse coronary artery calcifications seen. Aortic Atherosclerosis (ICD10-I70.0). Electronically Signed   By: Roanna Raider M.D.   On: 03/22/2018 03:12   Ct Abdomen Pelvis W Contrast  Result Date: 03/20/2018 CLINICAL DATA:  Hyperglycemia, nausea vomiting EXAM: CT ABDOMEN AND PELVIS WITH CONTRAST TECHNIQUE: Multidetector CT imaging of the  abdomen and pelvis was performed using the standard protocol following bolus administration of intravenous contrast. CONTRAST:  ISOVUE-300 IOPAMIDOL (ISOVUE-300) INJECTION 61% COMPARISON:  08/15/2006 UGI FINDINGS: Lower chest: Lung bases demonstrate no consolidation or pleural effusion. Borderline cardiomegaly. Coronary calcifications. Small hiatal hernia Hepatobiliary: Steatosis. No calcified gallstone or biliary dilatation Pancreas: Severe peripancreatic inflammatory changes consistent with pancreatitis. Moderate fluid within the mesentery and pararenal spaces. No ductal dilatation. Spleen: Normal in size without focal abnormality. Adrenals/Urinary Tract: Adrenal glands are normal. No hydronephrosis. Subcentimeter hypodensity mid left kidney too small to further characterize. Bladder normal Stomach/Bowel: Stomach is within normal limits. Appendix appears normal. Wall thickening of the anterior wall of the second and third portions of the duodenum. Vascular/Lymphatic: Mild aortic atherosclerosis without aneurysm. Normal portal vein, mesenteric vein, and splenic vein enhancement. Mildly prominent peripancreatic lymph nodes best seen on coronal views. Reproductive: Prostate is unremarkable. Other: Negative for free air. Small free fluid within the upper abdomen. Oval calcification within the central mesentery, possible calcified node. Musculoskeletal: No acute or suspicious abnormality IMPRESSION: 1. Marked edema and fluid surrounding the pancreas, consistent with acute pancreatitis. No organized fluid collections or evidence for necrosis at this time. 2. Anterior wall thickening of the second and third portion of duodenum, likely reactive/due to inflammatory changes in the pancreas. 3. Hepatic steatosis Electronically Signed   By: Jasmine Pang M.D.   On: 03/20/2018 19:18   Dg Chest Port 1 View  Result Date: 03/22/2018 CLINICAL DATA:  Respiratory failure. EXAM: PORTABLE CHEST 1 VIEW COMPARISON:  03/21/2018.  FINDINGS: Endotracheal tube in stable position. NG tube below left hemidiaphragm. Cardiomegaly. Bilateral pulmonary infiltrates and bilateral pleural effusions progressed from prior exam. CHF and bibasilar pneumonia could present  this fashion. No pneumothorax. IMPRESSION: 1. Endotracheal tube stable position. NG tube below left hemidiaphragm. 2. Cardiomegaly with bibasilar infiltrates and bilateral pleural effusions, progressed from prior exam. CHF could present this fashion. Bibasilar pneumonia can not be excluded. Electronically Signed   By: Maisie Fus  Register   On: 03/22/2018 06:27   Dg Chest Port 1 View  Result Date: 03/22/2018 CLINICAL DATA:  Central line placement EXAM: PORTABLE CHEST 1 VIEW COMPARISON:  03/22/2018 at 0324 hours FINDINGS: Endotracheal tube terminates 2.8 cm above the carina. Enteric tube courses into the stomach. Moderate right and small left pleural effusions. Associated left lower lobe opacity, likely atelectasis. Mild frank interstitial edema. No pneumothorax. Cardiomegaly. IMPRESSION: Endotracheal tube terminates 2.8 cm above the carina. Enteric tube courses into the stomach. Cardiomegaly with mild interstitial edema. Moderate right and small left pleural effusions. Electronically Signed   By: Charline Bills M.D.   On: 03/22/2018 07:10   Dg Chest Port 1 View  Result Date: 03/21/2018 CLINICAL DATA:  Status post intubation EXAM: PORTABLE CHEST 1 VIEW COMPARISON:  None. FINDINGS: Endotracheal tube tip is about 3.3 cm superior to carina. Esophageal tube tip is below diaphragm but non included in the field of view. Small bilateral pleural effusions and bibasilar dense airspace disease. Mild cardiomegaly. No pneumothorax. IMPRESSION: 1. Endotracheal tube tip about 3.3 cm superior to carina 2. Interval development of small bilateral pleural effusions and dense bibasilar atelectasis or pneumonia Electronically Signed   By: Jasmine Pang M.D.   On: 03/21/2018 17:44   Dg Abd Portable  1v  Result Date: 03/21/2018 CLINICAL DATA:  NG tube EXAM: PORTABLE ABDOMEN - 1 VIEW COMPARISON:  CT 03/20/2018 FINDINGS: Limited by habitus. Esophageal tube tip overlies the distal stomach. Consolidation at both bases. Nonobstructed gas pattern. IMPRESSION: Esophageal tube tip overlies the distal stomach Electronically Signed   By: Jasmine Pang M.D.   On: 03/21/2018 17:45   [place] Ekg Site Rite  Result Date: 03/21/2018 If Site Rite image not attached, placement could not be confirmed due to current cardiac rhythm.   EKG:   Orders placed or performed during the hospital encounter of 03/20/18  . ED EKG  . ED EKG  . EKG 12-Lead  . EKG 12-Lead    ASSESSMENT AND PLAN:    53 year old male patient with history of alcohol use, tobacco use, diabetes mellitus type 2, hypertension presented to the emergency room for abdominal pain, nausea and vomiting  -Septic shock Broad-spectrum IV antibiotics, follow-up on the cultures, continue pressors on vasopressin and norepinephrine, stress dose steroids  -Acute respiratory failure Got intubated.  Vent management per intensivist FiO2 100%  -DKA Resolved   #Acute kidney injury from dehydration from DKA CRRT Aggressive hydration with IV fluids, avoid nephrotoxins and monitor renal function closely.  Follow-up with nephrology  #Hyperkalemia-resolved  #Hypocalcemia replete    -Acute pancreatitis-be from high triglycerides  continue insulin drip Aggressive IV fluid hydration Monitor lipase leve Triglycerides [phone]  CT abdomen -no pancreatic necrosis and pseudocyst  -Alcohol abuse  on CIWA protocol Thiamine and folic acid supplements   -Tobacco abuse Tobacco cessation counseled to the patient for 6 minutes by admitting physician Nicotine patch offered    -DVT prophylaxis subcu Lovenox daily    All the records are reviewed and case discussed with Care Management/Social Workerr. Management plans discussed with the patient,  family and they are in agreement.  CODE STATUS: fc   TOTAL TIME TAKING CARE OF THIS PATIENT: 35  minutes.   POSSIBLE D/C IN  ??  DAYS, DEPENDING ON CLINICAL CONDITION.  Note: This dictation was prepared with Dragon dictation along with smaller phrase technology. Any transcriptional errors that result from this process are unintentional.   Ramonita LabAruna Ashlie Mcmenamy M.D on 03/22/2018 at 2:35 PM  Between 7am to 6pm - Pager - [phone] After 6pm go to [email or web address] - password [email or web address] [email or web address]  [facility]  Office  [phone]  CC: Primary care physician; Sherron Mondayejan-Sie, S Ahmed, MD

## 2018-03-22 NOTE — Progress Notes (Signed)
Patient transported with RN and NT, to CT and back. No complications. Patient placed back on servo.

## 2018-03-22 NOTE — Progress Notes (Signed)
Initial Nutrition Assessment  DOCUMENTATION CODES:   Obesity unspecified  INTERVENTION:   If tube feeds initiated, recommend vital HP at 42ml/hr + Prostat 72ml TID  Free water flushes 13ml q 4 hours  Regimen provides 1440kcal/day, 164g/day protein, 87ml/day water   Dextrose 10% @50ml /hr provides 408 additional kcals  Recommend liquid MVI daily via tube  Recommend Ocuvite multivitamin daily via tube to replace looses from CRRT  Recommend B-complex with C daily via tube to replace losses from CRRT  Recommend folic acid 1mg  daily via tube in setting of etoh abuse  Pt likely at refeed risk; recommend monitor K, Mg and P labs daily   NUTRITION DIAGNOSIS:   Inadequate oral intake related to acute illness(pt sedated and ventilated) as evidenced by NPO status.  GOAL:   Provide needs based on ASPEN/SCCM guidelines  MONITOR:   Vent status, Labs, Weight trends, Skin, I & O's  REASON FOR ASSESSMENT:   Ventilator    ASSESSMENT:   53 y.o. male.  With a past medical history remarkable for hypertension, type 2 diabetes, DKA, presented with nausea, vomiting, found to be in DKA with an anion gap of 20, CT scan of the abdomen showed acute pancreatitis as patient does have a regular EtOH history.  Was admitted to the intensive care unit for pain control, insulin infusion, placed on CIWA, risk of DT's   Pt sedated and ventilated. No family at bedside. OGT to suction with output of bile-like fluid. On insulin CRI and 10% dextrose. On CIWA. Worsening renal function; plan for CRRT today. UOP . No clear weight history in chart to determine if any recent weight loss. No plans for tube feeds today; pt may require post pyloric tube for feedings once stable.   Medications reviewed and include: heparin, solu-cortef, nicotine, thiamine, cefepime, dextrose 10% @50ml /hr, fentanyl, metronidazole, neo-synephrine, vasopressin  Labs reviewed: K 4.1 wnl, BUN 38(H), creat 4.27(H), Ca 6.3(L)  adj. 7.5(L), P 2.5 wnl, alb 2.5(L) Triglycerides- 543(H) Vitamin D 10.3(L)- 9/3 Wbc- 14.8(H) cbgs- 76, 58, 98, 66, 148 x 24 hrs  Patient is currently intubated on ventilator support MV: 11.5 L/min Temp (24hrs), Avg:99.9 F (37.7 C), Min:97.9 F (36.6 C), Max:101.2 F (38.4 C)  Propofol: none  MAP- >71mmHg  NUTRITION - FOCUSED PHYSICAL EXAM:    Most Recent Value  Orbital Region  No depletion  Upper Arm Region  No depletion  Thoracic and Lumbar Region  No depletion  Buccal Region  No depletion  Temple Region  No depletion  Clavicle Bone Region  No depletion  Clavicle and Acromion Bone Region  No depletion  Scapular Bone Region  No depletion  Dorsal Hand  No depletion  Patellar Region  No depletion  Anterior Thigh Region  No depletion  Posterior Calf Region  No depletion  Edema (RD Assessment)  None  Hair  Reviewed  Eyes  Reviewed  Mouth  Reviewed  Skin  Reviewed  Nails  Reviewed     Diet Order:   Diet Order            Diet NPO time specified  Diet effective now             EDUCATION NEEDS:   No education needs have been identified at this time  Skin:  Skin Assessment: Reviewed RN Assessment  Last BM:  1/3- type 7  Height:   Ht Readings from Last 1 Encounters:  03/20/18 5\' 9"  (1.753 m)    Weight:   Wt Readings from Last  1 Encounters:  03/20/18 109.4 kg    Ideal Body Weight:  72.7 kg  BMI:  Body mass index is 35.62 kg/m.  Estimated Nutritional Needs:   Kcal:  1199-1526kcal/day   Protein:  >145g/day   Fluid:  >2.2L/day   Betsey Holidayasey Maxmilian Trostel MS, RD, LDN Pager #- [phone] Office#- [phone] After Hours Pager: [phone]

## 2018-03-22 NOTE — Progress Notes (Signed)
Patient being transferred to [facility] ICU Bed #25.  Report called to [facility]  Notified [facility] for Transport request.   Planned rinse back of patients blood to patient from CRRT.  Lyla Son with [facility] report on patient.   Notified Mother Seward Grater of patient transfer, to Room #25 on [address], [place].

## 2018-03-22 NOTE — Progress Notes (Signed)
[facility]Link Physician-Brief Progress Note Patient Name: ERIOLUWA BEDELL DOB: 23-Sep-1965 MRN: [id number]   Date of Service  03/22/2018  HPI/Events of Note  Camera: asking for Duoneb for wheezing. Notes, labs ,meds seen.  Pancreatitis, AKI on CRRT, on Neo and Vaso. MAP > 65. sats 92%. Waiting to get bed to transfer to higher center for suspected ACS.  In synchrony, P peak fine.   eICU Interventions  Duoneb  prn for now Continue other care.      Intervention Category Major Interventions: Respiratory failure - evaluation and management;Hypotension - evaluation and management  Ranee Gosselin 03/22/2018, 8:42 PM

## 2018-03-22 NOTE — Progress Notes (Addendum)
Pharmacy Antibiotic Note  Randy Deleon is a 53 y.o. male admitted on 03/20/2018 with intra-abdominal infx.  Zosyn discontinued. Pharmacy has been consulted for cefepime dosing.  Plan: Addendum: CRRT started 1/3. Will adjust dose to cefepime 1g IV q8h  Height: 5\' 9"  (175.3 cm) Weight: 241 lb 2.9 oz (109.4 kg) IBW/kg (Calculated) : 70.7  Temp (24hrs), Avg:100.1 F (37.8 C), Min:98.8 F (37.1 C), Max:101.2 F (38.4 C)  Recent Labs  Lab 03/20/18 1620 03/20/18 2306 03/21/18 0400  03/21/18 1222 03/21/18 1615 03/21/18 2014 03/22/18 0025 03/22/18 0028 03/22/18 0347  WBC 17.4* 9.8 9.6  --   --   --   --  10.4  --  14.8*  CREATININE 0.89 0.81 0.96   < > 1.81* 2.40* 3.19* 3.45*  --  3.86*  LATICACIDVEN  --   --   --   --   --   --   --   --  1.9 2.0*   < > = values in this interval not displayed.    Estimated Creatinine Clearance: 27.9 mL/min (A) (by C-G formula based on SCr of 3.86 mg/dL (H)).    Allergies  Allergen Reactions  . Other     Lima beans  Per testing.  . Shellfish Allergy      Cannot recall reaction,Per your allergy testing showed that a allergy to shellfish.    Thank you for allowing pharmacy to be a part of this patient's care.  Gardner Candle, PharmD, BCPS Clinical Pharmacist 03/22/2018 12:41 PM

## 2018-03-22 NOTE — Progress Notes (Signed)
Attempted to withdraw tPA. Blue lumen gave tPA and blood back without difficulty, red lumen will not give tPA back at all. ICU MD notified, nephrologist paged, no new orders at this time. Will pack blue lumen with heparin at this time so it does not clot off.

## 2018-03-22 NOTE — Progress Notes (Signed)
Dr. Wynelle Link to bedside. Verbal order received to tPA line again and to let dwell for at least one hour, then reassess. tPA packed in both lumens of dialysis access line.

## 2018-03-22 NOTE — Procedures (Signed)
Hemodialysis Catheter Insertion Procedure Note Randy MeckelKevin W Deleon [id number] [birth year]  Procedure: Insertion of Hemodialysis Catheter Indications: Hemodialysis  Procedure Details Consent: Risks of procedure as well as the alternatives and risks of each were explained to the (patient/caregiver).  Consent for procedure obtained.  Time Out: Verified patient identification, verified procedure, site/side was marked, verified correct patient position, special equipment/implants available, medications/allergies/relevent history reviewed, required imaging and test results available.  Performed  Maximum sterile technique was used including antiseptics, cap, gloves, gown, hand hygiene, mask and sheet.  Skin prep: Chlorhexidine; local anesthetic administered  A Trialysis HD catheter was placed in the left femoral vein due to emergent situation using the Seldinger technique.  Evaluation Blood flow good Complications: No apparent complications Patient did tolerate procedure well. Chest X-ray ordered to verify placement.  CXR: Not applicable, inserted into left femoral vein.   Procedure performed with ultrasound guidance for real time vessel cannulation.     Randy DittyJeremiah Deleon, AGACNP-BC [facility] Pager: [phone] Cell: [phone]  03/22/2018, 1:26 AM

## 2018-03-22 NOTE — Consult Note (Signed)
PHARMACY CONSULT NOTE - INITIAL   Pharmacy Consult for Review Meds DAILY for adjustment due to CRRT  Labs: Recent Labs    03/20/18 1350 03/20/18 1620  03/21/18 0400  03/22/18 0025 03/22/18 0347 03/22/18 0821 03/22/18 1003  WBC 15.2* 17.4*   < > 9.6  --  10.4 14.8*  --   --   HGB 19.4* 19.7*   < > 18.7*  --  17.5* 16.8  --   --   HCT 56.6* 58.9*   < > 55.5*  --  51.8 49.7  --   --   PLT 112* 124*   < > 94*  --  57* 71*  --   --   CREATININE 0.95 0.89   < > 0.96   < > 3.45* 3.86* 4.11* 4.27*  PHOS  --   --   --   --   --   --   --   --  2.5  ALBUMIN 4.9 4.6  --  3.4*  --   --   --   --  2.5*  PROT 7.9 8.1  --  6.2*  --   --   --   --   --   AST 53* 48*  --  57*  --   --   --   --   --   ALT 45* 44  --  34  --   --   --   --   --   ALKPHOS 75 69  --  50  --   --   --   --   --   BILITOT 0.7 1.2  --  1.4*  --   --   --   --   --    < > = values in this interval not displayed.   Estimated Creatinine Clearance: 25.2 mL/min (A) (by C-G formula based on SCr of 4.27 mg/dL (H)).   Medications:  Scheduled:  . chlorhexidine gluconate (MEDLINE KIT)  15 mL Mouth Rinse BID  . enoxaparin (LOVENOX) injection  30 mg Subcutaneous Q24H  . fenofibrate  160 mg Oral Daily  . hydrocortisone sod succinate (SOLU-CORTEF) inj  50 mg Intravenous Q6H  . mouth rinse  15 mL Mouth Rinse 10 times per day  . metoprolol tartrate  5 mg Intravenous Q8H  . nicotine  21 mg Transdermal Daily  . sodium polystyrene  15 g Per Tube Once  . thiamine  100 mg Oral Daily   Or  . thiamine  100 mg Intravenous Daily   Infusions:  . sodium chloride Stopped (03/22/18 1144)  . [START ON 03/23/2018] ceFEPime (MAXIPIME) IV    . dextrose 50 mL/hr at 03/22/18 1210  . fentaNYL infusion INTRAVENOUS 400 mcg/hr (03/22/18 1146)  . insulin 2 Units/hr (03/22/18 1146)  . metronidazole 100 mL/hr at 03/22/18 1146  . phenylephrine (NEO-SYNEPHRINE) Adult infusion 130 mcg/min (03/22/18 1214)  . pureflow 2,000 mL/hr at 03/22/18 1032  .  vasopressin (PITRESSIN) infusion - *FOR SHOCK* 0.03 Units/min (03/22/18 1146)    Assessment: Pharmacy consulted for daily review of medications  for adjustment due to CRRT. CRRT started 1/3   Plan:  1/3 Medications have been reviewed Cefepime changed to 1g IV every 8 hours No other dose adjustments needed at this time.   Pharmacy will continue to review patient's medications daily for adjustment while on CRRT.    Pernell Dupre, PharmD, BCPS Clinical Pharmacist 03/22/2018 12:35 PM

## 2018-03-22 NOTE — Progress Notes (Signed)
Patient ID: Randy Deleon, male   DOB: [birth year], 53 y.o.   MRN: [id number] Pulmonary/critical care attending  Appreciate general surgery consultation. Discussed with family about transfer to tertiary care center [facility] to [facility] and to [facility].  Patient has been accepted but no beds available are on waiting list at respective institutions. Given a dose of vecuronium and bladder pressures measured which were 12. Follow closely  Tora Kindred, DO

## 2018-03-22 NOTE — Discharge Summary (Signed)
Physician Discharge Summary  Patient ID: Randy Deleon MRN: [id number] DOB/AGE: Jun 05, 1965 53 y.o.  Admit date: 03/20/2018 Discharge date: 03/22/2018  Admission Diagnoses:severe pancreatitis  Discharge Diagnoses:  Active Problems:   DKA (diabetic ketoacidoses) (HCC)   Pancreatitis, alcoholic, acute   Respiratory failure, acute (HCC)   Discharged Condition:STABLE  Hospital Course:  Admitted to ICU for severe pancreatitis Intubated for resp failure Started on CRRT  Consults: GEN SURGERY NEPHROLOGY  Significant Diagnostic Studies: CT abd worsening pancreatitis  Treatments:  Vasopressors ABX Stress dose steroids IV abx Vent support  CRRT  Discharge Exam: Blood pressure 108/66, pulse 83, temperature 97.7 F (36.5 C), resp. rate (!) 24, height 5\' 9"  (1.753 m), weight 109.4 kg, SpO2 (!) 88 %.   Disposition: [facility] hospital      Signed: Erin Fulling 03/22/2018, 10:44 PM

## 2018-03-22 NOTE — Care Management Note (Signed)
Case Management Note  Patient Details  Name: LANGLEY KROM MRN: [id number] Date of Birth: 1965-08-05  Subjective/Objective:     Patient admitted on 12/31 with pneumonia.  Patient also has acute pancreatitis.  Patient required intubation 1/2 and today CRRT started.  No family present at the bedside at this time.  RNCM will cont to follow and complete assessment at a later time. Robbie Lis RN BSN [phone]                Action/Plan:   Expected Discharge Date:                  Expected Discharge Plan:     In-House Referral:     Discharge planning Services     Post Acute Care Choice:    Choice offered to:     DME Arranged:    DME Agency:     HH Arranged:    HH Agency:     Status of Service:     If discussed at Long Length of Stay Meetings, dates discussed:    Additional Comments:  Allayne Butcher, RN 03/22/2018, 11:56 AM

## 2018-03-22 NOTE — Consult Note (Signed)
PHARMACY CONSULT NOTE - FOLLOW UP  Pharmacy Consult for Electrolyte Monitoring and Replacement   Recent Labs: Potassium (mmol/L)  Date Value  03/22/2018 3.9   Magnesium (mg/dL)  Date Value  16/10/960401/05/2018 1.4 (L)   Calcium (mg/dL)  Date Value  54/09/811901/05/2018 6.6 (L)   Albumin (g/dL)  Date Value  14/78/295601/05/2018 2.3 (L)  11/20/2017 4.2   Phosphorus (mg/dL)  Date Value  21/30/865701/05/2018 2.9   Assessment: Pharmacy consulted for electrolyte monitoring and replacement in 53 yo male started on CRRT 1/3.   Goal of Therapy:  Electrolytes WNL  Plan:  1/3 @ 1400 Mg 1.4, K:3.9, Corrected Ca: 7.96.  Magnesium 2g IV x 1 dose ordered. Calcium 1g IV x 1 dose ordered this morning.  Pharmacy will continue to follow and replace electrolytes as needed while on CRRT.   Gardner CandleSheema M Michaeljames Milnes, PharmD, BCPS Clinical Pharmacist 03/22/2018 3:27 PM

## 2018-03-23 ENCOUNTER — Inpatient Hospital Stay (HOSPITAL_COMMUNITY): Payer: BLUE CROSS/BLUE SHIELD

## 2018-03-23 ENCOUNTER — Encounter (HOSPITAL_COMMUNITY): Payer: Self-pay | Admitting: *Deleted

## 2018-03-23 ENCOUNTER — Inpatient Hospital Stay (HOSPITAL_COMMUNITY)
Admission: AD | Admit: 2018-03-23 | Discharge: 2018-03-30 | DRG: 438 | Disposition: A | Discharge status: Home Health | Payer: BLUE CROSS/BLUE SHIELD | Source: Other Acute Inpatient Hospital | Attending: Internal Medicine | Admitting: Internal Medicine

## 2018-03-23 ENCOUNTER — Other Ambulatory Visit: Payer: Self-pay

## 2018-03-23 DIAGNOSIS — N170 Acute kidney failure with tubular necrosis: Secondary | ICD-10-CM | POA: Diagnosis present

## 2018-03-23 DIAGNOSIS — J9600 Acute respiratory failure, unspecified whether with hypoxia or hypercapnia: Secondary | ICD-10-CM | POA: Diagnosis present

## 2018-03-23 DIAGNOSIS — Z91013 Allergy to seafood: Secondary | ICD-10-CM | POA: Diagnosis not present

## 2018-03-23 DIAGNOSIS — E1010 Type 1 diabetes mellitus with ketoacidosis without coma: Secondary | ICD-10-CM | POA: Diagnosis present

## 2018-03-23 DIAGNOSIS — I10 Essential (primary) hypertension: Secondary | ICD-10-CM | POA: Diagnosis not present

## 2018-03-23 DIAGNOSIS — R5381 Other malaise: Secondary | ICD-10-CM | POA: Diagnosis not present

## 2018-03-23 DIAGNOSIS — K852 Alcohol induced acute pancreatitis without necrosis or infection: Secondary | ICD-10-CM | POA: Diagnosis present

## 2018-03-23 DIAGNOSIS — G9341 Metabolic encephalopathy: Secondary | ICD-10-CM | POA: Diagnosis not present

## 2018-03-23 DIAGNOSIS — J80 Acute respiratory distress syndrome: Secondary | ICD-10-CM | POA: Diagnosis not present

## 2018-03-23 DIAGNOSIS — R579 Shock, unspecified: Secondary | ICD-10-CM

## 2018-03-23 DIAGNOSIS — J9601 Acute respiratory failure with hypoxia: Secondary | ICD-10-CM

## 2018-03-23 DIAGNOSIS — E1110 Type 2 diabetes mellitus with ketoacidosis without coma: Secondary | ICD-10-CM | POA: Diagnosis not present

## 2018-03-23 DIAGNOSIS — E8770 Fluid overload, unspecified: Secondary | ICD-10-CM | POA: Diagnosis not present

## 2018-03-23 DIAGNOSIS — Z8249 Family history of ischemic heart disease and other diseases of the circulatory system: Secondary | ICD-10-CM | POA: Diagnosis not present

## 2018-03-23 DIAGNOSIS — R4702 Dysphasia: Secondary | ICD-10-CM | POA: Diagnosis not present

## 2018-03-23 DIAGNOSIS — T508X5A Adverse effect of diagnostic agents, initial encounter: Secondary | ICD-10-CM | POA: Diagnosis not present

## 2018-03-23 DIAGNOSIS — Z91018 Allergy to other foods: Secondary | ICD-10-CM | POA: Diagnosis not present

## 2018-03-23 DIAGNOSIS — K760 Fatty (change of) liver, not elsewhere classified: Secondary | ICD-10-CM | POA: Diagnosis not present

## 2018-03-23 DIAGNOSIS — J90 Pleural effusion, not elsewhere classified: Secondary | ICD-10-CM | POA: Diagnosis not present

## 2018-03-23 DIAGNOSIS — K5900 Constipation, unspecified: Secondary | ICD-10-CM | POA: Diagnosis not present

## 2018-03-23 DIAGNOSIS — F10230 Alcohol dependence with withdrawal, uncomplicated: Secondary | ICD-10-CM | POA: Diagnosis not present

## 2018-03-23 DIAGNOSIS — R1310 Dysphagia, unspecified: Secondary | ICD-10-CM | POA: Diagnosis not present

## 2018-03-23 DIAGNOSIS — K859 Acute pancreatitis without necrosis or infection, unspecified: Secondary | ICD-10-CM | POA: Diagnosis present

## 2018-03-23 DIAGNOSIS — Z9911 Dependence on respirator [ventilator] status: Secondary | ICD-10-CM

## 2018-03-23 DIAGNOSIS — N179 Acute kidney failure, unspecified: Secondary | ICD-10-CM | POA: Diagnosis not present

## 2018-03-23 DIAGNOSIS — F17210 Nicotine dependence, cigarettes, uncomplicated: Secondary | ICD-10-CM | POA: Diagnosis present

## 2018-03-23 DIAGNOSIS — R069 Unspecified abnormalities of breathing: Secondary | ICD-10-CM

## 2018-03-23 DIAGNOSIS — Z833 Family history of diabetes mellitus: Secondary | ICD-10-CM | POA: Diagnosis not present

## 2018-03-23 DIAGNOSIS — R109 Unspecified abdominal pain: Secondary | ICD-10-CM | POA: Diagnosis present

## 2018-03-23 LAB — GLUCOSE, CAPILLARY
Glucose-Capillary: 125 mg/dL — ABNORMAL HIGH (ref 70–99)
Glucose-Capillary: 127 mg/dL — ABNORMAL HIGH (ref 70–99)
Glucose-Capillary: 137 mg/dL — ABNORMAL HIGH (ref 70–99)
Glucose-Capillary: 151 mg/dL — ABNORMAL HIGH (ref 70–99)
Glucose-Capillary: 157 mg/dL — ABNORMAL HIGH (ref 70–99)
Glucose-Capillary: 158 mg/dL — ABNORMAL HIGH (ref 70–99)
Glucose-Capillary: 158 mg/dL — ABNORMAL HIGH (ref 70–99)
Glucose-Capillary: 165 mg/dL — ABNORMAL HIGH (ref 70–99)
Glucose-Capillary: 166 mg/dL — ABNORMAL HIGH (ref 70–99)
Glucose-Capillary: 183 mg/dL — ABNORMAL HIGH (ref 70–99)
Glucose-Capillary: 185 mg/dL — ABNORMAL HIGH (ref 70–99)
Glucose-Capillary: 215 mg/dL — ABNORMAL HIGH (ref 70–99)
Glucose-Capillary: 220 mg/dL — ABNORMAL HIGH (ref 70–99)
Glucose-Capillary: 223 mg/dL — ABNORMAL HIGH (ref 70–99)
Glucose-Capillary: 227 mg/dL — ABNORMAL HIGH (ref 70–99)
Glucose-Capillary: 228 mg/dL — ABNORMAL HIGH (ref 70–99)
Glucose-Capillary: 229 mg/dL — ABNORMAL HIGH (ref 70–99)
Glucose-Capillary: 246 mg/dL — ABNORMAL HIGH (ref 70–99)

## 2018-03-23 LAB — CBC WITH DIFFERENTIAL/PLATELET
Abs Immature Granulocytes: 0.06 10*3/uL (ref 0.00–0.07)
Basophils Absolute: 0 10*3/uL (ref 0.0–0.1)
Basophils Relative: 0 %
EOS ABS: 0 10*3/uL (ref 0.0–0.5)
Eosinophils Relative: 0 %
HCT: 43.8 % (ref 39.0–52.0)
Hemoglobin: 14.1 g/dL (ref 13.0–17.0)
Immature Granulocytes: 1 %
Lymphocytes Relative: 6 %
Lymphs Abs: 0.8 10*3/uL (ref 0.7–4.0)
MCH: 32.5 pg (ref 26.0–34.0)
MCHC: 32.2 g/dL (ref 30.0–36.0)
MCV: 100.9 fL — ABNORMAL HIGH (ref 80.0–100.0)
Monocytes Absolute: 1.3 10*3/uL — ABNORMAL HIGH (ref 0.1–1.0)
Monocytes Relative: 11 %
NRBC: 0 % (ref 0.0–0.2)
Neutro Abs: 10 10*3/uL — ABNORMAL HIGH (ref 1.7–7.7)
Neutrophils Relative %: 82 %
Platelets: 50 10*3/uL — ABNORMAL LOW (ref 150–400)
RBC: 4.34 MIL/uL (ref 4.22–5.81)
RDW: 13.2 % (ref 11.5–15.5)
WBC: 12.2 10*3/uL — ABNORMAL HIGH (ref 4.0–10.5)

## 2018-03-23 LAB — RENAL FUNCTION PANEL
ANION GAP: 7 (ref 5–15)
Albumin: 2.3 g/dL — ABNORMAL LOW (ref 3.5–5.0)
Albumin: 1.8 g/dL — ABNORMAL LOW (ref 3.5–5.0)
Anion gap: 9 (ref 5–15)
BUN: 31 mg/dL — ABNORMAL HIGH (ref 6–20)
BUN: 32 mg/dL — ABNORMAL HIGH (ref 6–20)
CALCIUM: 6.9 mg/dL — AB (ref 8.9–10.3)
CO2: 23 mmol/L (ref 22–32)
CO2: 20 mmol/L — ABNORMAL LOW (ref 22–32)
Calcium: 6.7 mg/dL — ABNORMAL LOW (ref 8.9–10.3)
Chloride: 107 mmol/L (ref 98–111)
Chloride: 107 mmol/L (ref 98–111)
Creatinine, Ser: 2.37 mg/dL — ABNORMAL HIGH (ref 0.61–1.24)
Creatinine, Ser: 1.8 mg/dL — ABNORMAL HIGH (ref 0.61–1.24)
GFR calc Af Amer: 36 mL/min — ABNORMAL LOW (ref >60)
GFR calc Af Amer: 50 mL/min — ABNORMAL LOW (ref >60)
GFR calc non Af Amer: 43 mL/min — ABNORMAL LOW (ref >60)
GFR, EST NON AFRICAN AMERICAN: 31 mL/min — AB (ref >60)
Glucose, Bld: 175 mg/dL — ABNORMAL HIGH (ref 70–99)
Glucose, Bld: 161 mg/dL — ABNORMAL HIGH (ref 70–99)
POTASSIUM: 3.8 mmol/L (ref 3.5–5.1)
Phosphorus: 3.3 mg/dL (ref 2.5–4.6)
Phosphorus: 3.4 mg/dL (ref 2.5–4.6)
Potassium: 3.3 mmol/L — ABNORMAL LOW (ref 3.5–5.1)
Sodium: 137 mmol/L (ref 135–145)
Sodium: 136 mmol/L (ref 135–145)

## 2018-03-23 LAB — COMPREHENSIVE METABOLIC PANEL
ALK PHOS: 65 U/L (ref 38–126)
ALT: 37 U/L (ref 0–44)
AST: 50 U/L — ABNORMAL HIGH (ref 15–41)
Albumin: 1.9 g/dL — ABNORMAL LOW (ref 3.5–5.0)
Anion gap: 13 (ref 5–15)
BUN: 37 mg/dL — ABNORMAL HIGH (ref 6–20)
CO2: 19 mmol/L — ABNORMAL LOW (ref 22–32)
Calcium: 6.5 mg/dL — ABNORMAL LOW (ref 8.9–10.3)
Chloride: 104 mmol/L (ref 98–111)
Creatinine, Ser: 2.84 mg/dL — ABNORMAL HIGH (ref 0.61–1.24)
GFR, EST AFRICAN AMERICAN: 29 mL/min — AB (ref >60)
GFR, EST NON AFRICAN AMERICAN: 25 mL/min — AB (ref >60)
Glucose, Bld: 246 mg/dL — ABNORMAL HIGH (ref 70–99)
Potassium: 3.9 mmol/L (ref 3.5–5.1)
Sodium: 136 mmol/L (ref 135–145)
Total Bilirubin: 1.2 mg/dL (ref 0.3–1.2)
Total Protein: 5.2 g/dL — ABNORMAL LOW (ref 6.5–8.1)

## 2018-03-23 LAB — TRIGLYCERIDES: Triglycerides: 681 mg/dL — ABNORMAL HIGH (ref <150)

## 2018-03-23 LAB — POCT I-STAT 3, ART BLOOD GAS (G3+)
Acid-base deficit: 7 mmol/L — ABNORMAL HIGH (ref 0.0–2.0)
Acid-base deficit: 8 mmol/L — ABNORMAL HIGH (ref 0.0–2.0)
BICARBONATE: 19.4 mmol/L — AB (ref 20.0–28.0)
Bicarbonate: 19.1 mmol/L — ABNORMAL LOW (ref 20.0–28.0)
O2 Saturation: 89 %
O2 Saturation: 89 %
Patient temperature: 37.3
TCO2: 21 mmol/L — ABNORMAL LOW (ref 22–32)
TCO2: 20 mmol/L — ABNORMAL LOW (ref 22–32)
pCO2 arterial: 42.5 mmHg (ref 32.0–48.0)
pCO2 arterial: 40.7 mmHg (ref 32.0–48.0)
pH, Arterial: 7.269 — ABNORMAL LOW (ref 7.350–7.450)
pH, Arterial: 7.266 — ABNORMAL LOW (ref 7.350–7.450)
pO2, Arterial: 65 mmHg — ABNORMAL LOW (ref 83.0–108.0)
pO2, Arterial: 57 mmHg — ABNORMAL LOW (ref 83.0–108.0)

## 2018-03-23 LAB — LIPASE, BLOOD: Lipase: 152 U/L — ABNORMAL HIGH (ref 11–51)

## 2018-03-23 LAB — MAGNESIUM: Magnesium: 2.1 mg/dL (ref 1.7–2.4)

## 2018-03-23 LAB — LACTIC ACID, PLASMA: Lactic Acid, Venous: 1.9 mmol/L (ref 0.5–1.9)

## 2018-03-23 LAB — PHOSPHORUS: PHOSPHORUS: 3.3 mg/dL (ref 2.5–4.6)

## 2018-03-23 MED ORDER — INSULIN DETEMIR 100 UNIT/ML ~~LOC~~ SOLN
10.0000 [IU] | Freq: Two times a day (BID) | SUBCUTANEOUS | Status: DC
  Administered 2018-03-23 – 2018-03-24 (×2): 10 [IU] via SUBCUTANEOUS
  Filled 2018-03-23 (×3): qty 0.1

## 2018-03-23 MED ORDER — NOREPINEPHRINE BITARTRATE 1 MG/ML IV SOLN
0.0000 ug/min | INTRAVENOUS | Status: DC
  Administered 2018-03-23: 8 ug/min via INTRAVENOUS
  Administered 2018-03-23: 2 ug/min via INTRAVENOUS
  Filled 2018-03-23 (×3): qty 4

## 2018-03-23 MED ORDER — CHLORHEXIDINE GLUCONATE 0.12% ORAL RINSE (MEDLINE KIT)
15.0000 mL | Freq: Two times a day (BID) | OROMUCOSAL | Status: DC
  Administered 2018-03-23 – 2018-03-26 (×7): 15 mL via OROMUCOSAL

## 2018-03-23 MED ORDER — DEXMEDETOMIDINE HCL IN NACL 200 MCG/50ML IV SOLN: 0.0000 ug/kg/h | INTRAVENOUS | Status: DC

## 2018-03-23 MED ORDER — FENTANYL 2500MCG IN NS 250ML (10MCG/ML) PREMIX INFUSION
25.0000 ug/h | INTRAVENOUS | Status: DC
  Administered 2018-03-23: 300 ug/h via INTRAVENOUS
  Administered 2018-03-23: 400 ug/h via INTRAVENOUS
  Administered 2018-03-23 – 2018-03-24 (×2): 300 ug/h via INTRAVENOUS
  Administered 2018-03-24: 350 ug/h via INTRAVENOUS
  Administered 2018-03-24: 300 ug/h via INTRAVENOUS
  Administered 2018-03-25 (×2): 400 ug/h via INTRAVENOUS
  Administered 2018-03-25: 350 ug/h via INTRAVENOUS
  Administered 2018-03-25 – 2018-03-26 (×3): 400 ug/h via INTRAVENOUS
  Filled 2018-03-23 (×12): qty 250

## 2018-03-23 MED ORDER — THIAMINE HCL 100 MG/ML IJ SOLN
100.0000 mg | Freq: Every day | INTRAMUSCULAR | Status: DC
  Administered 2018-03-23 – 2018-03-25 (×3): 100 mg via INTRAVENOUS
  Filled 2018-03-23 (×3): qty 2

## 2018-03-23 MED ORDER — VITAL HIGH PROTEIN PO LIQD
1000.0000 mL | ORAL | Status: DC
  Administered 2018-03-23 – 2018-03-25 (×3): 1000 mL
  Filled 2018-03-23 (×2): qty 1000

## 2018-03-23 MED ORDER — HEPARIN SODIUM (PORCINE) 1000 UNIT/ML DIALYSIS
1000.0000 [IU] | INTRAMUSCULAR | Status: DC | PRN
  Administered 2018-03-25: 3000 [IU] via INTRAVENOUS_CENTRAL
  Filled 2018-03-23: qty 3
  Filled 2018-03-23: qty 6

## 2018-03-23 MED ORDER — HYDROCORTISONE NA SUCCINATE PF 100 MG IJ SOLR
50.0000 mg | Freq: Four times a day (QID) | INTRAMUSCULAR | Status: DC
  Administered 2018-03-23 – 2018-03-24 (×7): 50 mg via INTRAVENOUS
  Filled 2018-03-23 (×7): qty 2

## 2018-03-23 MED ORDER — INSULIN REGULAR(HUMAN) IN NACL 100-0.9 UT/100ML-% IV SOLN: INTRAVENOUS | Status: DC

## 2018-03-23 MED ORDER — METRONIDAZOLE IN NACL 5-0.79 MG/ML-% IV SOLN
500.0000 mg | Freq: Three times a day (TID) | INTRAVENOUS | Status: DC
  Administered 2018-03-23 – 2018-03-24 (×5): 500 mg via INTRAVENOUS
  Filled 2018-03-23 (×5): qty 100

## 2018-03-23 MED ORDER — PRISMASOL BGK 4/2.5 32-4-2.5 MEQ/L IV SOLN
INTRAVENOUS | Status: DC
  Administered 2018-03-23 – 2018-03-25 (×18): via INTRAVENOUS_CENTRAL
  Filled 2018-03-23 (×14): qty 5000

## 2018-03-23 MED ORDER — PRO-STAT SUGAR FREE PO LIQD
60.0000 mL | Freq: Two times a day (BID) | ORAL | Status: DC
  Administered 2018-03-23 – 2018-03-26 (×6): 60 mL
  Filled 2018-03-23 (×6): qty 60

## 2018-03-23 MED ORDER — VITAL HIGH PROTEIN PO LIQD: 1000.0000 mL | ORAL | Status: DC

## 2018-03-23 MED ORDER — PRISMASOL BGK 4/2.5 32-4-2.5 MEQ/L REPLACEMENT SOLN
Status: DC
  Administered 2018-03-23 – 2018-03-25 (×6): via INTRAVENOUS_CENTRAL
  Filled 2018-03-23 (×12): qty 5000

## 2018-03-23 MED ORDER — VASOPRESSIN 20 UNIT/ML IV SOLN
0.0300 [IU]/min | INTRAVENOUS | Status: DC
  Administered 2018-03-23: 0.03 [IU]/min via INTRAVENOUS
  Filled 2018-03-23: qty 2

## 2018-03-23 MED ORDER — SODIUM CHLORIDE 0.9 % IV SOLN
1.0000 g | Freq: Two times a day (BID) | INTRAVENOUS | Status: DC
  Administered 2018-03-23: 1 g via INTRAVENOUS
  Filled 2018-03-23: qty 1

## 2018-03-23 MED ORDER — PROPOFOL 1000 MG/100ML IV EMUL
0.0000 ug/kg/min | INTRAVENOUS | Status: DC
  Administered 2018-03-23: 10 ug/kg/min via INTRAVENOUS
  Filled 2018-03-23: qty 100

## 2018-03-23 MED ORDER — FOLIC ACID 5 MG/ML IJ SOLN
1.0000 mg | Freq: Every day | INTRAMUSCULAR | Status: DC
  Administered 2018-03-23 – 2018-03-25 (×3): 1 mg via INTRAVENOUS
  Filled 2018-03-23 (×4): qty 0.2

## 2018-03-23 MED ORDER — SODIUM CHLORIDE 0.9 % IV SOLN
INTRAVENOUS | Status: DC | PRN
  Administered 2018-03-23: 500 mL via INTRAVENOUS

## 2018-03-23 MED ORDER — INSULIN ASPART 100 UNIT/ML ~~LOC~~ SOLN
3.0000 [IU] | SUBCUTANEOUS | Status: DC
  Administered 2018-03-23 (×2): 6 [IU] via SUBCUTANEOUS
  Administered 2018-03-24: 9 [IU] via SUBCUTANEOUS
  Administered 2018-03-24: 6 [IU] via SUBCUTANEOUS
  Administered 2018-03-24 (×2): 9 [IU] via SUBCUTANEOUS

## 2018-03-23 MED ORDER — PRISMASOL BGK 4/2.5 32-4-2.5 MEQ/L REPLACEMENT SOLN
Status: DC
  Administered 2018-03-23 – 2018-03-25 (×6): via INTRAVENOUS_CENTRAL
  Filled 2018-03-23 (×12): qty 5000

## 2018-03-23 MED ORDER — ORAL CARE MOUTH RINSE
15.0000 mL | OROMUCOSAL | Status: DC
  Administered 2018-03-23 – 2018-03-26 (×36): 15 mL via OROMUCOSAL

## 2018-03-23 MED ORDER — FENTANYL BOLUS VIA INFUSION
50.0000 ug | INTRAVENOUS | Status: DC | PRN
  Administered 2018-03-23 – 2018-03-24 (×2): 50 ug via INTRAVENOUS
  Filled 2018-03-23: qty 50

## 2018-03-23 MED ORDER — DEXTROSE 10 % IV SOLN: INTRAVENOUS | Status: DC

## 2018-03-23 MED ORDER — FENTANYL CITRATE (PF) 100 MCG/2ML IJ SOLN: 50.0000 ug | Freq: Once | INTRAMUSCULAR | Status: DC

## 2018-03-23 MED ORDER — HEPARIN SODIUM (PORCINE) 5000 UNIT/ML IJ SOLN
5000.0000 [IU] | Freq: Three times a day (TID) | INTRAMUSCULAR | Status: DC
  Administered 2018-03-23 – 2018-03-30 (×22): 5000 [IU] via SUBCUTANEOUS
  Filled 2018-03-23 (×22): qty 1

## 2018-03-23 MED ORDER — SODIUM CHLORIDE 0.9 % IV SOLN
2.0000 g | Freq: Two times a day (BID) | INTRAVENOUS | Status: DC
  Administered 2018-03-23 – 2018-03-24 (×2): 2 g via INTRAVENOUS
  Filled 2018-03-23 (×3): qty 2

## 2018-03-23 MED ORDER — INSULIN ASPART 100 UNIT/ML ~~LOC~~ SOLN
3.0000 [IU] | SUBCUTANEOUS | Status: DC
  Administered 2018-03-23 – 2018-03-24 (×6): 3 [IU] via SUBCUTANEOUS

## 2018-03-23 MED ORDER — PHENYLEPHRINE HCL-NACL 10-0.9 MG/250ML-% IV SOLN: 0.0000 ug/min | INTRAVENOUS | Status: DC

## 2018-03-23 MED ORDER — DEXMEDETOMIDINE HCL IN NACL 400 MCG/100ML IV SOLN
0.0000 ug/kg/h | INTRAVENOUS | Status: DC
  Administered 2018-03-23 (×2): 0.4 ug/kg/h via INTRAVENOUS
  Administered 2018-03-23: 0.6 ug/kg/h via INTRAVENOUS
  Administered 2018-03-24: 0.5 ug/kg/h via INTRAVENOUS
  Administered 2018-03-24: 0.9 ug/kg/h via INTRAVENOUS
  Administered 2018-03-24: 1 ug/kg/h via INTRAVENOUS
  Administered 2018-03-24 – 2018-03-25 (×9): 1.2 ug/kg/h via INTRAVENOUS
  Administered 2018-03-25: 1.1 ug/kg/h via INTRAVENOUS
  Administered 2018-03-26 (×3): 1.2 ug/kg/h via INTRAVENOUS
  Administered 2018-03-26: 1 ug/kg/h via INTRAVENOUS
  Administered 2018-03-26 – 2018-03-27 (×5): 1.2 ug/kg/h via INTRAVENOUS
  Administered 2018-03-27: 1 ug/kg/h via INTRAVENOUS
  Administered 2018-03-27: 0.7 ug/kg/h via INTRAVENOUS
  Filled 2018-03-23 (×19): qty 100
  Filled 2018-03-23: qty 200
  Filled 2018-03-23 (×6): qty 100

## 2018-03-23 MED ORDER — VITAMIN B-1 100 MG PO TABS: 100.0000 mg | ORAL_TABLET | Freq: Every day | ORAL | Status: DC

## 2018-03-23 MED ORDER — SODIUM CHLORIDE 0.9 % FOR CRRT: INTRAVENOUS_CENTRAL | Status: DC | PRN

## 2018-03-23 MED ORDER — PRO-STAT SUGAR FREE PO LIQD: 30.0000 mL | Freq: Two times a day (BID) | ORAL | Status: DC

## 2018-03-23 MED ORDER — PANTOPRAZOLE SODIUM 40 MG IV SOLR
40.0000 mg | INTRAVENOUS | Status: DC
  Administered 2018-03-23 – 2018-03-26 (×4): 40 mg via INTRAVENOUS
  Filled 2018-03-23 (×4): qty 40

## 2018-03-23 MED ORDER — FOLIC ACID 1 MG PO TABS: 1.0000 mg | ORAL_TABLET | Freq: Every day | ORAL | Status: DC

## 2018-03-23 MED ORDER — HEPARIN SODIUM (PORCINE) 1000 UNIT/ML DIALYSIS
1000.0000 [IU] | INTRAMUSCULAR | Status: DC | PRN
  Administered 2018-03-23: 2800 [IU] via INTRAVENOUS_CENTRAL
  Filled 2018-03-23 (×2): qty 6

## 2018-03-23 NOTE — Progress Notes (Signed)
Pressure bag set up for RN for abdominal pressures.

## 2018-03-23 NOTE — Progress Notes (Signed)
[facility] AM Follow Up    Brief Summary: 53 y/o M admitted 1/4 with DKA, alcoholic pancreatitis, multiorgan failure.     S: RN reports pt remains on propofol, fentanyl gtts.  Levophed at , vasopressin.  Monitoring BP from [place].    O: Blood pressure 108/71, pulse 96, temperature 99 F (37.2 C), resp. rate (!) 26, height 5\' 9"  (1.753 m), weight 116.8 kg, SpO2 94 %.  General: critically ill appearing adult male in NAD on vent   HEENT: MM pink/moist, ETT Neuro: sedate  CV: s1s2 rrr, no m/r/g PULM: even/non-labored, lungs bilaterally clear  GI: protuberant, soft, decreased BS Extremities: warm/dry, trace generalized edema  Skin: no rashes or lesions  CT ABD/Pelvis 1/3 >> pancreatitis, concern for pseudocyst, diffuse mesenteric edema > concern for worsening pancreatitis, small bilateral pleural effusions  Recent Labs  Lab 03/22/18 0025 03/22/18 0347 03/23/18 0509  HGB 17.5* 16.8 14.1  HCT 51.8 49.7 43.8  WBC 10.4 14.8* 12.2*  PLT 57* 71* 50*   Recent Labs  Lab 03/22/18 1003 03/22/18 1320 03/22/18 1727 03/22/18 2246 03/23/18 0509  NA 139 138 138 137 136  K 4.1 3.9 3.6 3.3* 3.9  CL 108 107 111 107 104  CO2 21* 22 18* 23 19*  GLUCOSE 81 158* 168* 175* 246*  BUN 38* 35* 34* 31* 37*  CREATININE 4.27* 3.56* 3.00* 2.37* 2.84*  CALCIUM 6.3* 6.6* 5.9* 6.9* 6.5*  MG 1.6* 1.4* 1.8 1.8  --   PHOS 2.5 2.9 2.9 3.3  --      A: Acute Pancreatitis - suspect ETOH induced Septic Shock  ARDS, Acute Hypoxemic Respiratory Failure  DM  AKI  Suspected ETOH Withdrawal   P: See H&P from 0300 for full details  Discontinue propofol with elevated triglycerides  Begin precedex for sedation in the setting of possible ETOH withdrawal  Continue fentanyl gtt  Continue CVVHD Follow BMP, CBC Wean vasopressors for MAP>65  Cefepime + flagyl (empiric, abd coverage) Change meds to IV  Add trickle feeding    Additional CC Time: 30 minutes   Canary Brim, NP-C [facility] Pgr: [phone] or if no answer [phone] 03/23/2018, 9:55 AM

## 2018-03-23 NOTE — Progress Notes (Signed)
Sputum sample obtained and sent to lab by RT. 

## 2018-03-23 NOTE — Procedures (Signed)
Arterial Catheter Insertion Procedure Note Randy MeckelKevin W Deleon [id number] February 17, 1966  Procedure: Insertion of Arterial Catheter  Indications: Blood pressure monitoring and Frequent blood sampling  Procedure Details Consent: Unable to obtain consent because of altered level of consciousness. Time Out: Verified patient identification, verified procedure, site/side was marked, verified correct patient position, special equipment/implants available, medications/allergies/relevent history reviewed, required imaging and test results available.  Performed  Maximum sterile technique was used including antiseptics, cap, gloves, gown, hand hygiene, mask and sheet. Skin prep: Chlorhexidine; local anesthetic administered 20 gauge catheter was inserted into right radial artery using the Seldinger technique. ULTRASOUND GUIDANCE USED: NO Evaluation Blood flow good; BP tracing good. Complications: No apparent complications.   Randy Deleon, Randy Deleon 03/23/2018

## 2018-03-23 NOTE — Consult Note (Signed)
Randy Deleon Admit Date: 03/23/2018 03/23/2018 Randy Deleon Requesting Physician:  Dellie Catholic MD  Reason for Consult:  AKI HPI:  53 year old male transferred overnight from [facility] for pancreatitis with septic shock and renal failure.  Patient originally presented on 1/1 with abdominal pain, nausea, vomiting.  His lipase was elevated at 763.  He had normal renal function at that time.  He had an increased anion gap of 21.  He had a CT of the abdomen with contrast on 1/1.  Findings included edema and fluid surrounding the pancreas consistent with pancreatitis.  Kidneys are described as without hydronephrosis.  During patient's stay at the outside facility he decompensated, developing shock requiring vasopressors.  Developed respiratory failure requiring intubation.  Patient had progressive renal failure and started on CRRT on 1/3.  There was concern for abdominal compartment syndrome but transduce bladder pressure was 12 at the outside facility and 15 upon arrival here.  He has been seen by general surgery who did not feel that compartment syndrome was currently present.  Labs at time of transfer included a potassium of 3.3, BUN 31, creatinine 2.37, calcium 6.9, bicarbonate 23, hemoglobin 14.1, white blood cell count 12.2.  His peak creatinine was 4.27 yesterday.  Urine analysis during presentation included 2+ protein, no hematuria, no pyuria.  Since arrival proximally 5 hours ago he is made 0.3 L urine.  PMH Incudes:  DM2 on Invokana, sulfonylurea, metformin  Hypertension on carvedilol  Alcohol use   Creatinine, Ser (mg/dL)  Date Value  03/22/2018 2.37 (H)  03/22/2018 3.00 (H)  03/22/2018 3.56 (H)  03/22/2018 4.27 (H)  03/22/2018 4.11 (H)  03/22/2018 3.86 (H)  03/22/2018 3.45 (H)  03/21/2018 3.19 (H)  03/21/2018 2.40 (H)  03/21/2018 1.81 (H)  ] I/Os:  ROS Balance of 12 systems is negative w/ exceptions as above  PMH  Past Medical History:  Diagnosis  Date  . Diabetes mellitus without complication ([place])   . Hypertension    PSH  Past Surgical History:  Procedure Laterality Date  . none     FH  Family History  Problem Relation Age of Onset  . Diabetes Mother   . Hypertension Mother   . Congestive Heart Failure Father   . Diabetes Father   . Hypertension Father    SH  reports that he has been smoking cigarettes. He started smoking about 21 years ago. He has a 15.00 pack-year smoking history. He has never used smokeless tobacco. He reports previous alcohol use. He reports previous drug use. Allergies  Allergies  Allergen Reactions  . Other     Lima beans  Per testing.  . Shellfish Allergy      Cannot recall reaction,Per your allergy testing showed that a allergy to shellfish.    Home medications Prior to Admission medications   Medication Sig Start Date End Date Taking? Authorizing Provider  ceFEPIme 1 g in sodium chloride 0.9 % 100 mL Inject 1 g into the vein every 8 (eight) hours. 03/23/18   Flora Lipps, MD  chlorhexidine gluconate, MEDLINE KIT, (PERIDEX) 0.12 % solution 15 mLs by Mouth Rinse route 2 (two) times daily. 03/23/18   Flora Lipps, MD  dextrose 10 % infusion Inject 50 mL/hr into the vein continuous. 03/22/18   Flora Lipps, MD  hydrocortisone sodium succinate (SOLU-CORTEF) 100 MG SOLR injection Inject 1 mL (50 mg total) into the vein every 6 (six) hours. 03/23/18   Flora Lipps, MD  ipratropium-albuterol (DUONEB) 0.5-2.5 (3) MG/3ML SOLN Take 3  mLs by nebulization every 6 (six) hours as needed. 03/22/18   Flora Lipps, MD  metoprolol tartrate (LOPRESSOR) 5 MG/5ML SOLN injection Inject 5 mLs (5 mg total) into the vein every 8 (eight) hours. 03/22/18   Flora Lipps, MD  metoprolol tartrate (LOPRESSOR) 5 MG/5ML SOLN injection Inject 2.5-5 mLs (2.5-5 mg total) into the vein every 6 (six) hours as needed (hr >120). 03/22/18   Flora Lipps, MD  metroNIDAZOLE (FLAGYL) 5-0.79 MG/ML-% IVPB Inject 100 mLs (500 mg total) into the vein every  8 (eight) hours. 03/23/18   Flora Lipps, MD  midazolam (VERSED) 2 MG/2ML SOLN injection Inject 2 mLs (2 mg total) into the vein every 15 (fifteen) minutes as needed (to acheive RASS goal.). 03/22/18   Flora Lipps, MD  midazolam (VERSED) 2 MG/2ML SOLN injection Inject 2 mLs (2 mg total) into the vein every 2 (two) hours as needed (to maintain RASS goal.). 03/22/18   Flora Lipps, MD  midazolam 50 mg in sodium chloride 0.9 % 40 mL Inject 0.5-5 mg/hr into the vein continuous. 03/22/18   Flora Lipps, MD  nicotine (NICODERM CQ - DOSED IN MG/24 HOURS) 21 mg/24hr patch Place 1 patch (21 mg total) onto the skin daily. 03/23/18   Flora Lipps, MD  ondansetron (ZOFRAN) 4 MG tablet Take 1 tablet (4 mg total) by mouth every 6 (six) hours as needed for nausea. 03/22/18   Flora Lipps, MD  ondansetron (ZOFRAN) 4 MG/2ML SOLN injection Inject 2 mLs (4 mg total) into the vein every 6 (six) hours as needed for nausea. 03/22/18   Flora Lipps, MD  pantoprazole (PROTONIX) 40 MG injection Inject 40 mg into the vein every 12 (twelve) hours. 03/23/18   Flora Lipps, MD  phenylephrine 40 mg in sodium chloride 0.9 % 250 mL Inject 0-400 mcg/min into the vein continuous. 03/22/18   Flora Lipps, MD  thiamine (B-1) 100 MG/ML injection Inject 1 mL (100 mg total) into the vein daily. 03/23/18   Flora Lipps, MD  thiamine 100 MG tablet Take 1 tablet (100 mg total) by mouth daily. 03/23/18   Flora Lipps, MD  vasopressin 40 Units in sodium chloride 0.9 % 250 mL Inject 0.03 Units/min into the vein continuous. 03/22/18   Flora Lipps, MD    Current Medications Scheduled Meds: . chlorhexidine gluconate (MEDLINE KIT)  15 mL Mouth Rinse BID  . fentaNYL (SUBLIMAZE) injection  50 mcg Intravenous Once  . folic acid  1 mg Oral Daily  . heparin injection (subcutaneous)  5,000 Units Subcutaneous Q8H  . hydrocortisone sod succinate (SOLU-CORTEF) inj  50 mg Intravenous Q6H  . mouth rinse  15 mL Mouth Rinse 10 times per day  . pantoprazole (PROTONIX) IV  40 mg  Intravenous Q24H  . thiamine  100 mg Oral Daily   Continuous Infusions: . sodium chloride    . ceFEPime (MAXIPIME) IV    . fentaNYL infusion INTRAVENOUS 400 mcg/hr (03/23/18 0345)  . metronidazole    . norepinephrine (LEVOPHED) Adult infusion 8 mcg/min (03/23/18 0427)  . propofol (DIPRIVAN) infusion 10 mcg/kg/min (03/23/18 2595)  . vasopressin (PITRESSIN) infusion - *FOR SHOCK* 0.03 Units/min (03/23/18 0402)   PRN Meds:.sodium chloride, fentaNYL, heparin  CBC Recent Labs  Lab 03/20/18 1350  03/22/18 0025 03/22/18 0347 03/23/18 0509  WBC 15.2*   < > 10.4 14.8* 12.2*  NEUTROABS 13.3*  --   --   --  PENDING  HGB 19.4*   < > 17.5* 16.8 14.1  HCT 56.6*   < > 51.8  49.7 43.8  MCV 98.6   < > 98.5 99.2 100.9*  PLT 112*   < > 57* 71* PENDING   < > = values in this interval not displayed.   Basic Metabolic Panel Recent Labs  Lab 03/22/18 0025 03/22/18 0347 03/22/18 0821 03/22/18 1003 03/22/18 1320 03/22/18 1727 03/22/18 2246  NA 138 139 139 139 138 138 137  K 5.0 4.5 3.9 4.1 3.9 3.6 3.3*  CL 108 107 110 108 107 111 107  CO2 21* 22 21* 21* 22 18* 23  GLUCOSE 88 80 72 81 158* 168* 175*  BUN 33* 35* 36* 38* 35* 34* 31*  CREATININE 3.45* 3.86* 4.11* 4.27* 3.56* 3.00* 2.37*  CALCIUM 6.6* 6.8* 6.3* 6.3* 6.6* 5.9* 6.9*  PHOS  --   --   --  2.5 2.9 2.9 3.3    Physical Exam  Blood pressure 127/75, pulse (!) 110, temperature 99.3 F (37.4 C), resp. rate (!) 26, height '5\' 9"'$  (1.753 m), weight 116.8 kg, SpO2 93 %. GEN: Intubated, sedated ENT: ET tube in place EYES: Eyes closed CV: Tachycardic, normal S1-S2, regular, no rub PULM: Coarse breath sounds bilaterally ABD: Distended abdomen, not tense SKIN: No rashes or lesions EXT: Trace to 1+ edema in the extremities L Femoral Temp HD cath  Assessment 53 year old male with AKI from normal baseline GFR secondary to contrast exposure and septic shock related to pancreatitis.  1. AKI likely ATN from Contrast 1/1 and shock; started  CRRT 1/3 at OSH 2. Pancreatitis 3. Septic Shock on NE/VP; Cefepime/Flagyl; per TRH 4. DM2 5. VDRF / Hypoxia / ARDS  Plan 1. Will resume CRRT: all 4K, 0-23m/hr net neg, no heparin,  2. Daily weights, Daily Renal Panel, Strict I/Os, Avoid nephrotoxins (NSAIDs, judicious IV Contrast) 3. Will follow closely   RPearson GrippeMD [phone] 03/23/2018, 6:30 AM

## 2018-03-23 NOTE — H&P (Signed)
NAME:  Randy Deleon, MRN:  [id number], DOB:  [birth year], LOS: 0 ADMISSION DATE:  03/23/2018, CONSULTATION DATE:  03/23/18 REFERRING MD:  Flora Lipps, MD CHIEF COMPLAINT:  Abdominal pain   Brief History   Alcoholic pancreatitis with multiorgan failure, concern for abdominal compartment syndrome  History of present illness   53 year old male history of diabetes mellitus, alcohol abuse, who presented to [facility] with complaints of nausea, vomiting and abdominal pain.  There he was diagnosed with acute pancreatitis based on a CT of the abdomen.  He was also treated for diabetic ketoacidosis based on elevated blood glucose and anion gap.  He was initially admitted to the floor however he worsened requiring transfer to the ICU, intubation, 2 vasopressors and maximum ventilator support.  He developed renal failure requiring initiation of CRRT for which general surgery was consulted for possible abdominal compartment syndrome.  They evaluated him and noted that his abdominal pressure was 12 cmH2O and did not believe that he had abdominal compartment syndrome at that time however were concerned should he deteriorate that he should be at a higher level of care for which she was transferred to the [facility] MICU.    Past Medical History   Past Medical History:  Diagnosis Date  . Diabetes mellitus without complication ([place])   . Hypertension      Significant Hospital Events   03/20/18 admitted to [facility] 03/21/18 endotracheal intubation 03/23/18 transferred to [facility] cone  Consults:  General surgery nephrology  Procedures:  03/22/18 trialysis catheter placement  Significant Diagnostic Tests:  CTA/P 03/20/18: IMPRESSION: 1. Marked edema and fluid surrounding the pancreas, consistent with acute pancreatitis. No organized fluid collections or evidence for necrosis at this time. 2. Anterior wall thickening of the second and third portion of duodenum, likely reactive/due to  inflammatory changes in the pancreas. 3. Hepatic steatosis   CTA/P 03/22/18 : 1. Pancreatitis again noted. Increased fluid tracking about the greater curvature of the stomach, raising concern for evolving pseudocyst. Diffuse mesenteric edema tracks inferiorly, with fluid tracking along the paracolic gutters bilaterally into the pelvis. The amount of fluid has increased since the prior study, concerning for worsening pancreatitis. Evaluation for devascularization is markedly limited without contrast. 2. Small bilateral pleural effusions. Partial consolidation of both lower lung lobes likely reflects atelectasis. 3. Diffuse coronary artery calcifications seen.    Micro Data:  1/4 blood culture>> 1/4 trachea aspirate>>  Antimicrobials:  Cefepime 03/21/18>> Flagyl 03/21/18>> Zosyn 03/21/18>>03/22/18    Objective   Blood pressure 118/74, pulse 80, temperature 99 F (37.2 C), resp. rate (!) 24, SpO2 90 %.    Vent Mode: PRVC FiO2 (%):  [100 %] 100 % Set Rate:  [24 bmp] 24 bmp Vt Set:  [500 mL] 500 mL PEEP:  [5 cmH20-10 cmH20] 10 cmH20 Plateau Pressure:  [17 cmH20-29 cmH20] 29 cmH20   Intake/Output Summary (Last 24 hours) at 03/23/2018 6387 Last data filed at 03/23/2018 0200 Gross per 24 hour  Intake 25 ml  Output -  Net 25 ml   There were no vitals filed for this visit.  Examination: Physical Exam  Constitutional:  Intubated, sedated, restless  HENT:  Head: Normocephalic and atraumatic.  Eyes: Pupils are equal, round, and reactive to light. EOM are normal. Right eye exhibits no discharge. Left eye exhibits no discharge. No scleral icterus.  Cardiovascular: Normal rate.  No murmur heard. Pulmonary/Chest: Effort normal and breath sounds normal. No stridor. No respiratory distress.  Abdominal: Soft. He exhibits distension. There  is no abdominal tenderness.  Not taught or firm  Musculoskeletal:        General: No deformity or edema.  Neurological: No cranial nerve deficit.    Sedated. Briefly opens eyes to suctioning, otherwise does not withdraw to pain.  Skin: Skin is warm and dry.  Psychiatric: Affect normal.      Assessment & Plan:  53 year old male history of diabetes mellitus, alcohol abuse, who presented to [facility] with acute pancreatitis which has been complicated by acute respiratory distress syndrome requiring intubation, acute kidney injury requiring CRRT.  Transferred to [facility] in preparation for possible exploratory laparotomy in case he develops abdominal compartment syndrome.  Acute Pancreatitis suspected alcohol induced -Bowel rest -further IVF resuscitation contraindicated with severe ARDS and concern for abdominal compartment syndrome - abdominal pressure 15 when checked 1/4 overnight.  We will continue to check this every 4 hours per surgery's recommendation at [facility] and consult general surgery if increasing - thiamine/folate - sedation with propofol, will possibly need CIWA once extubated or scheduled benzodiazepines if he is able to be weaned from continuous sedation - consider GI consult for developing pseudocyst   Septic Shock - suspect due to infected pancreatic necrosis however other potential sources include lungs given severity of infiltrates - continue empiric cefepime and flagyl, however will check blood/tracheal aspirate cultures here to guide antibiotics -Discontinue Neo-Synephrine, start norepinephrine.  Continue vasopressin - trend lactate  Renal Failure - renal consulted, will need to continue CRRT - renally dose all medications  ARDS, Acute hypoxemic Respiratory Failure requiring intubation -Switch to low tidal volume ventilation.  Will attempt to increase PEEP to more closely follow ARDSNet protocol however he may not tolerate this with his hypotension - ideally would remove aggressive ultrafiltrate with CRRT however he will likely not tolerate much removal with his hemodynamics - continue  fentanyl. Will discontinue versed given risk for worsening encephalopathy and start propofol. Note triglycerides 700's, can monitor and consider discontinuing propofol if these increase into the thousands.  If this is the case then can consider ketamine at that time  Diabetes mellitus - no beta hydroxybutyrate checked on admission and thus it is not clear if the elevated anion gap was due to DKA or lactic acidosis. Regardless will treat with insulin drip protocol for hyperglycemia without DKA and discontinue dextrose drip  Best practice:  Diet: NPO Pain/Anxiety/Delirium protocol (if indicated): fentanyl, propofol VAP protocol (if indicated): ordered DVT prophylaxis: heparin GI prophylaxis: PPI Glucose control: insulin drip IV Mobility: contraindicated Code Status: full  Family Communication: will need to be updated in the morning Disposition:  ICU  Labs   CBC: Recent Labs  Lab 03/20/18 1350 03/20/18 1620 03/20/18 2306 03/21/18 0400 03/22/18 0025 03/22/18 0347  WBC 15.2* 17.4* 9.8 9.6 10.4 14.8*  NEUTROABS 13.3*  --   --   --   --   --   HGB 19.4* 19.7* 18.0* 18.7* 17.5* 16.8  HCT 56.6* 58.9* 52.9* 55.5* 51.8 49.7  MCV 98.6 100.2* 99.1 99.8 98.5 99.2  PLT 112* 124* 84* 94* 57* 71*    Basic Metabolic Panel: Recent Labs  Lab 03/22/18 0821 03/22/18 1003 03/22/18 1320 03/22/18 1727 03/22/18 2246  NA 139 139 138 138 137  K 3.9 4.1 3.9 3.6 3.3*  CL 110 108 107 111 107  CO2 21* 21* 22 18* 23  GLUCOSE 72 81 158* 168* 175*  BUN 36* 38* 35* 34* 31*  CREATININE 4.11* 4.27* 3.56* 3.00* 2.37*  CALCIUM 6.3* 6.3*  6.6* 5.9* 6.9*  MG  --  1.6* 1.4* 1.8 1.8  PHOS  --  2.5 2.9 2.9 3.3   GFR: Estimated Creatinine Clearance: 45.5 mL/min (A) (by C-G formula based on SCr of 2.37 mg/dL (H)). Recent Labs  Lab 03/20/18 2306 03/21/18 0400 03/22/18 0025 03/22/18 0028 03/22/18 0347  PROCALCITON  --   --  24.31  --   --   WBC 9.8 9.6 10.4  --  14.8*  LATICACIDVEN  --   --   --  1.9  2.0*    Liver Function Tests: Recent Labs  Lab 03/20/18 1350 03/20/18 1620 03/21/18 0400 03/22/18 1003 03/22/18 1320 03/22/18 1727 03/22/18 2246  AST 53* 48* 57*  --   --   --   --   ALT 45* 44 34  --   --   --   --   ALKPHOS 75 69 50  --   --   --   --   BILITOT 0.7 1.2 1.4*  --   --   --   --   PROT 7.9 8.1 6.2*  --   --   --   --   ALBUMIN 4.9 4.6 3.4* 2.5* 2.3* 2.2* 2.3*   Recent Labs  Lab 03/20/18 1620 03/22/18 0347  LIPASE 763* 606*   No results for input(s): AMMONIA in the last 168 hours.  ABG    Component Value Date/Time   PHART 7.33 (L) 03/22/2018 0308   PCO2ART 40 03/22/2018 0308   PO2ART 102 03/22/2018 0308   HCO3 22.1 03/22/2018 0308   ACIDBASEDEF 3.3 (H) 03/22/2018 0308   O2SAT 97.1 03/22/2018 0308     Coagulation Profile: No results for input(s): INR, PROTIME in the last 168 hours.  Cardiac Enzymes: Recent Labs  Lab 03/20/18 1715  TROPONINI <0.03    HbA1C: Hgb A1c MFr Bld  Date/Time Value Ref Range Status  11/20/2017 09:09 AM 12.3 (H) 4.8 - 5.6 % Final    Comment:             Prediabetes: 5.7 - 6.4          Diabetes: >6.4          Glycemic control for adults with diabetes: <7.0     CBG: Recent Labs  Lab 03/22/18 1617 03/22/18 1757 03/22/18 1959 03/22/18 2220 03/23/18 0006  GLUCAP 169* 168* 174* 200* 185*    Review of Systems:   Unable to obtain  Past Medical History  He,  has a past medical history of Diabetes mellitus without complication ([place]) and Hypertension.   Surgical History    Past Surgical History:  Procedure Laterality Date  . none       Social History   reports that he has been smoking cigarettes. He started smoking about 21 years ago. He has a 15.00 pack-year smoking history. He has never used smokeless tobacco. He reports previous alcohol use. He reports previous drug use.   Family History   His family history includes Congestive Heart Failure in his father; Diabetes in his father and mother;  Hypertension in his father and mother.   Allergies Allergies  Allergen Reactions  . Other     Lima beans  Per testing.  . Shellfish Allergy      Cannot recall reaction,Per your allergy testing showed that a allergy to shellfish.      Home Medications  Prior to Admission medications   Medication Sig Start Date End Date Taking? Authorizing Provider  ceFEPIme 1 g  in sodium chloride 0.9 % 100 mL Inject 1 g into the vein every 8 (eight) hours. 03/23/18   Flora Lipps, MD  chlorhexidine gluconate, MEDLINE KIT, (PERIDEX) 0.12 % solution 15 mLs by Mouth Rinse route 2 (two) times daily. 03/23/18   Flora Lipps, MD  dextrose 10 % infusion Inject 50 mL/hr into the vein continuous. 03/22/18   Flora Lipps, MD  hydrocortisone sodium succinate (SOLU-CORTEF) 100 MG SOLR injection Inject 1 mL (50 mg total) into the vein every 6 (six) hours. 03/23/18   Flora Lipps, MD  ipratropium-albuterol (DUONEB) 0.5-2.5 (3) MG/3ML SOLN Take 3 mLs by nebulization every 6 (six) hours as needed. 03/22/18   Flora Lipps, MD  metoprolol tartrate (LOPRESSOR) 5 MG/5ML SOLN injection Inject 5 mLs (5 mg total) into the vein every 8 (eight) hours. 03/22/18   Flora Lipps, MD  metoprolol tartrate (LOPRESSOR) 5 MG/5ML SOLN injection Inject 2.5-5 mLs (2.5-5 mg total) into the vein every 6 (six) hours as needed (hr >120). 03/22/18   Flora Lipps, MD  metroNIDAZOLE (FLAGYL) 5-0.79 MG/ML-% IVPB Inject 100 mLs (500 mg total) into the vein every 8 (eight) hours. 03/23/18   Flora Lipps, MD  midazolam (VERSED) 2 MG/2ML SOLN injection Inject 2 mLs (2 mg total) into the vein every 15 (fifteen) minutes as needed (to acheive RASS goal.). 03/22/18   Flora Lipps, MD  midazolam (VERSED) 2 MG/2ML SOLN injection Inject 2 mLs (2 mg total) into the vein every 2 (two) hours as needed (to maintain RASS goal.). 03/22/18   Flora Lipps, MD  midazolam 50 mg in sodium chloride 0.9 % 40 mL Inject 0.5-5 mg/hr into the vein continuous. 03/22/18   Flora Lipps, MD  nicotine  (NICODERM CQ - DOSED IN MG/24 HOURS) 21 mg/24hr patch Place 1 patch (21 mg total) onto the skin daily. 03/23/18   Flora Lipps, MD  ondansetron (ZOFRAN) 4 MG tablet Take 1 tablet (4 mg total) by mouth every 6 (six) hours as needed for nausea. 03/22/18   Flora Lipps, MD  ondansetron (ZOFRAN) 4 MG/2ML SOLN injection Inject 2 mLs (4 mg total) into the vein every 6 (six) hours as needed for nausea. 03/22/18   Flora Lipps, MD  pantoprazole (PROTONIX) 40 MG injection Inject 40 mg into the vein every 12 (twelve) hours. 03/23/18   Flora Lipps, MD  phenylephrine 40 mg in sodium chloride 0.9 % 250 mL Inject 0-400 mcg/min into the vein continuous. 03/22/18   Flora Lipps, MD  thiamine (B-1) 100 MG/ML injection Inject 1 mL (100 mg total) into the vein daily. 03/23/18   Flora Lipps, MD  thiamine 100 MG tablet Take 1 tablet (100 mg total) by mouth daily. 03/23/18   Flora Lipps, MD  vasopressin 40 Units in sodium chloride 0.9 % 250 mL Inject 0.03 Units/min into the vein continuous. 03/22/18   Flora Lipps, MD     Critical care time: 15

## 2018-03-23 NOTE — Progress Notes (Signed)
PHARMACY NOTE:  ANTIMICROBIAL RENAL DOSAGE ADJUSTMENT  Current antimicrobial regimen includes a mismatch between antimicrobial dosage and estimated renal function.  As per policy approved by the Pharmacy & Therapeutics and Medical Executive Committees, the antimicrobial dosage will be adjusted accordingly.  Current antimicrobial dosage:  Cefepime 1 gm IV Q 12 hours  Indication: Septic shock  Renal Function:  Estimated Creatinine Clearance: 39.2 mL/min (A) (by C-G formula based on SCr of 2.84 mg/dL (H)). []      On intermittent HD, scheduled: [x]      On CRRT    Antimicrobial dosage has been changed to:  Cefepime 2 gm IV Q 12 hours while on CRRT   Additional comments:   Thank you for allowing pharmacy to be a part of this patient's care.  Vinnie Level, PharmD., BCPS Clinical Pharmacist Clinical phone for 03/23/18 until 3:30pm: [phone] If after 3:30pm, please refer to [facility] for unit-specific pharmacist

## 2018-03-23 NOTE — Progress Notes (Signed)
Patient received from [facility]. Placed patient on settings from previous facility. PRVC VT 500, RR 24, PEEP 10, and 100% FiO2. Vitals stable at this time. Will await further orders.

## 2018-03-23 NOTE — Progress Notes (Signed)
20 ml of versed gtt wasted in med room hazardous waste jug..witnessed by Lissa Merlin., RN

## 2018-03-23 NOTE — Procedures (Signed)
I was present at this dialysis session. I have reviewed the session itself and made appropriate changes.   CRRT. No heparin TMP stable. All 4K bath. Tol gentle net neg UF.  Cont at current settings.   Filed Weights   03/23/18 0445  Weight: 116.8 kg    Recent Labs  Lab 03/22/18 2246 03/23/18 0509  NA 137 136  K 3.3* 3.9  CL 107 104  CO2 23 19*  GLUCOSE 175* 246*  BUN 31* 37*  CREATININE 2.37* 2.84*  CALCIUM 6.9* 6.5*  PHOS 3.3  --     Recent Labs  Lab 03/20/18 1350  03/22/18 0025 03/22/18 0347 03/23/18 0509  WBC 15.2*   < > 10.4 14.8* 12.2*  NEUTROABS 13.3*  --   --   --  10.0*  HGB 19.4*   < > 17.5* 16.8 14.1  HCT 56.6*   < > 51.8 49.7 43.8  MCV 98.6   < > 98.5 99.2 100.9*  PLT 112*   < > 57* 71* 50*   < > = values in this interval not displayed.    Scheduled Meds: . chlorhexidine gluconate (MEDLINE KIT)  15 mL Mouth Rinse BID  . fentaNYL (SUBLIMAZE) injection  50 mcg Intravenous Once  . folic acid  1 mg Intravenous Daily  . heparin injection (subcutaneous)  5,000 Units Subcutaneous Q8H  . hydrocortisone sod succinate (SOLU-CORTEF) inj  50 mg Intravenous Q6H  . mouth rinse  15 mL Mouth Rinse 10 times per day  . pantoprazole (PROTONIX) IV  40 mg Intravenous Q24H  . thiamine injection  100 mg Intravenous Daily   Continuous Infusions: .  prismasol BGK 4/2.5 500 mL/hr at 03/23/18 0938  .  prismasol BGK 4/2.5 500 mL/hr at 03/23/18 0938  . sodium chloride Stopped (03/23/18 0722)  . ceFEPime (MAXIPIME) IV 1 g (03/23/18 0920)  . dexmedetomidine 0.4 mcg/kg/hr (03/23/18 1028)  . fentaNYL infusion INTRAVENOUS 300 mcg/hr (03/23/18 1056)  . insulin 4.6 Units/hr (03/23/18 1122)  . metronidazole 100 mL/hr at 03/23/18 0700  . norepinephrine (LEVOPHED) Adult infusion 4 mcg/min (03/23/18 0700)  . prismasol BGK 4/2.5 1,500 mL/hr at 03/23/18 0939  . sodium chloride    . vasopressin (PITRESSIN) infusion - *FOR SHOCK* 0.02 Units/min (03/23/18 0700)   PRN Meds:.sodium  chloride, fentaNYL, heparin, sodium chloride   Pearson Grippe  MD 03/23/2018, 11:46 AM

## 2018-03-23 NOTE — Progress Notes (Signed)
Initial Nutrition Assessment  DOCUMENTATION CODES:   Obesity unspecified  INTERVENTION:  If unable to extubate, Recommend TF with Vital High Protein at goal rate of 45 ml/h (1080 ml per day) and Prostat 60 ml BID to provide 1480 kcals, 155 gm protein, 907 ml free water daily.  NUTRITION DIAGNOSIS:   Inadequate oral intake related to inability to eat as evidenced by NPO status.  GOAL:   Patient will meet greater than or equal to 90% of their needs, Provide needs based on ASPEN/SCCM guidelines  MONITOR:   Vent status, Labs, Skin, Weight trends, I & O's  REASON FOR ASSESSMENT:   Ventilator    ASSESSMENT:   53 year old male transferred overnight from [facility] for pancreatitis with septic shock and renal failure.  Patient originally presented on 1/1 with abdominal pain, nausea, vomiting.  His lipase was elevated at 763.  Findings included edema and fluid surrounding the pancreas consistent with pancreatitis. Pt decompensated, developing shock requiring vasopressors.  Developed respiratory failure requiring intubation. Patient had progressive renal failure and started on CRRT on 1/3.  Patient is currently intubated on ventilator support MV: 11.4 L/min Temp (24hrs), Avg:98.4 F (36.9 C), Min:96.8 F (36 C), Max:99.5 F (37.5 C)  Propofol: off  No family at bedside. RD unable to obtain most recent nutrition history. Labs and medications reviewed.    NUTRITION - FOCUSED PHYSICAL EXAM:    Most Recent Value  Orbital Region  No depletion  Upper Arm Region  No depletion  Thoracic and Lumbar Region  No depletion  Buccal Region  No depletion  Temple Region  No depletion  Clavicle Bone Region  No depletion  Clavicle and Acromion Bone Region  No depletion  Scapular Bone Region  Unable to assess  Dorsal Hand  Unable to assess  Patellar Region  No depletion  Anterior Thigh Region  No depletion  Posterior Calf Region  No depletion  Edema (RD Assessment)  None   Hair  Reviewed  Eyes  Unable to assess  Mouth  Unable to assess  Skin  Reviewed  Nails  Unable to assess       Diet Order:   Diet Order            Diet NPO time specified  Diet effective now              EDUCATION NEEDS:   Not appropriate for education at this time  Skin:  Skin Assessment: Reviewed RN Assessment  Last BM:  1/4  Height:   Ht Readings from Last 1 Encounters:  03/23/18 5\' 9"  (1.753 m)    Weight:   Wt Readings from Last 1 Encounters:  03/23/18 116.8 kg    Ideal Body Weight:  72.7 kg  BMI:  Body mass index is 38.03 kg/m.  Estimated Nutritional Needs:   Kcal:  2542-7062  Protein:  145-155 grams  Fluid:  >/= 1.6 L/day    Roslyn Smiling, MS, RD, LDN Pager # [phone] After hours/ weekend pager # [phone]

## 2018-03-23 NOTE — Progress Notes (Signed)
[facility] Physician-Brief Progress Note Patient Name: Randy Deleon DOB: [birth year] MRN: [id number]   Date of Service  03/23/2018  HPI/Events of Note  Transferred from [facility]. H and P reviewed. Data, labs, meds seen.  53 yr old male with alcohol induced acute pancreatitis with AKI/ARDS/septic shock, worsening suspected IAH with Bladder pressure reaching 18. On 3 pressors, abx. On Ventilator on ARDS low Vt. On prophylaxis. Was on CRRT. Ph 7.30, 21 hco3. Nephrology, Gen surgery to see.  If worsens need decompressive abdominal ex lap.   Camera evaluation done. Earlier discussed with bed side RN. In synchrony with vent. HR 80, 95%, 113/72  eICU Interventions  Continue care We have stopped D10 since TG level improving.      Intervention Category Major Interventions: Respiratory failure - evaluation and management;Sepsis - evaluation and management;Acid-Base disturbance - evaluation and management;Acute renal failure - evaluation and management Intermediate Interventions: Best-practice therapies (e.g. DVT, beta blocker, etc.) Evaluation Type: New Patient Evaluation  Ranee Gosselin 03/23/2018, 5:35 AM

## 2018-03-24 LAB — GLUCOSE, CAPILLARY
Glucose-Capillary: 213 mg/dL — ABNORMAL HIGH (ref 70–99)
Glucose-Capillary: 193 mg/dL — ABNORMAL HIGH (ref 70–99)
Glucose-Capillary: 185 mg/dL — ABNORMAL HIGH (ref 70–99)
Glucose-Capillary: 239 mg/dL — ABNORMAL HIGH (ref 70–99)
Glucose-Capillary: 161 mg/dL — ABNORMAL HIGH (ref 70–99)
Glucose-Capillary: 193 mg/dL — ABNORMAL HIGH (ref 70–99)
Glucose-Capillary: 196 mg/dL — ABNORMAL HIGH (ref 70–99)

## 2018-03-24 LAB — RENAL FUNCTION PANEL
Albumin: 1.8 g/dL — ABNORMAL LOW (ref 3.5–5.0)
Albumin: 1.9 g/dL — ABNORMAL LOW (ref 3.5–5.0)
Anion gap: 8 (ref 5–15)
Anion gap: 9 (ref 5–15)
BUN: 33 mg/dL — ABNORMAL HIGH (ref 6–20)
BUN: 37 mg/dL — ABNORMAL HIGH (ref 6–20)
CHLORIDE: 106 mmol/L (ref 98–111)
CO2: 21 mmol/L — ABNORMAL LOW (ref 22–32)
CO2: 21 mmol/L — ABNORMAL LOW (ref 22–32)
Calcium: 7.1 mg/dL — ABNORMAL LOW (ref 8.9–10.3)
Calcium: 7.5 mg/dL — ABNORMAL LOW (ref 8.9–10.3)
Chloride: 108 mmol/L (ref 98–111)
Creatinine, Ser: 1.44 mg/dL — ABNORMAL HIGH (ref 0.61–1.24)
Creatinine, Ser: 1.29 mg/dL — ABNORMAL HIGH (ref 0.61–1.24)
GFR calc Af Amer: >60 mL/min (ref >60)
GFR calc Af Amer: >60 mL/min (ref >60)
GFR calc non Af Amer: 56 mL/min — ABNORMAL LOW (ref >60)
GFR calc non Af Amer: >60 mL/min (ref >60)
Glucose, Bld: 229 mg/dL — ABNORMAL HIGH (ref 70–99)
Glucose, Bld: 180 mg/dL — ABNORMAL HIGH (ref 70–99)
POTASSIUM: 3.6 mmol/L (ref 3.5–5.1)
POTASSIUM: 4 mmol/L (ref 3.5–5.1)
Phosphorus: 3.2 mg/dL (ref 2.5–4.6)
Phosphorus: 2.7 mg/dL (ref 2.5–4.6)
Sodium: 137 mmol/L (ref 135–145)
Sodium: 136 mmol/L (ref 135–145)

## 2018-03-24 LAB — CBC
HCT: 39.5 % (ref 39.0–52.0)
HEMOGLOBIN: 13.1 g/dL (ref 13.0–17.0)
MCH: 33 pg (ref 26.0–34.0)
MCHC: 33.2 g/dL (ref 30.0–36.0)
MCV: 99.5 fL (ref 80.0–100.0)
Platelets: 51 10*3/uL — ABNORMAL LOW (ref 150–400)
RBC: 3.97 MIL/uL — ABNORMAL LOW (ref 4.22–5.81)
RDW: 13.2 % (ref 11.5–15.5)
WBC: 8.6 10*3/uL (ref 4.0–10.5)
nRBC: 0 % (ref 0.0–0.2)

## 2018-03-24 LAB — PHOSPHORUS: Phosphorus: 3.4 mg/dL (ref 2.5–4.6)

## 2018-03-24 LAB — MAGNESIUM: MAGNESIUM: 2.2 mg/dL (ref 1.7–2.4)

## 2018-03-24 MED ORDER — INSULIN ASPART 100 UNIT/ML ~~LOC~~ SOLN
0.0000 [IU] | SUBCUTANEOUS | Status: DC
  Administered 2018-03-24 – 2018-03-25 (×5): 4 [IU] via SUBCUTANEOUS
  Administered 2018-03-25: 7 [IU] via SUBCUTANEOUS
  Administered 2018-03-25: 4 [IU] via SUBCUTANEOUS
  Administered 2018-03-25 – 2018-03-26 (×2): 7 [IU] via SUBCUTANEOUS
  Administered 2018-03-26: 4 [IU] via SUBCUTANEOUS
  Administered 2018-03-26: 15 [IU] via SUBCUTANEOUS
  Administered 2018-03-26: 7 [IU] via SUBCUTANEOUS
  Administered 2018-03-26: 11 [IU] via SUBCUTANEOUS
  Administered 2018-03-26: 7 [IU] via SUBCUTANEOUS
  Administered 2018-03-27 (×2): 4 [IU] via SUBCUTANEOUS
  Administered 2018-03-27: 3 [IU] via SUBCUTANEOUS

## 2018-03-24 MED ORDER — INSULIN GLARGINE 100 UNIT/ML ~~LOC~~ SOLN
5.0000 [IU] | Freq: Every day | SUBCUTANEOUS | Status: DC
  Administered 2018-03-24 – 2018-03-25 (×2): 5 [IU] via SUBCUTANEOUS
  Filled 2018-03-24 (×2): qty 0.05

## 2018-03-24 NOTE — Procedures (Signed)
Admit: 03/23/2018 LOS: 59  53 year old male with AKI from normal baseline GFR secondary to contrast exposure and septic shock related to pancreatitis.  Current CRRT Prescription: Start Date: 03/22/17 at [facility] Catheter: L Femoral HD Temp Cathp placed 1/3 at OSH BFR: 160 Pre Blood Pump: 500 4K DFR: 1500 4K Replacement Rate: 500 4K Goal UF: 27m net neg Anticoagulation: none Clotting: none since starting  S: Off pressors FiO2 reduced to 60% Tolerating CRRT without clotting Phosphorus greater than 3 Potassium 3.6 0.9 L urine output  O: 01/04 0701 - 01/05 0700 In: 2963 [I.V.:1574.4; NG/GT:775; IV Piggyback:588.6] Out: 4120 [Urine:906; Emesis/NG output:200]  Filed Weights   03/23/18 0445 03/24/18 0500  Weight: 116.8 kg 114.8 kg    Recent Labs  Lab 03/22/18 2246 03/23/18 0509 03/23/18 1620 03/24/18 0411  NA 137 136 136 137  K 3.3* 3.9 3.8 3.6  CL 107 104 107 108  CO2 23 19* 20* 21*  GLUCOSE 175* 246* 161* 229*  BUN 31* 37* 32* 33*  CREATININE 2.37* 2.84* 1.80* 1.44*  CALCIUM 6.9* 6.5* 6.7* 7.1*  PHOS 3.3  --  3.3  3.4 3.4  3.2   Recent Labs  Lab 03/20/18 1350  03/22/18 0347 03/23/18 0509 03/24/18 0411  WBC 15.2*   < > 14.8* 12.2* 8.6  NEUTROABS 13.3*  --   --  10.0*  --   HGB 19.4*   < > 16.8 14.1 13.1  HCT 56.6*   < > 49.7 43.8 39.5  MCV 98.6   < > 99.2 100.9* 99.5  PLT 112*   < > 71* 50* 51*   < > = values in this interval not displayed.    Scheduled Meds: . chlorhexidine gluconate (MEDLINE KIT)  15 mL Mouth Rinse BID  . feeding supplement (PRO-STAT SUGAR FREE 64)  60 mL Per Tube BID  . feeding supplement (VITAL HIGH PROTEIN)  1,000 mL Per Tube Q24H  . fentaNYL (SUBLIMAZE) injection  50 mcg Intravenous Once  . folic acid  1 mg Intravenous Daily  . heparin injection (subcutaneous)  5,000 Units Subcutaneous Q8H  . hydrocortisone sod succinate (SOLU-CORTEF) inj  50 mg Intravenous Q6H  . insulin aspart  3 Units Subcutaneous Q4H  . insulin aspart  3-9 Units  Subcutaneous Q4H  . insulin detemir  10 Units Subcutaneous Q12H  . mouth rinse  15 mL Mouth Rinse 10 times per day  . pantoprazole (PROTONIX) IV  40 mg Intravenous Q24H  . thiamine injection  100 mg Intravenous Daily   Continuous Infusions: .  prismasol BGK 4/2.5 500 mL/hr at 03/24/18 0538  .  prismasol BGK 4/2.5 500 mL/hr at 03/24/18 0612  . sodium chloride 10 mL/hr at 03/24/18 0700  . ceFEPime (MAXIPIME) IV Stopped (03/23/18 2339)  . dexmedetomidine 0.5 mcg/kg/hr (03/24/18 0700)  . fentaNYL infusion INTRAVENOUS 325 mcg/hr (03/24/18 0700)  . metronidazole Stopped (03/24/18 02440  . norepinephrine (LEVOPHED) Adult infusion Stopped (03/23/18 2216)  . prismasol BGK 4/2.5 1,500 mL/hr at 03/24/18 0552  . sodium chloride    . vasopressin (PITRESSIN) infusion - *FOR SHOCK* 0.02 Units/min (03/23/18 0700)   PRN Meds:.sodium chloride, fentaNYL, heparin, sodium chloride  ABG    Component Value Date/Time   PHART 7.266 (L) 03/23/2018 0612   PCO2ART 40.7 03/23/2018 0612   PO2ART 57.0 (L) 03/23/2018 0612   HCO3 19.1 (L) 03/23/2018 0612   TCO2 20 (L) 03/23/2018 0612   ACIDBASEDEF 8.0 (H) 03/23/2018 0612   O2SAT 89.0 03/23/2018 0612    A/P  1. AKI likely ATN from Contrast 1/1 and shock; started CRRT 1/3 at OSH 2. Pancreatitis 3. Septic Shock on NE/VP; Cefepime/Flagyl; per TRH 4. DM2 5. VDRF / Hypoxia / ARDS 6. TCP   Continue CRRT at current flow settings of 4K.  Increase net ultrafiltration to max of 150 mL/h.  No heparin in circuit required at current time.  Goal today is improved volume status  Pearson Grippe, MD [employer] pgr [phone]

## 2018-03-24 NOTE — Progress Notes (Signed)
NAME:  Randy Deleon, MRN:  [id number], DOB:  15-Apr-1965, LOS: 1 ADMISSION DATE:  03/23/2018, CONSULTATION DATE:  1/4 REFERRING MD:  Dr. Belia Heman / [facility] , CHIEF COMPLAINT:  Abdominal pain    Brief History   53 y/o M admitted to [facility] on 1/4 from [facility] with complaints of abdominal pain.  Work up concerning for DKA, alcoholic pancreatitis, multiorgan failure requiring mechanical ventilation & CVVHD.    Past Medical History  HTN DM  ETOH Abuse   Significant Hospital Events   1/01 Admit to [facility]  1/04 Tx to [facility]   1/05 Remains on CVVHD  Consults:  Nephrology  General surgery   Procedures:  ETT 1/2 >>  L Fem HD 1/2 >>   Significant Diagnostic Tests:  CT ABD/Pelvis 1/1 >> pancreatitis, concern for evolving pseudocyst, diffuse mesenteric edema > concern for worsening pancreatitis, small bilateral pleural effusions  Micro Data:  Sputum 1/4 >>  BCx2 1/4 >>   Antimicrobials:  Cefepime 1/4 >> 1/5 Flatyl 1/4 >> 1/5  Interim history/subjective:  RN reports WUA performed > pt woke and reached for ett, attempted to pull tube.  HD filter clotted (70 ml of blood not able to be returned).  Off pressors.   Objective   Blood pressure 110/82, pulse 65, temperature 97.7 F (36.5 C), temperature source Esophageal, resp. rate (!) 26, height 5\' 9"  (1.753 m), weight 114.8 kg, SpO2 98 %.    Vent Mode: PRVC FiO2 (%):  [60 %-80 %] 60 % Set Rate:  [26 bmp] 26 bmp Vt Set:  [450 mL] 450 mL PEEP:  [10 cmH20-14 cmH20] 10 cmH20 Plateau Pressure:  [21 cmH20-24 cmH20] 21 cmH20   Intake/Output Summary (Last 24 hours) at 03/24/2018 1410 Last data filed at 03/24/2018 1400 Gross per 24 hour  Intake 3105.13 ml  Output 5281 ml  Net -2175.87 ml   Filed Weights   03/23/18 0445 03/24/18 0500  Weight: 116.8 kg 114.8 kg    Examination: General: adult male lying in bed, critically ill appearing  HEENT: MM pink/moist, ETT, mild scleral icterus  Neuro: sedate, pupils 64mm=/R CV: s1s2 rrr, no m/r/g PULM:  even/non-labored, lungs bilaterally clear  WV:PXTG, non-tender, bsx4 active  Extremities: warm/dry, generalized 1-2+ edema  Skin: no rashes or lesions  Resolved Hospital Problem list      Assessment & Plan:   Acute Pancreatitis  -suspect ETOH induced -no indication of necrosis on CT 1/3, evolving pancreatitis  P: Supportive care Discontinue abx & monitor Follow cultures   Shock - resolved  -r/o sepsis, suspect secondary to pancreatitis -off vasopressors 1/5 P: ICU monitoring  Discontinue solu-cortef   Acute Hypoxemic Respiratory Failure ARDS  Pleural Effusions P: PRVC, low Vt / protective ventilation with 6cc Wean PEEP / FiO2 for sats >88% Follow CXR  VAP prevention measures   AKI  Volume Overload  P: Nephrology following, appreciate assistance CVVHD  Trend BMP / urinary output Replace electrolytes as indicated Avoid nephrotoxic agents, ensure adequate renal perfusion  DM with Hyperglycemia  -exacerbated by steroids  P: SSI with resistant scale  Lantus 5 units QD  Suspected ETOH Withdrawal P: Monitor for withdrawal  Thiamine, folate IV  Thrombocytopenia  -suspected in setting of hepatic disease P: Follow CBC, monitor for bleeding   Best practice:  Diet: NPO Pain/Anxiety/Delirium protocol (if indicated): Fentanyl gtt  VAP protocol (if indicated): in place  DVT prophylaxis: heparin  GI prophylaxis: PPI  Glucose control: SSI Mobility: bedrest  Code Status: Full Code  Family Communication:  No family at bedside pm 1/5 Disposition: ICU   Labs   CBC: Recent Labs  Lab 03/20/18 1350  03/21/18 0400 03/22/18 0025 03/22/18 0347 03/23/18 0509 03/24/18 0411  WBC 15.2*   < > 9.6 10.4 14.8* 12.2* 8.6  NEUTROABS 13.3*  --   --   --   --  10.0*  --   HGB 19.4*   < > 18.7* 17.5* 16.8 14.1 13.1  HCT 56.6*   < > 55.5* 51.8 49.7 43.8 39.5  MCV 98.6   < > 99.8 98.5 99.2 100.9* 99.5  PLT 112*   < > 94* 57* 71* 50* 51*   < > = values in this interval not  displayed.    Basic Metabolic Panel: Recent Labs  Lab 03/22/18 1320 03/22/18 1727 03/22/18 2246 03/23/18 0509 03/23/18 1620 03/24/18 0411  NA 138 138 137 136 136 137  K 3.9 3.6 3.3* 3.9 3.8 3.6  CL 107 111 107 104 107 108  CO2 22 18* 23 19* 20* 21*  GLUCOSE 158* 168* 175* 246* 161* 229*  BUN 35* 34* 31* 37* 32* 33*  CREATININE 3.56* 3.00* 2.37* 2.84* 1.80* 1.44*  CALCIUM 6.6* 5.9* 6.9* 6.5* 6.7* 7.1*  MG 1.4* 1.8 1.8  --  2.1 2.2  PHOS 2.9 2.9 3.3  --  3.3  3.4 3.4  3.2   GFR: Estimated Creatinine Clearance: 76.6 mL/min (A) (by C-G formula based on SCr of 1.44 mg/dL (H)). Recent Labs  Lab 03/22/18 0025 03/22/18 0028 03/22/18 0347 03/23/18 0509 03/23/18 0510 03/24/18 0411  PROCALCITON 24.31  --   --   --   --   --   WBC 10.4  --  14.8* 12.2*  --  8.6  LATICACIDVEN  --  1.9 2.0*  --  1.9  --     Liver Function Tests: Recent Labs  Lab 03/20/18 1350 03/20/18 1620 03/21/18 0400  03/22/18 1727 03/22/18 2246 03/23/18 0509 03/23/18 1620 03/24/18 0411  AST 53* 48* 57*  --   --   --  50*  --   --   ALT 45* 44 34  --   --   --  37  --   --   ALKPHOS 75 69 50  --   --   --  65  --   --   BILITOT 0.7 1.2 1.4*  --   --   --  1.2  --   --   PROT 7.9 8.1 6.2*  --   --   --  5.2*  --   --   ALBUMIN 4.9 4.6 3.4*   < > 2.2* 2.3* 1.9* 1.8* 1.8*   < > = values in this interval not displayed.   Recent Labs  Lab 03/20/18 1620 03/22/18 0347 03/23/18 0509  LIPASE 763* 606* 152*   No results for input(s): AMMONIA in the last 168 hours.  ABG    Component Value Date/Time   PHART 7.266 (L) 03/23/2018 0612   PCO2ART 40.7 03/23/2018 0612   PO2ART 57.0 (L) 03/23/2018 0612   HCO3 19.1 (L) 03/23/2018 0612   TCO2 20 (L) 03/23/2018 0612   ACIDBASEDEF 8.0 (H) 03/23/2018 0612   O2SAT 89.0 03/23/2018 0612     Coagulation Profile: No results for input(s): INR, PROTIME in the last 168 hours.  Cardiac Enzymes: Recent Labs  Lab 03/20/18 1715  TROPONINI <0.03     HbA1C: Hgb A1c MFr Bld  Date/Time Value Ref Range Status  11/20/2017 09:09 AM  12.3 (H) 4.8 - 5.6 % Final    Comment:             Prediabetes: 5.7 - 6.4          Diabetes: >6.4          Glycemic control for adults with diabetes: <7.0     CBG: Recent Labs  Lab 03/23/18 2350 03/24/18 0403 03/24/18 0822 03/24/18 0844 03/24/18 1223  GLUCAP 228* 213* 193* 185* 239*    Critical care time: 30 minutes     Canary BrimBrandi Charlette Hennings, NP-C [facility] Pgr: [phone] or if no answer [phone] 03/24/2018, 2:10 PM

## 2018-03-25 ENCOUNTER — Inpatient Hospital Stay (HOSPITAL_COMMUNITY): Payer: BLUE CROSS/BLUE SHIELD

## 2018-03-25 LAB — GLUCOSE, CAPILLARY
Glucose-Capillary: 210 mg/dL — ABNORMAL HIGH (ref 70–99)
Glucose-Capillary: 228 mg/dL — ABNORMAL HIGH (ref 70–99)
Glucose-Capillary: 201 mg/dL — ABNORMAL HIGH (ref 70–99)
Glucose-Capillary: 181 mg/dL — ABNORMAL HIGH (ref 70–99)
Glucose-Capillary: 185 mg/dL — ABNORMAL HIGH (ref 70–99)
Glucose-Capillary: 187 mg/dL — ABNORMAL HIGH (ref 70–99)

## 2018-03-25 LAB — POCT I-STAT 3, ART BLOOD GAS (G3+)
Acid-base deficit: 1 mmol/L (ref 0.0–2.0)
Bicarbonate: 25.3 mmol/L (ref 20.0–28.0)
O2 Saturation: 96 %
PH ART: 7.355 (ref 7.350–7.450)
Patient temperature: 36.2
TCO2: 27 mmol/L (ref 22–32)
pCO2 arterial: 45 mmHg (ref 32.0–48.0)
pO2, Arterial: 85 mmHg (ref 83.0–108.0)

## 2018-03-25 LAB — RENAL FUNCTION PANEL
Albumin: 1.9 g/dL — ABNORMAL LOW (ref 3.5–5.0)
Albumin: 2.3 g/dL — ABNORMAL LOW (ref 3.5–5.0)
Anion gap: 8 (ref 5–15)
Anion gap: 11 (ref 5–15)
BUN: 35 mg/dL — ABNORMAL HIGH (ref 6–20)
BUN: 33 mg/dL — ABNORMAL HIGH (ref 6–20)
CO2: 24 mmol/L (ref 22–32)
CO2: 24 mmol/L (ref 22–32)
CREATININE: 1.17 mg/dL (ref 0.61–1.24)
Calcium: 8 mg/dL — ABNORMAL LOW (ref 8.9–10.3)
Calcium: 8.5 mg/dL — ABNORMAL LOW (ref 8.9–10.3)
Chloride: 104 mmol/L (ref 98–111)
Chloride: 101 mmol/L (ref 98–111)
Creatinine, Ser: 1.14 mg/dL (ref 0.61–1.24)
GFR calc non Af Amer: >60 mL/min (ref >60)
GFR calc non Af Amer: >60 mL/min (ref >60)
Glucose, Bld: 222 mg/dL — ABNORMAL HIGH (ref 70–99)
Glucose, Bld: 189 mg/dL — ABNORMAL HIGH (ref 70–99)
Phosphorus: 1.7 mg/dL — ABNORMAL LOW (ref 2.5–4.6)
Phosphorus: 1.8 mg/dL — ABNORMAL LOW (ref 2.5–4.6)
Potassium: 3.8 mmol/L (ref 3.5–5.1)
Potassium: 3.8 mmol/L (ref 3.5–5.1)
Sodium: 136 mmol/L (ref 135–145)
Sodium: 136 mmol/L (ref 135–145)

## 2018-03-25 LAB — MAGNESIUM: MAGNESIUM: 2.5 mg/dL — AB (ref 1.7–2.4)

## 2018-03-25 LAB — CBC
HCT: 40.5 % (ref 39.0–52.0)
Hemoglobin: 13.7 g/dL (ref 13.0–17.0)
MCH: 33.9 pg (ref 26.0–34.0)
MCHC: 33.8 g/dL (ref 30.0–36.0)
MCV: 100.2 fL — ABNORMAL HIGH (ref 80.0–100.0)
Platelets: 67 10*3/uL — ABNORMAL LOW (ref 150–400)
RBC: 4.04 MIL/uL — ABNORMAL LOW (ref 4.22–5.81)
RDW: 13.4 % (ref 11.5–15.5)
WBC: 8.2 10*3/uL (ref 4.0–10.5)
nRBC: 0.2 % (ref 0.0–0.2)

## 2018-03-25 LAB — CULTURE, RESPIRATORY W GRAM STAIN: Culture: NORMAL

## 2018-03-25 LAB — TRIGLYCERIDES: Triglycerides: 520 mg/dL — ABNORMAL HIGH (ref <150)

## 2018-03-25 MED ORDER — FOLIC ACID 1 MG PO TABS
1.0000 mg | ORAL_TABLET | Freq: Every day | ORAL | Status: DC
  Administered 2018-03-26 – 2018-03-30 (×4): 1 mg
  Filled 2018-03-25 (×4): qty 1

## 2018-03-25 MED ORDER — NICOTINE 21 MG/24HR TD PT24
21.0000 mg | MEDICATED_PATCH | Freq: Every day | TRANSDERMAL | Status: DC
  Administered 2018-03-25 – 2018-03-30 (×6): 21 mg via TRANSDERMAL
  Filled 2018-03-25 (×6): qty 1

## 2018-03-25 MED ORDER — FOLIC ACID 1 MG PO TABS: 1.0000 mg | ORAL_TABLET | Freq: Every day | ORAL | Status: DC

## 2018-03-25 MED ORDER — INSULIN GLARGINE 100 UNIT/ML ~~LOC~~ SOLN
10.0000 [IU] | Freq: Every day | SUBCUTANEOUS | Status: AC
  Administered 2018-03-26 – 2018-03-28 (×3): 10 [IU] via SUBCUTANEOUS
  Filled 2018-03-25 (×3): qty 0.1

## 2018-03-25 MED ORDER — ACETAMINOPHEN 160 MG/5ML PO SOLN
650.0000 mg | Freq: Four times a day (QID) | ORAL | Status: DC | PRN
  Administered 2018-03-25 – 2018-03-28 (×4): 650 mg
  Filled 2018-03-25 (×4): qty 20.3

## 2018-03-25 MED ORDER — VITAMIN B-1 100 MG PO TABS
100.0000 mg | ORAL_TABLET | Freq: Every day | ORAL | Status: DC
  Administered 2018-03-26 – 2018-03-30 (×4): 100 mg
  Filled 2018-03-25 (×4): qty 1

## 2018-03-25 NOTE — Progress Notes (Signed)
NAME:  Randy Deleon, MRN:  [id number], DOB:  09-Oct-1965, LOS: 2 ADMISSION DATE:  03/23/2018, CONSULTATION DATE:  03/23/2018 REFERRING MD:  Belia Heman - [facility], CHIEF COMPLAINT:  pancreatitis   HPI/course in hospital  53 year old man admitted with abdominal pain from alcoholic pancreatitis. Transferred from [facility] for MSOF requiring mechanical ventilation and CRRT.  Past Medical History   Past Medical History:  Diagnosis Date  . Diabetes mellitus without complication (HCC)   . Hypertension     Past Surgical History:  Procedure Laterality Date  . none       Interim history/subjective:  Remains intubated has been tolerating fluid removal.  Objective   Blood pressure 115/85, pulse 78, temperature (!) 100.4 F (38 C), temperature source Axillary, resp. rate 17, height 5\' 9"  (1.753 m), weight 112.7 kg, SpO2 95 %.    Vent Mode: PSV;CPAP FiO2 (%):  [40 %-60 %] 40 % Set Rate:  [26 bmp] 26 bmp Vt Set:  [420 mL-450 mL] 420 mL PEEP:  [8 cmH20-10 cmH20] 8 cmH20 Pressure Support:  [8 cmH20] 8 cmH20 Plateau Pressure:  [15 cmH20-23 cmH20] 15 cmH20   Intake/Output Summary (Last 24 hours) at 03/25/2018 1356 Last data filed at 03/25/2018 1300 Gross per 24 hour  Intake 2526.06 ml  Output 6340 ml  Net -3813.94 ml   Filed Weights   03/23/18 0445 03/24/18 0500 03/25/18 0405  Weight: 116.8 kg 114.8 kg 112.7 kg    Examination: Physical Exam  Constitutional: He appears well-developed. He appears lethargic. He is uncooperative. He appears ill. He is sedated and intubated.  obese  Eyes: Conjunctivae are normal. No scleral icterus.  Neck:  Endotracheal and orogastric tubes in place  Cardiovascular: Regular rhythm and normal heart sounds.  Generalized edema.  Respiratory: Breath sounds normal. He is intubated. No respiratory distress.  Tolerating PSV ventilation.  GI: He exhibits no mass. Bowel sounds are decreased. There is no abdominal tenderness.  Distended abdomen  Neurological: He appears  lethargic.  Responds to pain and orients to voice but not following commands.  Skin: Skin is warm.     Ancillary tests (personally reviewed)  CBC: Recent Labs  Lab 03/20/18 1350  03/22/18 0025 03/22/18 0347 03/23/18 0509 03/24/18 0411 03/25/18 0504  WBC 15.2*   < > 10.4 14.8* 12.2* 8.6 8.2  NEUTROABS 13.3*  --   --   --  10.0*  --   --   HGB 19.4*   < > 17.5* 16.8 14.1 13.1 13.7  HCT 56.6*   < > 51.8 49.7 43.8 39.5 40.5  MCV 98.6   < > 98.5 99.2 100.9* 99.5 100.2*  PLT 112*   < > 57* 71* 50* 51* 67*   < > = values in this interval not displayed.    Basic Metabolic Panel: Recent Labs  Lab 03/22/18 1727 03/22/18 2246 03/23/18 0509 03/23/18 1620 03/24/18 0411 03/24/18 1613 03/25/18 0549  NA 138 137 136 136 137 136 136  K 3.6 3.3* 3.9 3.8 3.6 4.0 3.8  CL 111 107 104 107 108 106 104  CO2 18* 23 19* 20* 21* 21* 24  GLUCOSE 168* 175* 246* 161* 229* 180* 222*  BUN 34* 31* 37* 32* 33* 37* 35*  CREATININE 3.00* 2.37* 2.84* 1.80* 1.44* 1.29* 1.17  CALCIUM 5.9* 6.9* 6.5* 6.7* 7.1* 7.5* 8.0*  MG 1.8 1.8  --  2.1 2.2  --  2.5*  PHOS 2.9 3.3  --  3.3  3.4 3.4  3.2 2.7 1.7*  GFR: Estimated Creatinine Clearance: 93.5 mL/min (by C-G formula based on SCr of 1.17 mg/dL). Recent Labs  Lab 03/22/18 0025 03/22/18 0028 03/22/18 0347 03/23/18 0509 03/23/18 0510 03/24/18 0411 03/25/18 0504  PROCALCITON 24.31  --   --   --   --   --   --   WBC 10.4  --  14.8* 12.2*  --  8.6 8.2  LATICACIDVEN  --  1.9 2.0*  --  1.9  --   --     Liver Function Tests: Recent Labs  Lab 03/20/18 1350 03/20/18 1620 03/21/18 0400  03/23/18 0509 03/23/18 1620 03/24/18 0411 03/24/18 1613 03/25/18 0549  AST 53* 48* 57*  --  50*  --   --   --   --   ALT 45* 44 34  --  37  --   --   --   --   ALKPHOS 75 69 50  --  65  --   --   --   --   BILITOT 0.7 1.2 1.4*  --  1.2  --   --   --   --   PROT 7.9 8.1 6.2*  --  5.2*  --   --   --   --   ALBUMIN 4.9 4.6 3.4*   < > 1.9* 1.8* 1.8* 1.9* 1.9*   <  > = values in this interval not displayed.   Recent Labs  Lab 03/20/18 1620 03/22/18 0347 03/23/18 0509  LIPASE 763* 606* 152*   No results for input(s): AMMONIA in the last 168 hours.  ABG    Component Value Date/Time   PHART 7.355 03/25/2018 0511   PCO2ART 45.0 03/25/2018 0511   PO2ART 85.0 03/25/2018 0511   HCO3 25.3 03/25/2018 0511   TCO2 27 03/25/2018 0511   ACIDBASEDEF 1.0 03/25/2018 0511   O2SAT 96.0 03/25/2018 0511     Coagulation Profile: No results for input(s): INR, PROTIME in the last 168 hours.  Cardiac Enzymes: Recent Labs  Lab 03/20/18 1715  TROPONINI <0.03    HbA1C: Hgb A1c MFr Bld  Date/Time Value Ref Range Status  11/20/2017 09:09 AM 12.3 (H) 4.8 - 5.6 % Final    Comment:             Prediabetes: 5.7 - 6.4          Diabetes: >6.4          Glycemic control for adults with diabetes: <7.0     CBG: Recent Labs  Lab 03/24/18 1953 03/24/18 2324 03/25/18 0331 03/25/18 0803 03/25/18 1157  GLUCAP 193* 196* 228* 201* 181*   CT abdomen on 1/3 showed possible evolving pseudocyst.  CXR: personally reviewed 1/6 - much improved bilateral effusions and airspace disease. Residual right atelectasis.  Assessment & Plan:   Critically ill due to acute respiratory failure requiring mechanical ventilation. Lung mechanics acceptable for extubation but still remains overly sedated. Extra-pulmonary ARDS due to pancreatitis with superimposed volume overload. Acute alcoholic pancreatitis with improving inflammatory parameter, thus appears to be resolving. Distributive shock from pancreatitis has resolved.  AKI on CRRT   Plan  Continue fluid removal with CRRT.  Consider transition to IHD as vasopressor requirements have resolved. Daily WUA and SBT to assess readiness to separate from mechanical ventilation.  Minimize sedation infusions as mental status principal barrier to extubation.    Best practice:  Diet: Tolerating tube feeds delivered into  duodenum. Pain/Anxiety/Delirium protocol (if indicated): Titrate fentanyl and Precedex down. VAP protocol (if indicated):  Bundle in place. DVT prophylaxis: heparin tid GI prophylaxis: pantoprazole Glucose control: marginal control - increase glargine. Mobility: bedrest Code Status: full Family Communication: No family present. Disposition: ICU, work towards extubation.   Critical care time: 35 spent including review of clinical data including imaging, examination of the patient and assessment of respiratory readiness for extubation and adjustment of sedative infusions.    Lynnell Catalan, MD [facility] ICU Physician [facility]  Pager: [phone] Mobile: [phone] After hours: [phone].  03/25/2018, 1:56 PM

## 2018-03-25 NOTE — Progress Notes (Signed)
Nutrition Follow-up  DOCUMENTATION CODES:   Obesity unspecified  INTERVENTION:   Vital High Protein at goal rate of 45 ml/h (1080 ml per day) and Prostat 60 ml BID to provide 1480 kcals, 155 gm protein, 907 ml free water daily.   NUTRITION DIAGNOSIS:   Inadequate oral intake related to inability to eat as evidenced by NPO status. Ongoing.   GOAL:   Patient will meet greater than or equal to 90% of their needs, Provide needs based on ASPEN/SCCM guidelines Progressing.   MONITOR:   Vent status, Labs, Skin, Weight trends, I & O's  REASON FOR ASSESSMENT:   Consult Enteral/tube feeding initiation and management  ASSESSMENT:   53 year old male transferred overnight from [facility] for pancreatitis with septic shock and renal failure.  Patient originally presented on 1/1 with abdominal pain, nausea, vomiting.  His lipase was elevated at 763.  Findings included edema and fluid surrounding the pancreas consistent with pancreatitis. Pt decompensated, developing shock requiring vasopressors.  Developed respiratory failure requiring intubation. Patient had progressive renal failure and started on CRRT on 1/3.  Concern for ARDS 1/2 OG tube, tip has migrated to first portion of the duodenum  1/3 CRRT 1/4 pressors stopped, propofol off, possible transition to iHD from CRRT.   Admission weight to [facility] of 116.8 kg, now down 6 L with weight of 112.7 kg. Per chart review pt reports his weight is usually 250 lb (113.6 kg) Patient is currently intubated on ventilator support Temp (24hrs), Avg:97.8 F (36.6 C), Min:97 F (36.1 C), Max:100.4 F (38 C)  Propofol: off Medications reviewed and include: folic acid, SSI, thiamine  Labs reviewed: PO4: 1.7 (L), TG: 520 (H) Lab Results  Component Value Date   HGBA1C 12.3 (H) 11/20/2017  BP: 99/77 MAP: 84   I/O: -6090 ml since admit UOP: 506 ml x 24 hrs   OG: Vital High Protein @ 40 ml/hr with 30 ml Prostat BID Provides: 1160  kcal and 114 grams protein    Diet Order:   Diet Order            Diet NPO time specified  Diet effective now              EDUCATION NEEDS:   Not appropriate for education at this time  Skin:  Skin Assessment: Reviewed RN Assessment  Last BM:  1/4  Height:   Ht Readings from Last 1 Encounters:  03/23/18 5\' 9"  (1.753 m)    Weight:   Wt Readings from Last 1 Encounters:  03/25/18 112.7 kg    Ideal Body Weight:  72.7 kg  BMI:  Body mass index is 36.69 kg/m.  Estimated Nutritional Needs:   Kcal:  3009-2330  Protein:  145-181 grams  Fluid:  >/= 1.6 L/day  Kendell Bane RD, LDN, CNSC [phone] Pager [phone] After Hours Pager

## 2018-03-25 NOTE — Progress Notes (Signed)
eLink Physician-Brief Progress Note Patient Name: Randy Deleon DOB: 16-Apr-1965 MRN: [id number]   Date of Service  03/25/2018  HPI/Events of Note  Tobacco abuse - Patient smokes 3 packs/day. Request for Nicotine patch.   eICU Interventions  Will order Nicotine patch 21 mg to skin now and Q day.      Intervention Category Major Interventions: Other:  Lenell Antu 03/25/2018, 9:17 PM

## 2018-03-25 NOTE — Progress Notes (Signed)
This note also relates to the following rows which could not be included: SpO2 - Cannot attach notes to unvalidated device data  6cc orde. t on 450 instead of 420.  RT made changes at this time.

## 2018-03-25 NOTE — Progress Notes (Addendum)
Inpatient Diabetes Program Recommendations  AACE/ADA: New Consensus Statement on Inpatient Glycemic Control (2019)  Target Ranges:  Prepandial:   less than 140 mg/dL      Peak postprandial:   less than 180 mg/dL (1-2 hours)      Critically ill patients:  140 - 180 mg/dL   Results for ULUS, POTTORF (MRN [id number]) as of 03/25/2018 09:18  Ref. Range 03/24/2018 08:44 03/24/2018 12:23 03/24/2018 16:12 03/24/2018 19:53 03/24/2018 23:24 03/25/2018 03:31 03/25/2018 08:03  Glucose-Capillary Latest Ref Range: 70 - 99 mg/dL 646 (H) 803 (H) 212 (H) 193 (H) 196 (H) 228 (H) 201 (H)   Review of Glycemic Control  Diabetes history: DM2 Outpatient Diabetes medications: Invokana 300 mg daily, Kombiglyze XR 07-998 mg BID Current orders for Inpatient glycemic control: Lantus 5 units daily, Novolog 0-20 units Q4H; Vital @ 45 ml/hr  Inpatient Diabetes Program Recommendations:   Insulin - Basal: Please consider increasing Lantus to 10 units daily.  Insulin - Tube Feeding Coverage: Please consider ordering Novolog 3 units Q4H for tube feeding coverage. If tube feeding is stopped or held then Novolog tube feeding coverage should also be stopped or held.  HgbA1C: Last A1C 12.3% on 11/20/17 indicating an average glucose of 306 mg/dl. Please order current A1C.  Oral Agents: At time of discharge, recommend discontinuing Kombiglyze and Invokana and have patient take insulin as an outpatient.   NOTE: In reviewing chart, noted patient was inpatient from 11/23/17 to 11/25/17 with DKA and noted patient refused to take insulin as an outpatient. Patient was noted to have DKA again this admission as well as pancreatitis.  Noted patient takes Kombiglyze and Invokana as an outpatient and per H&P patient has not taken DM medications for past week.  Kombiglyze can cause pancreatitis and Invokana can increase risk of developing DKA. Therefore, would strongly recommend Kombiglyze and Invokana be discontinued as an outpatient and that patient be  discharged on insulin for DM control.  Thanks, Orlando Penner, RN, MSN, CDE Diabetes Coordinator Inpatient Diabetes Program [phone] (Team Pager from 8am to 5pm)

## 2018-03-25 NOTE — Progress Notes (Signed)
[facility]    NEPHROLOGY PROGRESS NOTE  SUBJECTIVE: Sedated on ventilator, unable to provide review of systems.  Off pressors this morning   OBJECTIVE:  Vitals:   03/25/18 1115 03/25/18 1200  BP: 115/85   Pulse: 78   Resp: 17   Temp:  (!) 100.4 F (38 C)  SpO2: 95%    I/O last 3 completed shifts: In: 4989.9 [P.O.:180; I.V.:2414.8; NG/GT:1880; IV Piggyback:515.1] Out: 3790 [WIOXB:353; GDJME:2683]   [place]l: Sedated, no acute distress HEENT: MMM [place] AT anicteric sclera, intubated Neck:  No JVD, no adenopathy CV:  Heart RRR  Lungs:  L/S decreased at bases  abd: Distended, decreased bowel sounds, nontender to palpation GU:  Bladder non-palpable Extremities: +1 bilateral lower extremity edema Skin:  No skin rash  MEDICATIONS:   Current Facility-Administered Medications:  .   prismasol BGK 4/2.5 infusion, , CRRT, Continuous, Sanford, Ryan B, MD, Last Rate: 500 mL/hr at 03/25/18 0244 .   prismasol BGK 4/2.5 infusion, , CRRT, Continuous, Sanford, Ryan B, MD, Last Rate: 500 mL/hr at 03/25/18 0319 .  0.9 %  sodium chloride infusion, , Intravenous, PRN, Kloefkorn, [place], MD, Last Rate: 10 mL/hr at 03/25/18 0700 .  chlorhexidine gluconate (MEDLINE KIT) (PERIDEX) 0.12 % solution 15 mL, 15 mL, Mouth Rinse, BID, Kloefkorn, [place], MD, 15 mL at 03/25/18 0808 .  dexmedetomidine (PRECEDEX) 400 MCG/100ML (4 mcg/mL) infusion, 0-1.2 mcg/kg/hr, Intravenous, Titrated, Kloefkorn, [place], MD, Last Rate: 35 mL/hr at 03/25/18 1059, 1.2 mcg/kg/hr at 03/25/18 1059 .  feeding supplement (PRO-STAT SUGAR FREE 64) liquid 60 mL, 60 mL, Per Tube, BID, Icard, Bradley L, DO, 60 mL at 03/25/18 1011 .  feeding supplement (VITAL HIGH PROTEIN) liquid 1,000 mL, 1,000 mL, Per Tube, Q24H, Icard, Bradley L, DO, 1,000 mL at 03/24/18 1733 .  fentaNYL (SUBLIMAZE) bolus via infusion 50 mcg, 50 mcg, Intravenous, Q1H PRN, Kloefkorn, [place], MD, 50 mcg at 03/24/18 2116 .  fentaNYL 2529mg in NS 2554m(1077mml)  infusion-PREMIX, 25-400 mcg/hr, Intravenous, Continuous, Kloefkorn, ChaMaliD, Last Rate: 40 mL/hr at 03/25/18 0724, 400 mcg/hr at 03/25/18 0724 .  [START ON 1/74/03/9620]lic acid (FOLVITE) tablet 1 mg, 1 mg, Per Tube, Daily, Masters, AliJake ChurchPH .  folic acid injection 1 mg, 1 mg, Intravenous, Daily, Ollis, Brandi L, NP, 1 mg at 03/25/18 1012 .  heparin injection 1,000-6,000 Units, 1,000-6,000 Units, CRRT, PRN, SanPearson Grippe MD .  heparin injection 5,000 Units, 5,000 Units, Subcutaneous, Q8H, Kloefkorn, ChaMaliD, 5,000 Units at 03/25/18 [phone] insulin aspart (novoLOG) injection 0-20 Units, 0-20 Units, Subcutaneous, Q4H, Ollis, Brandi L, NP, 4 Units at 03/25/18 1237 .  insulin glargine (LANTUS) injection 5 Units, 5 Units, Subcutaneous, Daily, Ollis, Brandi L, NP, 5 Units at 03/25/18 1012 .  MEDLINE mouth rinse, 15 mL, Mouth Rinse, 10 times per day, Kloefkorn, ChaMaliD, 15 mL at 03/25/18 1237 .  norepinephrine (LEVOPHED) 4 mg in dextrose 5 % 250 mL (0.016 mg/mL) infusion, 0-40 mcg/min, Intravenous, Titrated, Kloefkorn, ChaMaliD, Stopped at 03/23/18 2216 .  pantoprazole (PROTONIX) injection 40 mg, 40 mg, Intravenous, Q24H, Kloefkorn, ChaMaliD, 40 mg at 03/25/18 0358 .  prismasol BGK 4/2.5 infusion, , CRRT, Continuous, Sanford, Ryan B, MD, Last Rate: 1,500 mL/hr at 03/25/18 1042 .  sodium chloride 0.9 % primer fluid for CRRT, , CRRT, PRN, SanPearson Grippe MD .  thiamine (B-1) injection 100 mg, 100 mg, Intravenous, Daily, Ollis, Brandi L, NP, 100 mg at 03/25/18 1012 .  [START ON 03/26/2018] thiamine (  VITAMIN B-1) tablet 100 mg, 100 mg, Per Tube, Daily, Icard, Bradley L, DO     LABS:  CBC Latest Ref Rng & Units 03/25/2018 03/24/2018 03/23/2018  WBC 4.0 - 10.5 K/uL 8.2 8.6 12.2(H)  Hemoglobin 13.0 - 17.0 g/dL 13.7 13.1 14.1  Hematocrit 39.0 - 52.0 % 40.5 39.5 43.8  Platelets 150 - 400 K/uL 67(L) 51(L) 50(L)    CMP Latest Ref Rng & Units 03/25/2018 03/24/2018 03/24/2018  Glucose 70 - 99 mg/dL 222(H) 180(H)  229(H)  BUN 6 - 20 mg/dL 35(H) 37(H) 33(H)  Creatinine 0.61 - 1.24 mg/dL 1.17 1.29(H) 1.44(H)  Sodium 135 - 145 mmol/L 136 136 137  Potassium 3.5 - 5.1 mmol/L 3.8 4.0 3.6  Chloride 98 - 111 mmol/L 104 106 108  CO2 22 - 32 mmol/L 24 21(L) 21(L)  Calcium 8.9 - 10.3 mg/dL 8.0(L) 7.5(L) 7.1(L)  Total Protein 6.5 - 8.1 g/dL - - -  Total Bilirubin 0.3 - 1.2 mg/dL - - -  Alkaline Phos 38 - 126 U/L - - -  AST 15 - 41 U/L - - -  ALT 0 - 44 U/L - - -    Lab Results  Component Value Date   CALCIUM 8.0 (L) 03/25/2018   PHOS 1.7 (L) 03/25/2018       Component Value Date/Time   COLORURINE YELLOW (A) 03/20/2018 1715   APPEARANCEUR CLEAR (A) 03/20/2018 1715   LABSPEC >1.046 (H) 03/20/2018 1715   PHURINE 5.0 03/20/2018 1715   GLUCOSEU >=500 (A) 03/20/2018 1715   HGBUR MODERATE (A) 03/20/2018 1715   BILIRUBINUR NEGATIVE 03/20/2018 1715   KETONESUR 20 (A) 03/20/2018 1715   PROTEINUR 100 (A) 03/20/2018 1715   NITRITE NEGATIVE 03/20/2018 1715   LEUKOCYTESUR NEGATIVE 03/20/2018 1715      Component Value Date/Time   PHART 7.355 03/25/2018 0511   PCO2ART 45.0 03/25/2018 0511   PO2ART 85.0 03/25/2018 0511   HCO3 25.3 03/25/2018 0511   TCO2 27 03/25/2018 0511   ACIDBASEDEF 1.0 03/25/2018 0511   O2SAT 96.0 03/25/2018 0511    No results found for: IRON, TIBC, FERRITIN, IRONPCTSAT     ASSESSMENT/PLAN:     Problem List Items Addressed This Visit      Respiratory   Respiratory failure, acute ([place])   Relevant Orders   DG CHEST PORT 1 VIEW (Completed)    Other Visit Diagnoses    Respiration abnormal       Relevant Orders   DG CHEST PORT 1 VIEW (Completed)      53 year old male patient with acute kidney injury secondary to contrast exposure and septic shock related to pancreatitis.  Has been on CRRT since 03/22/2017.  1.  Acute kidney injury.  Baseline serum creatinine is normal.  Acute kidney injury likely secondary to contrast exposure and septic shock related to pancreatitis.  He has  been on CRRT since 04/01/2017.  He has a left femoral dialysis catheter placed on 1/3 at outside hospital.  CRRT prescription: BFR: 160 Pre Blood Pump: 500 4K DFR: 1500 4K Replacement Rate: 500 4K Goal UF: 148m net negative Anticoagulation: none Clotting: none since starting  Has had about 30 to 50 mL's per hour of urine output.  We will continue CRRT with a total UF net of 200 mL's per hour goal.  Will not restart CRRT if it clots off.  2.  Pancreatitis.  Hemodynamically is improving.  Is markedly fluid overloaded.  Will attempt ultrafiltration on CRRT.  3.  Septic shock.  Is now  off pressors.  Continue antibiotics per primary team.  4.  Diabetes mellitus.  5.  Vent dependent respiratory failure.  We will attempt to improve his oxygenation with ultrafiltration.  [place], DO, [place]

## 2018-03-26 DIAGNOSIS — J9601 Acute respiratory failure with hypoxia: Secondary | ICD-10-CM

## 2018-03-26 LAB — BASIC METABOLIC PANEL
Anion gap: 10 (ref 5–15)
BUN: 56 mg/dL — ABNORMAL HIGH (ref 6–20)
CO2: 24 mmol/L (ref 22–32)
Calcium: 8.5 mg/dL — ABNORMAL LOW (ref 8.9–10.3)
Chloride: 104 mmol/L (ref 98–111)
Creatinine, Ser: 1.7 mg/dL — ABNORMAL HIGH (ref 0.61–1.24)
GFR calc Af Amer: 53 mL/min — ABNORMAL LOW (ref >60)
GFR calc non Af Amer: 46 mL/min — ABNORMAL LOW (ref >60)
Glucose, Bld: 279 mg/dL — ABNORMAL HIGH (ref 70–99)
Potassium: 4.1 mmol/L (ref 3.5–5.1)
SODIUM: 138 mmol/L (ref 135–145)

## 2018-03-26 LAB — POCT I-STAT 3, ART BLOOD GAS (G3+)
Acid-base deficit: 3 mmol/L — ABNORMAL HIGH (ref 0.0–2.0)
Bicarbonate: 23.4 mmol/L (ref 20.0–28.0)
O2 Saturation: 96 %
Patient temperature: 37.7
TCO2: 25 mmol/L (ref 22–32)
pCO2 arterial: 46.5 mmHg (ref 32.0–48.0)
pH, Arterial: 7.313 — ABNORMAL LOW (ref 7.350–7.450)
pO2, Arterial: 92 mmHg (ref 83.0–108.0)

## 2018-03-26 LAB — GLUCOSE, CAPILLARY
GLUCOSE-CAPILLARY: 225 mg/dL — AB (ref 70–99)
Glucose-Capillary: 159 mg/dL — ABNORMAL HIGH (ref 70–99)
Glucose-Capillary: 219 mg/dL — ABNORMAL HIGH (ref 70–99)
Glucose-Capillary: 255 mg/dL — ABNORMAL HIGH (ref 70–99)

## 2018-03-26 LAB — CBC
HCT: 40.5 % (ref 39.0–52.0)
Hemoglobin: 13.4 g/dL (ref 13.0–17.0)
MCH: 33.7 pg (ref 26.0–34.0)
MCHC: 33.1 g/dL (ref 30.0–36.0)
MCV: 101.8 fL — ABNORMAL HIGH (ref 80.0–100.0)
Platelets: 106 10*3/uL — ABNORMAL LOW (ref 150–400)
RBC: 3.98 MIL/uL — ABNORMAL LOW (ref 4.22–5.81)
RDW: 13.7 % (ref 11.5–15.5)
WBC: 14 10*3/uL — AB (ref 4.0–10.5)
nRBC: 0.1 % (ref 0.0–0.2)

## 2018-03-26 LAB — RENAL FUNCTION PANEL
Albumin: 1.9 g/dL — ABNORMAL LOW (ref 3.5–5.0)
Anion gap: 11 (ref 5–15)
BUN: 56 mg/dL — ABNORMAL HIGH (ref 6–20)
CO2: 23 mmol/L (ref 22–32)
Calcium: 8.5 mg/dL — ABNORMAL LOW (ref 8.9–10.3)
Chloride: 104 mmol/L (ref 98–111)
Creatinine, Ser: 1.68 mg/dL — ABNORMAL HIGH (ref 0.61–1.24)
GFR calc non Af Amer: 47 mL/min — ABNORMAL LOW (ref >60)
GFR, EST AFRICAN AMERICAN: 54 mL/min — AB (ref >60)
Glucose, Bld: 279 mg/dL — ABNORMAL HIGH (ref 70–99)
PHOSPHORUS: 3.2 mg/dL (ref 2.5–4.6)
Potassium: 4.1 mmol/L (ref 3.5–5.1)
Sodium: 138 mmol/L (ref 135–145)

## 2018-03-26 LAB — MAGNESIUM: Magnesium: 2.7 mg/dL — ABNORMAL HIGH (ref 1.7–2.4)

## 2018-03-26 MED ORDER — FENTANYL BOLUS VIA INFUSION
100.0000 ug | INTRAVENOUS | Status: DC | PRN
  Filled 2018-03-26: qty 100

## 2018-03-26 MED ORDER — PANTOPRAZOLE SODIUM 40 MG PO PACK
40.0000 mg | PACK | Freq: Every day | ORAL | Status: DC
  Administered 2018-03-26: 40 mg
  Filled 2018-03-26: qty 20

## 2018-03-26 MED ORDER — INSULIN ASPART 100 UNIT/ML ~~LOC~~ SOLN
4.0000 [IU] | SUBCUTANEOUS | Status: DC
  Administered 2018-03-26 (×2): 4 [IU] via SUBCUTANEOUS

## 2018-03-26 NOTE — Progress Notes (Signed)
NAME:  Randy Deleon, MRN:  [id number], DOB:  04/28/1965, LOS: 3 ADMISSION DATE:  03/23/2018, CONSULTATION DATE:  03/23/2018 REFERRING MD:  Belia Heman - [facility], CHIEF COMPLAINT:  pancreatitis   HPI/course in hospital  53 year old man admitted with abdominal pain from alcoholic pancreatitis. Transferred from [facility] for MS[facility] requiring mechanical ventilation and CRRT.  Past Medical History   Past Medical History:  Diagnosis Date  . Diabetes mellitus without complication (HCC)   . Hypertension     Past Surgical History:  Procedure Laterality Date  . none       Interim history/subjective:  Remains intubated, tolerating pressure support all morning however drowsy but becomes very agitated when sedation weaned per nursing.  No acute events overnight.  Objective   Blood pressure 129/77, pulse 98, temperature 98.4 F (36.9 C), resp. rate 20, height 5\' 9"  (1.753 m), weight 108.6 kg, SpO2 98 %.    Vent Mode: CPAP;PSV FiO2 (%):  [40 %] 40 % Set Rate:  [18 bmp-26 bmp] 18 bmp Vt Set:  [420 mL] 420 mL PEEP:  [5 cmH20-8 cmH20] 5 cmH20 Pressure Support:  [5 cmH20-8 cmH20] 5 cmH20 Plateau Pressure:  [8 cmH20-25 cmH20] 8 cmH20   Intake/Output Summary (Last 24 hours) at 03/26/2018 1048 Last data filed at 03/26/2018 1000 Gross per 24 hour  Intake 3019.67 ml  Output 4642 ml  Net -1622.33 ml   Filed Weights   03/24/18 0500 03/25/18 0405 03/26/18 0500  Weight: 114.8 kg 112.7 kg 108.6 kg    Examination: Physical Exam  Constitutional: He appears well-developed. He appears lethargic. He is uncooperative. He appears ill. He is sedated and intubated.  obese  Eyes: Conjunctivae are normal. No scleral icterus.  Neck:  Endotracheal and orogastric tubes in place  Cardiovascular: Regular rhythm and normal heart sounds.  Respiratory: Breath sounds normal. He is intubated. No respiratory distress.  Tolerating PSV ventilation.  GI: He exhibits no mass. There is abdominal tenderness (mild in midepigastrum).    Distended abdomen  Neurological: He appears lethargic.  Following commands inconsistently, generally weak appearing  Skin: Skin is warm.     Ancillary tests (personally reviewed)  CBC: Recent Labs  Lab 03/20/18 1350  03/22/18 0347 03/23/18 0509 03/24/18 0411 03/25/18 0504 03/26/18 0551  WBC 15.2*   < > 14.8* 12.2* 8.6 8.2 14.0*  NEUTROABS 13.3*  --   --  10.0*  --   --   --   HGB 19.4*   < > 16.8 14.1 13.1 13.7 13.4  HCT 56.6*   < > 49.7 43.8 39.5 40.5 40.5  MCV 98.6   < > 99.2 100.9* 99.5 100.2* 101.8*  PLT 112*   < > 71* 50* 51* 67* 106*   < > = values in this interval not displayed.    Basic Metabolic Panel: Recent Labs  Lab 03/22/18 2246  03/23/18 1620 03/24/18 0411 03/24/18 1613 03/25/18 0549 03/25/18 1630 03/26/18 0500 03/26/18 0551  NA 137   < > 136 137 136 136 136 138 138  K 3.3*   < > 3.8 3.6 4.0 3.8 3.8 4.1 4.1  CL 107   < > 107 108 106 104 101 104 104  CO2 23   < > 20* 21* 21* 24 24 23 24   GLUCOSE 175*   < > 161* 229* 180* 222* 189* 279* 279*  BUN 31*   < > 32* 33* 37* 35* 33* 56* 56*  CREATININE 2.37*   < > 1.80* 1.44* 1.29* 1.17  1.14 1.68* 1.70*  CALCIUM 6.9*   < > 6.7* 7.1* 7.5* 8.0* 8.5* 8.5* 8.5*  MG 1.8  --  2.1 2.2  --  2.5*  --   --  2.7*  PHOS 3.3  --  3.3  3.4 3.4  3.2 2.7 1.7* 1.8* 3.2  --    < > = values in this interval not displayed.   GFR: Estimated Creatinine Clearance: 63.2 mL/min (A) (by C-G formula based on SCr of 1.7 mg/dL (H)). Recent Labs  Lab 03/22/18 0025 03/22/18 0028 03/22/18 0347 03/23/18 0509 03/23/18 0510 03/24/18 0411 03/25/18 0504 03/26/18 0551  PROCALCITON 24.31  --   --   --   --   --   --   --   WBC 10.4  --  14.8* 12.2*  --  8.6 8.2 14.0*  LATICACIDVEN  --  1.9 2.0*  --  1.9  --   --   --     Liver Function Tests: Recent Labs  Lab 03/20/18 1350 03/20/18 1620 03/21/18 0400  03/23/18 0509  03/24/18 0411 03/24/18 1613 03/25/18 0549 03/25/18 1630 03/26/18 0500  AST 53* 48* 57*  --  50*  --   --    --   --   --   --   ALT 45* 44 34  --  37  --   --   --   --   --   --   ALKPHOS 75 69 50  --  65  --   --   --   --   --   --   BILITOT 0.7 1.2 1.4*  --  1.2  --   --   --   --   --   --   PROT 7.9 8.1 6.2*  --  5.2*  --   --   --   --   --   --   ALBUMIN 4.9 4.6 3.4*   < > 1.9*   < > 1.8* 1.9* 1.9* 2.3* 1.9*   < > = values in this interval not displayed.   Recent Labs  Lab 03/20/18 1620 03/22/18 0347 03/23/18 0509  LIPASE 763* 606* 152*   No results for input(s): AMMONIA in the last 168 hours.  ABG    Component Value Date/Time   PHART 7.313 (L) 03/26/2018 0708   PCO2ART 46.5 03/26/2018 0708   PO2ART 92.0 03/26/2018 0708   HCO3 23.4 03/26/2018 0708   TCO2 25 03/26/2018 0708   ACIDBASEDEF 3.0 (H) 03/26/2018 0708   O2SAT 96.0 03/26/2018 0708     Coagulation Profile: No results for input(s): INR, PROTIME in the last 168 hours.  Cardiac Enzymes: Recent Labs  Lab 03/20/18 1715  TROPONINI <0.03    HbA1C: Hgb A1c MFr Bld  Date/Time Value Ref Range Status  11/20/2017 09:09 AM 12.3 (H) 4.8 - 5.6 % Final    Comment:             Prediabetes: 5.7 - 6.4          Diabetes: >6.4          Glycemic control for adults with diabetes: <7.0     CBG: Recent Labs  Lab 03/25/18 1157 03/25/18 1612 03/25/18 2013 03/25/18 2341 03/26/18 0817  GLUCAP 181* 185* 187* 255* 219*   CT abdomen on 1/3 showed possible evolving pseudocyst.  CXR: personally reviewed 1/6 - much improved bilateral effusions and airspace disease. Residual right atelectasis.  Assessment & Plan:   Critically  ill due to acute respiratory failure requiring mechanical ventilation. Lung mechanics acceptable for extubation but still having difficulty weaning sedation without severe agitation Extra-pulmonary ARDS due to pancreatitis with superimposed volume overload. Acute alcoholic pancreatitis with improving inflammatory parameter, thus appears to be resolving. Distributive shock from pancreatitis has  resolved.  AKI off CRRT, monitoring  Plan  Off CRRT, per Renal we will monitor UOP and BMP to consider hemodialysis if needed Encouraged nurse to wean fentanyl drip and use boluses to attempt to wake him up. May be able to extubate today if he is slightly more awake. Continue precedex as needed for agitation On discharge will need to discontinue Kombiglyze and Invokana and have patient take insulin as an outpatient per diabetes coordinator   Best practice:  Diet: Tolerating tube feeds delivered into duodenum. Pain/Anxiety/Delirium protocol (if indicated): Titrate fentanyl and Precedex down. VAP protocol (if indicated):  Bundle in place. DVT prophylaxis: heparin tid GI prophylaxis: pantoprazole Glucose control: marginal control - increase glargine, started mealtime humaog 1/7 Mobility: bedrest Code Status: full Family Communication: No family present. Disposition: ICU, work towards extubation.   Critical care time: 30 spent including review of clinical data including imaging, examination of the patient and assessment of respiratory readiness for extubation and adjustment of sedative infusions.    Italyhad Asmara Backs, MD [facility]/Critical Care Pager [phone] After hours pager: [phone]   03/26/2018, 10:48 AM

## 2018-03-26 NOTE — Progress Notes (Signed)
[name] Progress Note    Assessment/ Plan:   53 year old male patient with acute kidney injury secondary to contrast exposure and septic shock related to pancreatitis.  Has been on CRRT 03/22/2018 - 03/25/2018  1.  Acute kidney injury.  Baseline serum creatinine is normal.  Acute kidney injury likely secondary to contrast exposure and septic shock related to pancreatitis.   He has a left femoral dialysis catheter placed on 1/3 at outside hospital.   - CRRT off 7pm 03/25/2018 - Has decent UOP -> will monitor UOP and BMP tomorrow for any signs of recovery off RRT. - If he needs RRT then will trial w/ iHD  2.  Pancreatitis.  Hemodynamically is improving.   3.  Septic shock.  Is now off pressors.  Continue antibiotics per primary team.  4.  Diabetes mellitus.  5.  Vent dependent respiratory failure.  We will attempt to improve his oxygenation with ultrafiltration.  Subjective:   Responding to some questions. Able to squeeze my hand on command.  No events overnight   Objective:   BP (!) 156/84   Pulse 87   Temp 100 F (37.8 C)   Resp 20   Ht '5\' 9"'$  (1.753 m)   Wt 108.6 kg   SpO2 96%   BMI 35.36 kg/m   Intake/Output Summary (Last 24 hours) at 03/26/2018 9323 Last data filed at 03/26/2018 0600 Gross per 24 hour  Intake 3034.83 ml  Output 4928 ml  Net -1893.17 ml   Weight change: -4.1 kg  Physical Exam: General: No acute distress CV:  Heart RRR  Lungs:  No rales, on vent abd: Distended, decreased bowel sounds, nontender to palpation, soft Extremities: +1 bilateral lower extremity edema Skin:  No skin rash GU: Foley to gravity  Imaging: Dg Chest Port 1 View  Result Date: 03/25/2018 CLINICAL DATA:  Acute respiratory failure with hypoxia. EXAM: PORTABLE CHEST 1 VIEW COMPARISON:  One-view chest x-ray 03/23/2018. FINDINGS: The patient remains intubated. Endotracheal tube terminates 5 cm above the carina. A temperature probe is in place. NG tube courses off  the inferior border of the film. Mild cardiac enlargement is stable. Bilateral pleural effusions and interstitial edema have improved. Mild bibasilar airspace disease remains. IMPRESSION: 1. Improving aeration with decreasing edema and bilateral effusions. 2. Support apparatus is stable. 3. Small bilateral pleural effusions remain. 4. Bibasilar airspace disease remains, indicative of atelectasis. Electronically Signed   By: San Morelle M.D.   On: 03/25/2018 07:42    Labs: BMET Recent Labs  Lab 03/22/18 2246  03/23/18 1620 03/24/18 0411 03/24/18 1613 03/25/18 0549 03/25/18 1630 03/26/18 0500 03/26/18 0551  NA 137   < > 136 137 136 136 136 138 138  K 3.3*   < > 3.8 3.6 4.0 3.8 3.8 4.1 4.1  CL 107   < > 107 108 106 104 101 104 104  CO2 23   < > 20* 21* 21* '24 24 23 24  '$ GLUCOSE 175*   < > 161* 229* 180* 222* 189* 279* 279*  BUN 31*   < > 32* 33* 37* 35* 33* 56* 56*  CREATININE 2.37*   < > 1.80* 1.44* 1.29* 1.17 1.14 1.68* 1.70*  CALCIUM 6.9*   < > 6.7* 7.1* 7.5* 8.0* 8.5* 8.5* 8.5*  PHOS 3.3  --  3.3  3.4 3.4  3.2 2.7 1.7* 1.8* 3.2  --    < > = values in this interval not displayed.   CBC Recent Labs  Lab  03/20/18 1350  03/23/18 0509 03/24/18 0411 03/25/18 0504 03/26/18 0551  WBC 15.2*   < > 12.2* 8.6 8.2 14.0*  NEUTROABS 13.3*  --  10.0*  --   --   --   HGB 19.4*   < > 14.1 13.1 13.7 13.4  HCT 56.6*   < > 43.8 39.5 40.5 40.5  MCV 98.6   < > 100.9* 99.5 100.2* 101.8*  PLT 112*   < > 50* 51* 67* 106*   < > = values in this interval not displayed.    Medications:    . chlorhexidine gluconate (MEDLINE KIT)  15 mL Mouth Rinse BID  . feeding supplement (PRO-STAT SUGAR FREE 64)  60 mL Per Tube BID  . feeding supplement (VITAL HIGH PROTEIN)  1,000 mL Per Tube Q24H  . folic acid  1 mg Per Tube Daily  . folic acid  1 mg Intravenous Daily  . heparin injection (subcutaneous)  5,000 Units Subcutaneous Q8H  . insulin aspart  0-20 Units Subcutaneous Q4H  . insulin  glargine  10 Units Subcutaneous Daily  . mouth rinse  15 mL Mouth Rinse 10 times per day  . nicotine  21 mg Transdermal Daily  . pantoprazole (PROTONIX) IV  40 mg Intravenous Q24H  . thiamine injection  100 mg Intravenous Daily  . thiamine  100 mg Per Tube Daily      Otelia Santee, MD 03/26/2018, 7:38 AM

## 2018-03-26 NOTE — Progress Notes (Signed)
Blood gas results discussed with CCM NP. New vent orders entered.

## 2018-03-26 NOTE — Progress Notes (Signed)
Inpatient Diabetes Program Recommendations  AACE/ADA: New Consensus Statement on Inpatient Glycemic Control   Target Ranges:  Prepandial:   less than 140 mg/dL      Peak postprandial:   less than 180 mg/dL (1-2 hours)      Critically ill patients:  140 - 180 mg/dL   Results for Randy Deleon, Randy Deleon (MRN [id number]) as of 03/26/2018 08:52  Ref. Range 03/25/2018 08:03 03/25/2018 11:57 03/25/2018 16:12 03/25/2018 20:13 03/25/2018 23:41 03/26/2018 08:17  Glucose-Capillary Latest Ref Range: 70 - 99 mg/dL 700 (H) 174 (H) 944 (H) 187 (H) 255 (H) 219 (H)   Review of Glycemic Control  Diabetes history: DM2 Outpatient Diabetes medications: Invokana 300 mg daily, Kombiglyze XR 07-998 mg BID Current orders for Inpatient glycemic control: Lantus 10 units daily, Novolog 0-20 units Q4H; Vital @ 45 ml/hr  Inpatient Diabetes Program Recommendations:   Insulin - Basal: Noted Lantus was increased to 10 units daily to start today.  Insulin - Tube Feeding Coverage: Please consider ordering Novolog 4 units Q4H for tube feeding coverage. If tube feeding is stopped or held then Novolog tube feeding coverage should also be stopped or held.  HgbA1C:Last A1C 12.3% on 11/20/17 indicating an average glucose of 306 mg/dl.Please order current A1C.  Oral Agents: At time of discharge, recommend discontinuing Kombiglyze and Invokana and have patient take insulin as an outpatient.  Thanks, Orlando Penner, RN, MSN, CDE Diabetes Coordinator Inpatient Diabetes Program [phone] (Team Pager from 8am to 5pm)

## 2018-03-26 NOTE — Procedures (Signed)
Extubation Procedure Note  Patient Details:   Name: Randy Deleon DOB: 1965-05-10 MRN: [id number]   Airway Documentation:    Vent end date: 03/26/18 Vent end time: 1555   Evaluation  O2 sats: stable throughout Complications: No apparent complications Patient did tolerate procedure well. Bilateral Breath Sounds: Diminished   Yes   Pt extubated to 4L N/C.  No stridor noted.  RN @ bedside.  Christophe Louis 03/26/2018, 3:57 PM

## 2018-03-27 LAB — RENAL FUNCTION PANEL
ALBUMIN: 1.8 g/dL — AB (ref 3.5–5.0)
Anion gap: 7 (ref 5–15)
BUN: 44 mg/dL — ABNORMAL HIGH (ref 6–20)
CO2: 25 mmol/L (ref 22–32)
Calcium: 8.6 mg/dL — ABNORMAL LOW (ref 8.9–10.3)
Chloride: 113 mmol/L — ABNORMAL HIGH (ref 98–111)
Creatinine, Ser: 1.24 mg/dL (ref 0.61–1.24)
GFR calc Af Amer: >60 mL/min (ref >60)
GFR calc non Af Amer: >60 mL/min (ref >60)
GLUCOSE: 179 mg/dL — AB (ref 70–99)
PHOSPHORUS: 2.3 mg/dL — AB (ref 2.5–4.6)
Potassium: 4.1 mmol/L (ref 3.5–5.1)
Sodium: 145 mmol/L (ref 135–145)

## 2018-03-27 LAB — GLUCOSE, CAPILLARY
Glucose-Capillary: 135 mg/dL — ABNORMAL HIGH (ref 70–99)
Glucose-Capillary: 149 mg/dL — ABNORMAL HIGH (ref 70–99)
Glucose-Capillary: 157 mg/dL — ABNORMAL HIGH (ref 70–99)
Glucose-Capillary: 173 mg/dL — ABNORMAL HIGH (ref 70–99)
Glucose-Capillary: 189 mg/dL — ABNORMAL HIGH (ref 70–99)
Glucose-Capillary: 212 mg/dL — ABNORMAL HIGH (ref 70–99)
Glucose-Capillary: 247 mg/dL — ABNORMAL HIGH (ref 70–99)

## 2018-03-27 LAB — CBC
HCT: 40.2 % (ref 39.0–52.0)
HEMOGLOBIN: 13.3 g/dL (ref 13.0–17.0)
MCH: 33.1 pg (ref 26.0–34.0)
MCHC: 33.1 g/dL (ref 30.0–36.0)
MCV: 100 fL (ref 80.0–100.0)
Platelets: 157 10*3/uL (ref 150–400)
RBC: 4.02 MIL/uL — ABNORMAL LOW (ref 4.22–5.81)
RDW: 13.7 % (ref 11.5–15.5)
WBC: 19.9 10*3/uL — ABNORMAL HIGH (ref 4.0–10.5)
nRBC: 0 % (ref 0.0–0.2)

## 2018-03-27 LAB — MAGNESIUM: Magnesium: 2.5 mg/dL — ABNORMAL HIGH (ref 1.7–2.4)

## 2018-03-27 MED ORDER — ORAL CARE MOUTH RINSE
15.0000 mL | Freq: Two times a day (BID) | OROMUCOSAL | Status: DC
  Administered 2018-03-27 – 2018-03-28 (×3): 15 mL via OROMUCOSAL

## 2018-03-27 MED ORDER — INSULIN ASPART 100 UNIT/ML ~~LOC~~ SOLN
2.0000 [IU] | Freq: Three times a day (TID) | SUBCUTANEOUS | Status: DC
  Administered 2018-03-27 – 2018-03-30 (×8): 2 [IU] via SUBCUTANEOUS

## 2018-03-27 MED ORDER — INSULIN ASPART 100 UNIT/ML ~~LOC~~ SOLN
0.0000 [IU] | Freq: Three times a day (TID) | SUBCUTANEOUS | Status: DC
  Administered 2018-03-27: 7 [IU] via SUBCUTANEOUS
  Administered 2018-03-27: 4 [IU] via SUBCUTANEOUS
  Administered 2018-03-27: 3 [IU] via SUBCUTANEOUS
  Administered 2018-03-28 – 2018-03-29 (×7): 4 [IU] via SUBCUTANEOUS
  Administered 2018-03-30 (×2): 7 [IU] via SUBCUTANEOUS

## 2018-03-27 MED ORDER — CLONIDINE HCL 0.1 MG PO TABS
0.2000 mg | ORAL_TABLET | Freq: Four times a day (QID) | ORAL | Status: DC
  Administered 2018-03-27 – 2018-03-28 (×4): 0.2 mg via ORAL
  Filled 2018-03-27 (×4): qty 2

## 2018-03-27 MED ORDER — CHLORHEXIDINE GLUCONATE 0.12 % MT SOLN
15.0000 mL | Freq: Two times a day (BID) | OROMUCOSAL | Status: DC
  Administered 2018-03-27 – 2018-03-29 (×6): 15 mL via OROMUCOSAL
  Filled 2018-03-27 (×5): qty 15

## 2018-03-27 NOTE — Evaluation (Signed)
Physical Therapy Evaluation Patient Details Name: Randy MeckelKevin W Deleon MRN: [id number] DOB: 1965-07-10 Today's Date: 03/27/2018   History of Present Illness  53 y.o. male [facility] 03[facility]23/[facility] due to N/V and abdominal pain.  He was dx there with acute pancreatitis and diabetic ketoacidosis.  His medical conditioned worsened requiring transfer to the ICU, intubation, vasopressors, and CCRT due to renal failure.  General surgery was consulted due to suspicion of abdominal compartment syndrome.  [facility] higher level of care.  Pt extubated 03/26/18.  Pt formally dx with acute hypoxic respiratory failure secondary to ARDS from pancreatitis with superimposed volume overload, acute alcoholic pancreatitis, acute metabolic encephalopathy, acute kidney injury, fluid and electrolyte imbalance, and on going SIRS with leukocytosis.  Pt with significant PMH of HTN and DM.   Clinical Impression  Pt is very weak and deconditioned.  He is also very flat and slow to process making him slow to move.  Per his reports he is independent at baseline.  He would benefit from post acute rehab before he returns home.  I am hopeful for a speedy physical recovery now that his medical issues are stabilizing.    Follow Up Recommendations CIR    Equipment Recommendations  Rolling walker with 5" wheels;3in1 (PT)    Recommendations for Other Services Rehab consult;OT consult     Precautions / Restrictions Precautions Precautions: Fall Precaution Comments: due to generalized weakness      Mobility  Bed Mobility Overal bed mobility: Needs Assistance Bed Mobility: Supine to Sit     Supine to sit: Mod assist;HOB elevated     General bed mobility comments: Mod assist to come sit EOB with assist mostly at trunk to pull up to sitting.  HOB elevated ~45 degrees.   Transfers Overall transfer level: Needs assistance Equipment used: None Transfers: Sit to/from [employer] CorporationStand;Stand Pivot Transfers Sit to Stand:  Mod assist Stand pivot transfers: Mod assist       General transfer comment: Mod assist to stand EOB with posterior preference once standing.  Mod assist to stand and take pivotal steps to the recliner chair, signs of instability and weakness with buckling legs.    Ambulation/Gait             General Gait Details: unable to safely today.       Balance Overall balance assessment: Needs assistance Sitting-balance support: Feet supported;No upper extremity supported;Bilateral upper extremity supported Sitting balance-Leahy Scale: Fair Sitting balance - Comments: close supervision EOB, posterior preference. Postural control: Posterior lean Standing balance support: Bilateral upper extremity supported Standing balance-Leahy Scale: Poor Standing balance comment: needs external support in standing.                              Pertinent Vitals/Pain Pain Assessment: No/denies pain    Home Living Family/patient expects to be discharged to:: Private residence Living Arrangements: Children(daughter) Available Help at Discharge: Family;Available PRN/intermittently Type of Home: House Home Access: Stairs to enter   Enter[employer] CorporationEntrance Stairs-Number of Steps: 2 Home Layout: One level Home Equipment: None Additional Comments: hx will need to be confired as it was hard to hear pt and I am not sure of the accuracy of his responses.     Prior Function Level of Independence: Independent         Comments: works     Higher education careers adviserHand Dominance   Dominant Hand: Right    Extremity/Trunk Assessment   Upper  Extremity Assessment Upper Extremity Assessment: Generalized weakness(unable to lift arms above his head)    Lower Extremity Assessment Lower Extremity Assessment: Generalized weakness(grossly 3-3+/5)    Cervical / Trunk Assessment Cervical / Trunk Assessment: Normal  Communication   Communication: Other (comment)(soft, mumbling whisper)  Cognition Arousal/Alertness:  Awake/alert Behavior During Therapy: Flat affect Overall Cognitive Status: Impaired/Different from baseline Area of Impairment: Following commands;Problem solving                       Following Commands: Follows one step commands with increased time     Problem Solving: Slow processing General Comments: Pt is very flat, very slow to process and execute commands.             Assessment/Plan    PT Assessment Patient needs continued PT services  PT Problem List Decreased strength;Decreased activity tolerance;Decreased balance;Decreased mobility;Decreased cognition;Decreased knowledge of use of DME;Decreased safety awareness;Cardiopulmonary status limiting activity       PT Treatment Interventions DME instruction;Gait training;Stair training;Therapeutic activities;Functional mobility training;Therapeutic exercise;Balance training;Patient/family education;Cognitive remediation    PT Goals (Current goals can be found in the Care Plan section)  Acute Rehab PT Goals Patient Stated Goal: None stated (very flat) PT Goal Formulation: Patient unable to participate in goal setting Time For Goal Achievement: 04/10/18 Potential to Achieve Goals: Good    Frequency Min 3X/week           AM-PAC PT "6 Clicks" Mobility  Outcome Measure Help needed turning from your back to your side while in a flat bed without using bedrails?: A Lot Help needed moving from lying on your back to sitting on the side of a flat bed without using bedrails?: A Lot Help needed moving to and from a bed to a chair (including a wheelchair)?: A Lot Help needed standing up from a chair using your arms (e.g., wheelchair or bedside chair)?: A Lot Help needed to walk in hospital room?: A Lot Help needed climbing 3-5 steps with a railing? : Total 6 Click Score: 11    End of Session Equipment Utilized During Treatment: Gait belt;Oxygen(3 L O2 [place]) Activity Tolerance: Patient limited by fatigue Patient left:  in chair;with call bell/phone within reach Nurse Communication: Mobility status PT Visit Diagnosis: Muscle weakness (generalized) (M62.81);Difficulty in walking, not elsewhere classified (R26.2)    Time: 8756-4332 PT Time Calculation (min) (ACUTE ONLY): 24 min   Charges:         Lurena Joiner B. Sherrol Vicars, PT, DPT  Acute Rehabilitation [phone] pager #[phone] office   PT Evaluation $PT Eval Moderate Complexity: 1 Mod PT Treatments $Gait Training: 8-22 mins        03/27/2018, 3:48 PM

## 2018-03-27 NOTE — Progress Notes (Signed)
Rehab Admissions Coordinator Note:  Patient was screened by Clois Dupes for appropriateness for an Inpatient Acute Rehab Consult per PT recommendation.   At this time, we are recommending Inpatient Rehab consult. Please place order for consult.  Clois Dupes RN MSN 03/27/2018, 4:05 PM  I can be reached at [phone].

## 2018-03-27 NOTE — Evaluation (Signed)
Clinical/Bedside Swallow Evaluation Patient Details  Name: Randy MeckelKevin W Deleon MRN: [id number] Date of Birth: 08-24-1965  Today's Date: 03/27/2018 Time: SLP Start Time (ACUTE ONLY): 1115 SLP Stop Time (ACUTE ONLY): 1130 SLP Time Calculation (min) (ACUTE ONLY): 15 min  Past Medical History:  Past Medical History:  Diagnosis Date  . Diabetes mellitus without complication (HCC)   . Hypertension    Past Surgical History:  Past Surgical History:  Procedure Laterality Date  . none     HPI:  53 year old man admitted with abdominal pain from alcoholic pancreatitis. 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 Vocal intensity is moderately low with mildly weak volitional cough. He consumed consecutive volumes of 3 oz twice with resulting mild throat clearing x 2. Oral manipulatin and propulsion of puree and solid was unremarkable for dysfunction during assessment. Affect flat, intentional in actions,  follows commands, functional oral movements and absent of neuro involvement therefore increases risk for successful po intake. Recommend Dys 3 texture (energy conservation to initiate poi's), thin liquids, straws allowed and pills with liquid. Briefly follow for upgrade and safety.  SLP Visit Diagnosis: Dysphagia, unspecified (R13.10)    Aspiration Risk  Mild aspiration risk    Diet Recommendation Dysphagia 3 (Mech soft);Thin liquid   Liquid Administration via: Cup;Straw Medication Administration: Whole meds with liquid Supervision: Patient able to self feed Compensations: Slow rate;Small sips/bites Postural Changes: Seated upright at 90 degrees    Other  Recommendations Oral Care Recommendations: Oral  care BID   Follow up Recommendations None      Frequency and Duration min 1 x/week  2 weeks       Prognosis Prognosis for Safe Diet Advancement: Good      Swallow Study   General HPI: 53 year old man admitted with abdominal pain from alcoholic pancreatitis. 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 Type of Study: Bedside Swallow Evaluation Previous Swallow Assessment: none Diet Prior to this Study: NPO Temperature Spikes Noted: Yes(101) Respiratory Status: Nasal cannula History of Recent Intubation: Yes Length of Intubations (days): 5 days Date extubated: 03/26/18 Behavior/Cognition: Alert;Cooperative Oral Cavity Assessment: Other (comment)(significant lingual candidia) Oral Care Completed by SLP: Yes Oral Cavity - Dentition: Adequate natural dentition Vision: Functional for self-feeding Self-Feeding Abilities: Needs assist;Needs set up Patient Positioning: Upright in bed Baseline Vocal Quality: Low vocal intensity Volitional Cough: Weak Volitional Swallow: Able to elicit    Oral/Motor/Sensory Function Overall Oral Motor/Sensory Function: Within functional limits   Ice Chips Ice chips: Not tested   Thin Liquid Thin Liquid: Impaired Pharyngeal  Phase Impairments: Throat Clearing - Delayed    Nectar Thick Nectar Thick Liquid: Not tested   Honey Thick Honey Thick Liquid: Not tested   Puree Puree: Within functional limits   Solid     Solid: Within functional limits      Royce MacadamiaLitaker, [place] Willis 03/27/2018,11:49 AM  Breck CoonsLisa Willis Lonell FaceLitaker M.Ed Nurse, children'sCCC-SLP Speech-Language Pathologist Pager [phone] Office [phone]

## 2018-03-27 NOTE — Progress Notes (Signed)
Had a conversation with Randy SimmondsPete Babcock, NP for [facility] about pt's foley and he ordered to keep foley another day to avoid delirium/agitation due to retention. Will assess foley need tomorrow.

## 2018-03-27 NOTE — Progress Notes (Signed)
NAME:  Randy Deleon, MRN:  [id number], DOB:  [birth year], LOS: 4 ADMISSION DATE:  03/23/2018, CONSULTATION DATE:  03/23/2018 REFERRING MD:  Belia Heman - [facility], CHIEF COMPLAINT:  pancreatitis   HPI  53 year old man admitted with abdominal pain from alcoholic pancreatitis. Transferred from [facility] for MSOF requiring mechanical ventilation and CRRT.  Past Medical History  DM., HTN  Hospital Course   1/4: Transferred to [facility] , nephrology consulted, CRRT resumed.  This had been initially started on 1/3 at [facility] 1/5: Vasopressors weaned off.  Tolerating fluid removal.  Antibiotics stopped.  Insulin drip discontinued.  Sedation regimen changed to Precedex with decreasing RASS goal 1/6: Initiating weaning attempts, CRRT discontinued when filter clotted 1/7: Extubated successfully  Interim history/subjective:  No distress .still encephalopathic  Objective   Blood pressure (Abnormal) 161/85, pulse 95, temperature (Abnormal) 101 F (38.3 C), temperature source Oral, resp. rate (Abnormal) 34, height 5\' 9"  (1.753 m), weight 106.3 kg, SpO2 96 %.    Vent Mode: CPAP;PSV FiO2 (%):  [40 %] 40 % PEEP:  [5 cmH20] 5 cmH20 Pressure Support:  [5 cmH20] 5 cmH20   Intake/Output Summary (Last 24 hours) at 03/27/2018 0940 Last data filed at 03/27/2018 0800 Gross per 24 hour  Intake 849.16 ml  Output 2050 ml  Net -1200.84 ml   Filed Weights   03/25/18 0405 03/26/18 0500 03/27/18 0435  Weight: 112.7 kg 108.6 kg 106.3 kg   Physical exam General:'s a pleasant 53 year old male patient currently resting in bed.  He remains encephalopathic and confused at times picking at IV sites HEENT normocephalic atraumatic no jugular venous distention mucous membranes are moist Pulmonary: Clear to auscultation diminished bases no accessory use Cardiac: Regular rate rhythm without murmur rub or gallop Abdomen:Soft nontender no organomegaly Extremities: Trace dependent edema brisk capillary refill Neuro: Awake,  interactive, follows commands, oriented x1-2 but requires frequent reorientation at times attempts to get out of bed    Distributive shock 2/2 pancreatitis   Assessment & Plan:    Acute Hypoxic Respiratory Failure 2/2 ARDS from pancreatitis with superimposed volume overload. -Last chest x-ray was obtained on 1/6: This demonstrates Endotracheal tube in satisfactory position.  There is bibasilar atelectasis, improving aeration bilaterally when comparing film from 2 days prior -Currently -6.9 L -Successfully extubated 1/7 Plan Wean oxygen Mobilize Aspiration precautions  Acute alcoholic pancreatitis -lipase trending down (last check was 152 on 1/4 from 606) Plan Advance diet In a.m. lipase Alcohol cessation  Acute metabolic Encephalopathy -Remains on Precedex Plan Continuing Precedex, adding clonidine protocol in effort to stop Precedex Discontinue fentanyl Continue folic acid And thiamine  AKI  -renal fxn normalizing -Off CRRT since 1/6 Plan Allowing him to auto diuresis Avoid dehydration A.m. chemistry Renally adjust medications as appropriate  Fluid and electrolyte imbalance Plan A.m. chemistry  On-going SIRS w/ Leukocytosis (suspect more 2/2 pancreatitis) Plan Trend fever curve A.m. CBC Repeating lipase as mentioned above  DM Plan Lantus and sliding scale insulin. Discontinuing Kombiglyze and Invokana as out-pt    Best practice:  Diet: start PO diet 1/8. Pain/Anxiety/Delirium protocol (if indicated): PAD protocol discontinued 1/7, weaning Precedex and initiating clonidine protocol 1/8 VAP protocol (if indicated): Discontinued 1/7 DVT prophylaxis: heparin tid GI prophylaxis: pantoprazole, discontinued 1/7 Glucose control: marginal control - increase glargine, started mealtime humaog 1/7 Mobility: bedrest Code Status: full Family Communication: No family present. Disposition remains critically ill due to severe metabolic encephalopathy we are working  to titrate Precedex infusion to off.  From a  pulmonary standpoint he is improving, continues to auto diurese after his pancreatitis were weaning oxygen.  We will mobilize him today.  We will keep him in the intensive care until he is off Precedex infusion.  Critical care time: 15    Simonne Martinet ACNP-BC [facility] Pulmonary/Critical Care Pager # [phone] OR # [phone] if no answer   03/27/2018, 9:40 AM

## 2018-03-28 ENCOUNTER — Encounter (HOSPITAL_COMMUNITY): Payer: Self-pay

## 2018-03-28 LAB — CULTURE, BLOOD (ROUTINE X 2)
Culture: NO GROWTH
Culture: NO GROWTH
Special Requests: ADEQUATE
Special Requests: ADEQUATE

## 2018-03-28 LAB — RENAL FUNCTION PANEL
Albumin: 1.9 g/dL — ABNORMAL LOW (ref 3.5–5.0)
Anion gap: 11 (ref 5–15)
BUN: 32 mg/dL — ABNORMAL HIGH (ref 6–20)
CALCIUM: 8.6 mg/dL — AB (ref 8.9–10.3)
CO2: 23 mmol/L (ref 22–32)
Chloride: 109 mmol/L (ref 98–111)
Creatinine, Ser: 1.09 mg/dL (ref 0.61–1.24)
GFR calc Af Amer: >60 mL/min (ref >60)
GFR calc non Af Amer: >60 mL/min (ref >60)
Glucose, Bld: 208 mg/dL — ABNORMAL HIGH (ref 70–99)
Phosphorus: 3 mg/dL (ref 2.5–4.6)
Potassium: 3.7 mmol/L (ref 3.5–5.1)
SODIUM: 143 mmol/L (ref 135–145)

## 2018-03-28 LAB — GLUCOSE, CAPILLARY
Glucose-Capillary: 182 mg/dL — ABNORMAL HIGH (ref 70–99)
Glucose-Capillary: 165 mg/dL — ABNORMAL HIGH (ref 70–99)
Glucose-Capillary: 163 mg/dL — ABNORMAL HIGH (ref 70–99)
Glucose-Capillary: 172 mg/dL — ABNORMAL HIGH (ref 70–99)

## 2018-03-28 LAB — MAGNESIUM: Magnesium: 2 mg/dL (ref 1.7–2.4)

## 2018-03-28 MED ORDER — BISACODYL 10 MG RE SUPP: 10.0000 mg | Freq: Every day | RECTAL | Status: DC | PRN

## 2018-03-28 MED ORDER — POLYETHYLENE GLYCOL 3350 17 G PO PACK: 17.0000 g | PACK | Freq: Every day | ORAL | Status: DC | PRN

## 2018-03-28 MED ORDER — CLONIDINE HCL 0.1 MG PO TABS
0.1000 mg | ORAL_TABLET | Freq: Four times a day (QID) | ORAL | Status: AC
  Administered 2018-03-28 (×3): 0.1 mg via ORAL
  Filled 2018-03-28 (×3): qty 1

## 2018-03-28 MED ORDER — CLONIDINE HCL 0.1 MG PO TABS
0.1000 mg | ORAL_TABLET | Freq: Every day | ORAL | Status: AC
  Administered 2018-03-30: 0.1 mg via ORAL
  Filled 2018-03-28: qty 1

## 2018-03-28 MED ORDER — INSULIN GLARGINE 100 UNIT/ML ~~LOC~~ SOLN
5.0000 [IU] | Freq: Once | SUBCUTANEOUS | Status: AC
  Administered 2018-03-28: 5 [IU] via SUBCUTANEOUS
  Filled 2018-03-28: qty 0.05

## 2018-03-28 MED ORDER — CLONIDINE HCL 0.1 MG PO TABS
0.1000 mg | ORAL_TABLET | Freq: Two times a day (BID) | ORAL | Status: AC
  Administered 2018-03-29 (×2): 0.1 mg via ORAL
  Filled 2018-03-28 (×2): qty 1

## 2018-03-28 MED ORDER — HYDRALAZINE HCL 20 MG/ML IJ SOLN: 10.0000 mg | INTRAMUSCULAR | Status: DC | PRN

## 2018-03-28 MED ORDER — AMLODIPINE BESYLATE 5 MG PO TABS
5.0000 mg | ORAL_TABLET | Freq: Every day | ORAL | Status: DC
  Administered 2018-03-28 – 2018-03-30 (×3): 5 mg via ORAL
  Filled 2018-03-28 (×4): qty 1

## 2018-03-28 MED ORDER — INSULIN GLARGINE 100 UNIT/ML ~~LOC~~ SOLN
15.0000 [IU] | Freq: Every day | SUBCUTANEOUS | Status: DC
  Administered 2018-03-29 – 2018-03-30 (×2): 15 [IU] via SUBCUTANEOUS
  Filled 2018-03-28 (×2): qty 0.15

## 2018-03-28 NOTE — Progress Notes (Signed)
Nutrition Follow-up  DOCUMENTATION CODES:   Obesity unspecified  INTERVENTION:   Ensure Enlive po BID, each supplement provides 350 kcal and 20 grams of protein   NUTRITION DIAGNOSIS:   Inadequate oral intake related to poor appetite as evidenced by per patient/family report. Ongoing.   GOAL:   Patient will meet greater than or equal to 90% of their needs, Provide needs based on ASPEN/SCCM guidelines Progressing.   MONITOR:   PO intake, Supplement acceptance  REASON FOR ASSESSMENT:   Consult Enteral/tube feeding initiation and management  ASSESSMENT:   53 year old male [facility] for [facility] with septic shock and renal failure.  Patient originally presented on 1/1 with abdominal pain, nausea, vomiting.  His lipase was elevated at 763.  Findings included edema and fluid surrounding the pancreas consistent with pancreatitis. Pt decompensated, developing shock requiring vasopressors.  Developed respiratory failure requiring intubation. Patient had progressive renal failure and started on CRRT on 1/3.  1/3 - 1/6 CRRT 1/7 extubated 1/8 adv diet  Pt discussed during ICU rounds and with RN.  Pt with very flat affect. Per RN pt with DM and HTN which he is noncompliant with. DM coordinator to follow up with pt.  Pt reports poor appetite. Noted lunch untouched on tray table.   Medications reviewed and include: folic acid, SSI, thiamine  novolog 2 units with meals, 15 units lantus daily  Labs reviewed CBG (last 3)  Recent Labs    03/28/18 1214 03/28/18 1720 03/28/18 2200  GLUCAP 165* 163* 172*    Lab Results  Component Value Date   HGBA1C 12.3 (H) 11/20/2017   Diet Order:   Diet Order            Diet Carb Modified Fluid consistency: Thin; Room service appropriate? Yes  Diet effective now              EDUCATION NEEDS:   Not appropriate for education at this time  Skin:  Skin Assessment: Reviewed RN  Assessment  Last BM:  1/4  Height:   Ht Readings from Last 1 Encounters:  03/28/18 5\' 9"  (1.753 m)    Weight:   Wt Readings from Last 1 Encounters:  03/29/18 97.9 kg    Ideal Body Weight:  72.7 kg  BMI:  Body mass index is 31.87 kg/m.  Estimated Nutritional Needs:   Kcal:  [id number]  Protein:  145-181 grams  Fluid:  >/= 1.6 L/day  Kendell BaneHeather Shelli Portilla RD, LDN, CNSC [phone] Pager [phone] After Hours Pager

## 2018-03-28 NOTE — Progress Notes (Signed)
   03/28/18 0400  Provider Notification  Provider Name/Title Dr. Valora PiccoloYap  Date Provider Notified 03/28/18  Time Provider Notified 0400  Notification Type Call  Notification Reason Change in status  Response See new orders (Amlodipine for hypertension)  Date of Provider Response 03/28/18  Time of Provider Response [phone]

## 2018-03-28 NOTE — Progress Notes (Signed)
eLink Physician-Brief Progress Note Patient Name: Randy Deleon DOB: [birth year] MRN: [id number]   Date of Service  03/28/2018  HPI/Events of Note  Pt previously on pressors but is now hypertensive with SBP in the 170s.  He has not been on oral antihypertensives.  eICU Interventions  Start on amlodipine 5mg  daily.     Intervention Category Intermediate Interventions: Hypertension - evaluation and management  Larinda Buttery 03/28/2018, 4:00 AM

## 2018-03-28 NOTE — Plan of Care (Signed)
Pt has progressed to a carb modified diet, he still doesn't have much of a appetite.  Will continue to monitor  Jaclyn Shaggy RN

## 2018-03-28 NOTE — Progress Notes (Signed)
NAME:  Randy Deleon, MRN:  [id number], DOB:  April 12, 1965, LOS: 5 ADMISSION DATE:  03/23/2018, CONSULTATION DATE:  03/23/2018 REFERRING MD:  Belia Heman - [facility], CHIEF COMPLAINT:  pancreatitis   HPI  53 year old man admitted with abdominal pain from alcoholic pancreatitis. Transferred from [facility] for MSOF requiring mechanical ventilation and CRRT.  Past Medical History  DM., HTN  Hospital Course   1/4: Transferred to [facility] , nephrology consulted, CRRT resumed.  This had been initially started on 1/3 at [facility] 1/5: Vasopressors weaned off.  Tolerating fluid removal.  Antibiotics stopped.  Insulin drip discontinued.  Sedation regimen changed to Precedex with decreasing RASS goal 1/6: Initiating weaning attempts, CRRT discontinued when filter clotted 1/7: Extubated successfully 1/8: ongoing confused, weaning precedex- off; advance diet per SLP  Interim history/subjective:  Off Precedex since 1/8 ~1300, improving confusion Afebrile, normo to mildly hypertensive, stable on 2L [place] Patient reports poor appetite, mild nausea, no reports of vomiting  Objective   Blood pressure (!) 166/84, pulse 95, temperature 98.8 F (37.1 C), temperature source Oral, resp. rate (!) 33, height 5\' 9"  (1.753 m), weight 106.3 kg, SpO2 96 %.        Intake/Output Summary (Last 24 hours) at 03/28/2018 0942 Last data filed at 03/28/2018 0500 Gross per 24 hour  Intake 177.94 ml  Output 1220 ml  Net -1042.06 ml   Filed Weights   03/26/18 0500 03/27/18 0435 03/28/18 0418  Weight: 108.6 kg 106.3 kg 106.3 kg   Physical exam General:  Adult male lying in bed in NAD HEENT: MM pink/moist, pupils 4/reactive, no JVD Neuro: Alert, oriented to time/ place but intermittently seems confused  CV: rrr, no m/r/g PULM: even/non-labored, lungs bilaterally clear GI: protuberant, soft, nontender, +BS Extremities: warm/dry, trace LE edema  Skin: no rashes     Distributive shock 2/2 pancreatitis   Assessment & Plan:     Acute Hypoxic Respiratory Failure 2/2 ARDS from pancreatitis with superimposed volume overload. -Successfully extubated 1/7 Plan Wean oxygen for goal sat > 94 Mobilize w/PT, IS Aspiration precautions  Acute alcoholic pancreatitis -lipase trending down 763 -> 606-> 152 Plan Advance diet per SLP recs Lipase in am  Alcohol cessation  Acute metabolic Encephalopathy - off precedex 1/8 w/ clonidine taper  Plan Continue clonidine taper Daily folic acid And thiamine  AKI  -renal fxn normalized  -Off CRRT since 1/6 Plan Trend renal panel/ UOP Renally adjust medications as appropriate  Fluid and electrolyte imbalance Plan Trend   On-going SIRS w/ Leukocytosis (suspect more 2/2 pancreatitis) - cultures neg Plan Trend fever curve- remains afebrile  Cbc and lipase in am   DM Plan SSI resistant  Increase Lantus to 15 units daily  Continue meal coverage 2 units w/meals  Hypertension P:  norvasc 5 mg started 1/9 On clonidine taper as above  Add prn apresoline for SBP > 180  Overall deconditioning P:  PT recs for CIR  Dysphagia P:  SLP following, recs for dysphagia 3, mild aspiration risk  Constipation P:  Add dulcolax and miralax prn   Best practice:  Diet: start PO diet 1/8- dysphagia 3 Pain/Anxiety/Delirium protocol (if indicated): PAD protocol discontinued 1/7, off Precedex 1/8 and clonidine protocol 1/8 VAP protocol (if indicated): Discontinued 1/7 DVT prophylaxis: heparin tid GI prophylaxis: pantoprazole, discontinued 1/7 Glucose control: SSI resistant; lantus; meal coverage Mobility: bedrest- advance per PT Code Status: full Family Communication: No family present 1/9  As patient remains hemodynamically stable and off precedex, will transfer  to medical telemetry.  [facility]  Critical care time:     Posey BoyerBrooke , MSN, AGACNP-BC [place] [facility] Pgr: [phone] or if no answer [phone] 03/28/2018, 10:20  AM

## 2018-03-28 NOTE — Progress Notes (Signed)
  Speech Language Pathology Treatment: Dysphagia  Patient Details Name: Randy Deleon MRN: [id number] DOB: 29-Jul-1965 Today's Date: 03/28/2018 Time: 0539-7673 SLP Time Calculation (min) (ACUTE ONLY): 16 min  Assessment / Plan / Recommendation Clinical Impression  Pt's responses somewhat less delayed today. Insight into severity of deficits is decreased. Consumption of multiple straw sips thin yielded delayed throat clear x 1 with unremarkable oral phase with upgraded regular texture. Pt states he as coughed some with eating/drinking. Recommend upgrade to regular, continue thin, pills with thin and ST will follow up once more to ensure safety with recommendations.    HPI HPI: 53 year old man admitted with abdominal pain from alcoholic pancreatitis. 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      SLP Plan  All goals met       Recommendations  Diet recommendations: Regular;Thin liquid Liquids provided via: Cup;Straw Medication Administration: Whole meds with liquid Supervision: Patient able to self feed Compensations: Slow rate;Small sips/bites Postural Changes and/or Swallow Maneuvers: Seated upright 90 degrees                Oral Care Recommendations: Oral care BID Follow up Recommendations: None SLP Visit Diagnosis: Dysphagia, unspecified (R13.10) Plan: All goals met       GO                Houston Siren 03/28/2018, 1:29 PM  Orbie Pyo Epifania Littrell M.Ed Risk analyst [phone] Office [phone]

## 2018-03-28 NOTE — Progress Notes (Addendum)
Spoke with patient about his diabetes. States that he was diagnosed "a while ago" and was on oral medications Invokana and Kombiglyze XR (Saxagliptin and Metformin) at home for his diabetes. States that he probably had missed a few doses of his medications before his admission. States that he sees a PCP for his diabetes. Per H & P, patient does drink alcohol regularly.   Noted that patient takes Kombiglyze and Invokana as an outpatient.  Kombiglyze can cause pancreatitis and Invokana can increase risk of developing DKA. Therefore, would strongly recommend Kombiglyze and Invokana be discontinued as an outpatient and that patient be discharged on insulin for DM control. Patient states that he does not want to take insulin at home. Explained to the patient that oral agents "may not be effective due to the pancreas being irritated and having to work extra hard". The last HgbA1C in September, 2019 was 12.3%. Patient states that he checks blood sugars at home, but not routinely. States that the blood sugars usually run 180-200's mg/dl.  Will need to be followed closely as outpatient for blood glucose control. Will continue to monitor blood sugars while in the hospital.  Smith Mince RN BSN CDE Diabetes Coordinator Pager: [phone]  8am-5pm

## 2018-03-29 DIAGNOSIS — R5381 Other malaise: Secondary | ICD-10-CM

## 2018-03-29 LAB — CBC
HCT: 40.6 % (ref 39.0–52.0)
HEMOGLOBIN: 13.1 g/dL (ref 13.0–17.0)
MCH: 32.3 pg (ref 26.0–34.0)
MCHC: 32.3 g/dL (ref 30.0–36.0)
MCV: 100 fL (ref 80.0–100.0)
Platelets: 194 10*3/uL (ref 150–400)
RBC: 4.06 MIL/uL — ABNORMAL LOW (ref 4.22–5.81)
RDW: 13.2 % (ref 11.5–15.5)
WBC: 17.2 10*3/uL — ABNORMAL HIGH (ref 4.0–10.5)
nRBC: 0 % (ref 0.0–0.2)

## 2018-03-29 LAB — RENAL FUNCTION PANEL
Albumin: 2.2 g/dL — ABNORMAL LOW (ref 3.5–5.0)
Anion gap: 14 (ref 5–15)
BUN: 20 mg/dL (ref 6–20)
CHLORIDE: 105 mmol/L (ref 98–111)
CO2: 21 mmol/L — ABNORMAL LOW (ref 22–32)
Calcium: 8.6 mg/dL — ABNORMAL LOW (ref 8.9–10.3)
Creatinine, Ser: 0.86 mg/dL (ref 0.61–1.24)
GFR calc Af Amer: >60 mL/min (ref >60)
GFR calc non Af Amer: >60 mL/min (ref >60)
Glucose, Bld: 172 mg/dL — ABNORMAL HIGH (ref 70–99)
POTASSIUM: 4.1 mmol/L (ref 3.5–5.1)
Phosphorus: 3.6 mg/dL (ref 2.5–4.6)
Sodium: 140 mmol/L (ref 135–145)

## 2018-03-29 LAB — GLUCOSE, CAPILLARY
Glucose-Capillary: 194 mg/dL — ABNORMAL HIGH (ref 70–99)
Glucose-Capillary: 173 mg/dL — ABNORMAL HIGH (ref 70–99)
Glucose-Capillary: 170 mg/dL — ABNORMAL HIGH (ref 70–99)
Glucose-Capillary: 115 mg/dL — ABNORMAL HIGH (ref 70–99)

## 2018-03-29 LAB — LIPASE, BLOOD: Lipase: 39 U/L (ref 11–51)

## 2018-03-29 LAB — MAGNESIUM: Magnesium: 1.8 mg/dL (ref 1.7–2.4)

## 2018-03-29 MED ORDER — ENSURE ENLIVE PO LIQD
237.0000 mL | Freq: Two times a day (BID) | ORAL | Status: DC
  Administered 2018-03-29 – 2018-03-30 (×3): 237 mL via ORAL

## 2018-03-29 NOTE — Consult Note (Signed)
Physical Medicine and Rehabilitation Consult  Reason for Consult: Debility Referring Physician: Dr. Catha GosselinMikhail   HPI: Randy MeckelKevin W Rabel is a 53 y.o. male with history of T2DM, tobacco abuse, alcohol abuse who was [facility] with N/V and abdominal pain due to pancreatitis. He was treated for diabetic ketoacidosis but declined requiring pressors as well as intubation due to septic shock with ARDs. He was stared on CRRT due to AKI and there were concerns of abdominal compartment syndrome therefore [facility] on 03[facility]23/[facility]. He ws treated with broad spectrum antibiotics as well as steroids and fluid overload managed with CRRT. Antibiotics and pressors weaned off and tolerated extubation by  1/7. Therapy evaluations completed 01/8 revealing deficits in mobility and ADLs. CIR recommended for follow up therapy.    Review of Systems  Constitutional: Negative for fever.  HENT: Negative for tinnitus.   Eyes: Negative for double vision.  Respiratory: Negative for hemoptysis.   Cardiovascular: Negative for palpitations.  Gastrointestinal: Negative for nausea.  Genitourinary: Negative for urgency.  Musculoskeletal: Positive for back pain and joint pain. Negative for neck pain.  Skin: Negative for rash.  Neurological: Positive for focal weakness and weakness.  Psychiatric/Behavioral: Negative for suicidal ideas.      Past Medical History:  Diagnosis Date  . Diabetes mellitus without complication (HCC)   . Hypertension     Past Surgical History:  Procedure Laterality Date  . none      Family History  Problem Relation Age of Onset  . Diabetes Mother   . Hypertension Mother   . Congestive Heart Failure Father   . Diabetes Father   . Hypertension Father     Social History: Lives with family. 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 He reports that he has been smoking cigarettes--1.2 PPD. He started smoking about 21 years ago. He has a 15.00 pack-year  smoking history. He has never used smokeless tobacco. He reports alcohol use--liquor about 4 times a week--more on the weekends.  He reports previous drug use.   Allergies  Allergen Reactions  . Contrast Media [Iodinated Diagnostic Agents]     Renal failure  . Other Rash and Other (See Comments)    Tested allergic to LIMA BEANS  . Shellfish Allergy Rash and Other (See Comments)    TESTED ALLERGIC TO "SHELLFISH"   Medications Prior to Admission  Medication Sig Dispense Refill  . canagliflozin (INVOKANA) 300 MG TABS tablet Take 300 mg by mouth daily before breakfast.    . Cholecalciferol (VITAMIN D3) 125 MCG (5000 UT) CAPS Take 5,000 Units by mouth daily.    Bess Harvest. Icosapent Ethyl (VASCEPA) 1 g CAPS Take 2 g by mouth 2 (two) times daily.    . nicotine (NICODERM CQ - DOSED IN MG/24 HOURS) 21 mg/24hr patch Place 1 patch (21 mg total) onto the skin daily. 28 patch 0  . ondansetron (ZOFRAN) 4 MG tablet Take 1 tablet (4 mg total) by mouth every 6 (six) hours as needed for nausea. 20 tablet 0  . Saxagliptin-Metformin (KOMBIGLYZE XR) 07-998 MG TB24 Take 1 tablet by mouth 2 (two) times daily.      Home: Home Living Family/patient expects to be discharged to:: Private residence Living Arrangements: Children(daughter) Available Help at Discharge: Family, Available PRN/intermittently Type of Home: House Home Access: Stairs to enter [employer] CorporationEntrance Stairs-Number of Steps: 2 Home Layout: One level Bathroom Shower/Tub: Engineer, manufacturing systemsTub/shower unit Bathroom Toilet: Standard Home Equipment: None Additional Comments: pt verbally agreed to  home living section. pt's voice is very low.  Functional History: Prior Function Level of Independence: Independent Comments: works Functional Status:  Mobility: Bed Mobility Overal bed mobility: Needs Assistance Bed Mobility: Supine to Sit Supine to sit: Mod assist, HOB elevated General bed mobility comments: pt on commode and returned to recliner  Transfers Overall transfer  level: Needs assistance Equipment used: Rolling walker (2 wheeled) Transfers: Sit to/from Stand, [employer] Transfers Sit to Stand: Min assist Stand pivot transfers: Min assist General transfer comment: MinA to minguard Ambulation/Gait General Gait Details: unable to safely today.     ADL: ADL Overall ADL's : Needs assistance/impaired Eating/Feeding: Set up Grooming: Wash/dry hands, Wash/dry face, Oral care, Min guard, Standing, Cueing for safety, Cueing for sequencing Upper Body Bathing: Standing, Min guard Lower Body Bathing: Sit to/from stand, Moderate assistance Upper Body Dressing : Minimal assistance, Standing Lower Body Dressing: Moderate assistance, Sitting/lateral leans, Sit to/from stand Toilet Transfer: Min guard, Cueing for safety, Cueing for sequencing, Regular Toilet, Grab bars Toileting- Clothing Manipulation and Hygiene: Minimal assistance, Moderate assistance, Sitting/lateral lean, Sit to/from stand, Cueing for safety Functional mobility during ADLs: Min guard, Rolling walker General ADL Comments: requires assist for sequencing. Pt is so quiet and often has no response so unable to determine if he can understand command  Cognition: Cognition Overall Cognitive Status: Impaired/Different from baseline Orientation Level: Oriented to person, Oriented to place, Oriented to time Cognition Arousal/Alertness: Awake/alert Behavior During Therapy: Flat affect Overall Cognitive Status: Impaired/Different from baseline Area of Impairment: Following commands, Problem solving Following Commands: Follows one step commands with increased time Problem Solving: Slow processing General Comments: Flat affect   Blood pressure (!) 155/85, pulse 96, temperature 98.3 F (36.8 C), temperature source Oral, resp. rate (!) 36, height 5\' 9"  (1.753 m), weight 97.9 kg, SpO2 95 %. Physical Exam  Nursing note and vitals reviewed. Constitutional: He is oriented to person, place, and time.  He appears well-developed and well-nourished.  Sitting at EOB. NAD.   Neurological: He is alert and oriented to person, place, and time.  Soft voice. Able to follow commands without difficulty.     Results for orders placed or performed during the hospital encounter of 03/23/18 (from the past 24 hour(s))  Glucose, capillary     Status: Abnormal   Collection Time: 03/28/18 12:14 PM  Result Value Ref Range   Glucose-Capillary 165 (H) 70 - 99 mg/dL   Comment 1 Notify RN    Comment 2 Document in Chart   Glucose, capillary     Status: Abnormal   Collection Time: 03/28/18  5:20 PM  Result Value Ref Range   Glucose-Capillary 163 (H) 70 - 99 mg/dL  Glucose, capillary     Status: Abnormal   Collection Time: 03/28/18 10:00 PM  Result Value Ref Range   Glucose-Capillary 172 (H) 70 - 99 mg/dL  Renal function panel (daily at 0500)     Status: Abnormal   Collection Time: 03/29/18  5:47 AM  Result Value Ref Range   Sodium 140 135 - 145 mmol/L   Potassium 4.1 3.5 - 5.1 mmol/L   Chloride 105 98 - 111 mmol/L   CO2 21 (L) 22 - 32 mmol/L   Glucose, Bld 172 (H) 70 - 99 mg/dL   BUN 20 6 - 20 mg/dL   Creatinine, Ser 1.61 0.61 - 1.24 mg/dL   Calcium 8.6 (L) 8.9 - 10.3 mg/dL   Phosphorus 3.6 2.5 - 4.6 mg/dL   Albumin 2.2 (L) 3.5 - 5.0  g/dL   GFR calc non Af Amer >60 >60 mL/min   GFR calc Af Amer >60 >60 mL/min   Anion gap 14 5 - 15  Magnesium     Status: None   Collection Time: 03/29/18  5:47 AM  Result Value Ref Range   Magnesium 1.8 1.7 - 2.4 mg/dL  CBC     Status: Abnormal   Collection Time: 03/29/18  5:47 AM  Result Value Ref Range   WBC 17.2 (H) 4.0 - 10.5 K/uL   RBC 4.06 (L) 4.22 - 5.81 MIL/uL   Hemoglobin 13.1 13.0 - 17.0 g/dL   HCT 23.5 57.3 - 22.0 %   MCV 100.0 80.0 - 100.0 fL   MCH 32.3 26.0 - 34.0 pg   MCHC 32.3 30.0 - 36.0 g/dL   RDW 25.4 27.0 - 62.3 %   Platelets 194 150 - 400 K/uL   nRBC 0.0 0.0 - 0.2 %  Lipase, blood     Status: None   Collection Time: 03/29/18  5:47 AM    Result Value Ref Range   Lipase 39 11 - 51 U/L  Glucose, capillary     Status: Abnormal   Collection Time: 03/29/18  8:29 AM  Result Value Ref Range   Glucose-Capillary 194 (H) 70 - 99 mg/dL   No results found.   Assessment/Plan: Diagnosis: debility related to pancreatitis and multiple medical 1. Does the need for close, 24 hr/day medical supervision in concert with the patient's rehab needs make it unreasonable for this patient to be served in a less intensive setting? No 2. Co-Morbidities requiring supervision/potential complications:   3. Due to bladder management, bowel management, safety, skin/wound care, disease management, medication administration, pain management and patient education, does the patient require 24 hr/day rehab nursing? No 4. Does the patient require coordinated care of a physician, rehab nurse,   to address physical and functional deficits in the context of the above medical diagnosis(es)? No Addressing deficits in the following areas: balance, endurance, locomotion, strength, transferring, bowel/bladder control, bathing, dressing, feeding and psychosocial support 5. Can the patient actively participate in an intensive therapy program of at least 3 hrs of therapy per day at least 5 days per week? Yes 6. The potential for patient to make measurable gains while on inpatient rehab is fair 7. Anticipated functional outcomes upon discharge from inpatient rehab are n/a  with PT, n/a with OT, n/a with SLP. 8. Estimated rehab length of stay to reach the above functional goals is: n/a 9. Anticipated D/C setting: Home 10. Anticipated post D/C treatments: HH therapy 11. Overall Rehab/Functional Prognosis: excellent  RECOMMENDATIONS: This patient's condition is appropriate for continued rehabilitative care in the following setting: [facility] Patient has agreed to participate in recommended program. Yes Note that insurance prior authorization may be required for  reimbursement for recommended care.  Comment: Spoke with pt/family at length.  I have personally performed a face to face diagnostic evaluation of this patient and formulated the key components of the plan.  Additionally, I have personally reviewed laboratory data, imaging studies, as well as relevant notes and concur with the physician assistant's documentation above.  Ranelle Oyster, MD, [place]    Jacquelynn Cree, PA-C 03/29/2018

## 2018-03-29 NOTE — Progress Notes (Signed)
Patient refusing to be educated about insulin administartion. Attempted X 2

## 2018-03-29 NOTE — Progress Notes (Signed)
IP rehab admissions - Please see rehab consult done today by Dr. Riley Kill recommending [facility] therapies.  Patient wants to go home.  He was up walking in the hall when Dr. Riley Kill did his evaluation.  Recommend home with [facility] therapies.  Call me for questions.  [phone]

## 2018-03-29 NOTE — Progress Notes (Signed)
Physical Therapy Treatment Patient Details Name: Randy MeckelKevin W Deleon MRN: [id number] DOB: 1965/07/29 Today's Date: 03/29/2018    History of Present Illness 53 y.o. male admitted [facility] 03/23/[facility] to [facility] due to N/V and abdominal pain.  He was dx there with acute pancreatitis and diabetic ketoacidosis.  His medical conditioned worsened requiring transfer to the ICU, intubation, vasopressors, and CCRT due to renal failure.  General surgery was consulted due to suspicion of abdominal compartment syndrome.  [facility] was [facility] for a higher level of care.  Pt extubated 03/26/18.  Pt formally dx with acute hypoxic respiratory failure secondary to ARDS from pancreatitis with superimposed volume overload, acute alcoholic pancreatitis, acute metabolic encephalopathy, acute kidney injury, fluid and electrolyte imbalance, and on going SIRS with leukocytosis.  Pt with significant PMH of HTN and DM.     PT Comments    Patient seen for mobility progression. Pt is progressing toward PT goals and tolerated gait training distance of 200 ft with min guard assist. Pt continues to be flat and not very conversant but not sure of pt's baseline cognition. Pt answering questions appropriately but with some delay. Pt will continue to benefit from further skilled PT services to maximize independence and safety with mobility.    Follow Up Recommendations  Home health PT;Supervision for mobility/OOB     Equipment Recommendations  Rolling walker with 5" wheels;3in1 (PT)    Recommendations for Other Services       Precautions / Restrictions Precautions Precautions: Fall    Mobility  Bed Mobility               General bed mobility comments: pt sitting EOB upon arrival  Transfers Overall transfer level: Needs assistance Equipment used: Rolling walker (2 wheeled) Transfers: Sit to/from Stand Sit to Stand: Min guard         General transfer comment: min guard for safety  Ambulation/Gait Ambulation/Gait  assistance: Min guard Gait Distance (Feet): 200 Feet Assistive device: Rolling walker (2 wheeled) Gait Pattern/deviations: Step-through pattern     General Gait Details: min guard for safety; decreased cadence and need for UE support to steady   Stairs             Wheelchair Mobility    Modified Rankin (Stroke Patients Only)       Balance Overall balance assessment: Needs assistance Sitting-balance support: Feet supported;No upper extremity supported Sitting balance-Leahy Scale: Good     Standing balance support: Single extremity supported Standing balance-Leahy Scale: Fair                              Cognition Arousal/Alertness: Awake/alert Behavior During Therapy: Flat affect Overall Cognitive Status: No family/caregiver present to determine baseline cognitive functioning Area of Impairment: Following commands;Problem solving                       Following Commands: Follows one step commands with increased time     Problem Solving: Slow processing General Comments: Flat affect      Exercises      General Comments General comments (skin integrity, edema, etc.): family present in room      Pertinent Vitals/Pain Pain Assessment: Faces Faces Pain Scale: No hurt Pain Location: not specified Pain Descriptors / Indicators: Discomfort Pain Intervention(s): Monitored during session    Home Living  Prior Function            PT Goals (current goals can now be found in the care plan section) Progress towards PT goals: Progressing toward goals    Frequency    Min 3X/week      PT Plan Discharge plan needs to be updated    Co-evaluation              AM-PAC PT "6 Clicks" Mobility   Outcome Measure  Help needed turning from your back to your side while in a flat bed without using bedrails?: A Little Help needed moving from lying on your back to sitting on the side of a flat bed without  using bedrails?: A Little Help needed moving to and from a bed to a chair (including a wheelchair)?: A Little Help needed standing up from a chair using your arms (e.g., wheelchair or bedside chair)?: None Help needed to walk in hospital room?: A Little Help needed climbing 3-5 steps with a railing? : A Little 6 Click Score: 19    End of Session Equipment Utilized During Treatment: Gait belt Activity Tolerance: Patient tolerated treatment well Patient left: with call bell/phone within reach;with family/visitor present;Other (comment)(sitting EOB) Nurse Communication: Mobility status PT Visit Diagnosis: Muscle weakness (generalized) (M62.81);Difficulty in walking, not elsewhere classified (R26.2)     Time: 8563-1497 PT Time Calculation (min) (ACUTE ONLY): 11 min  Charges:  $Gait Training: 8-22 mins                     Erline Levine, PTA Acute Rehabilitation Services Pager: [phone] Office: [phone]     Carolynne Edouard 03/29/2018, 1:12 PM

## 2018-03-29 NOTE — Care Management Note (Addendum)
Case Management Note  Patient Details  Name: Louis MeckelKevin W Hensch MRN: [id number] Date of Birth: 1966/03/07  Subjective/Objective:                    Action/Plan:  PT recommendations home health PT , 3 in 1 and walker. Discussed with patient. Patient feels he does not need home health PT at this time. If he changes his mind he will contact his PCP to arrange. Also, patient has access to 3 in 1 and walker and does not want NCM to order.  Mother will provide transportation home. Expected Discharge Date:                  Expected Discharge Plan:  Home w Home Health Services  In-House Referral:     Discharge planning Services  CM Consult  Post Acute Care Choice:  Home Health, Durable Medical Equipment Choice offered to:  Patient  DME Arranged:  3-N-1, Walker rolling DME Agency:  NA  HH Arranged:  PT, Patient Refused HH HH Agency:  NA  Status of Service:  Completed, signed off  If discussed at Long Length of Stay Meetings, dates discussed:    Additional Comments:  Kingsley PlanWile, Adalyn Pennock Marie, RN 03/29/2018, 2:08 PM

## 2018-03-29 NOTE — Progress Notes (Signed)
PROGRESS NOTE    Randy Deleon  ZOX:[id number] DOB: [birth year] DOA: 03/23/2018 PCP: Sherron Monday, MD   Brief Narrative:  HPI on 03/23/2018 by Dr. [place] Kloefkorn (PCCM) 53 year old male history of diabetes mellitus, alcohol abuse, who presented to [facility] with complaints of nausea, vomiting and abdominal pain.  There he was diagnosed with acute pancreatitis based on a CT of the abdomen.  He was also treated for diabetic ketoacidosis based on elevated blood glucose and anion gap.  He was initially admitted to the floor however he worsened requiring transfer to the ICU, intubation, 2 vasopressors and maximum ventilator support.  He developed renal failure requiring initiation of CRRT for which general surgery was consulted for possible abdominal compartment syndrome.  They evaluated him and noted that his abdominal pressure was 12 cmH2O and did not believe that he had abdominal compartment syndrome at that time however were concerned should he deteriorate that he should be at a higher level of care for which she was transferred to the [facility] MICU.    Interim history Admitted to PCCM service for alcoholic pancreatitis and multiorgan failure.  Patient was intubated for a brief time and also required pressors, as well as Precedex.  He was noted to have renal failure and required CRRT.  At this time, patient is currently improving and has been extubated and transferred to [facility] service.  He was evaluated by physical therapy as well as inpatient rehab, both of which recommended home health. Assessment & Plan   Acute hypoxic respiratory failure -Secondary to ARDS from pancreatitis with superimposed volume overload -Patient was intubated, successfully extubated on 03/26/2018 -Currently on room air maintaining oxygen saturations in the 90s  Acute alcoholic pancreatitis -Lipase trending down, was 763 on admission- down to 39 -Currently tolerating a regular diet -Continue  multivitamin, folic acid and thiamine -Discussed alcohol cessation  Acute metabolic encephalopathy -Patient required Precedex drip with clonidine taper.  Precedex was discontinued on 03/27/2018 -Continue to taper clonidine -Suspect secondary to the above  Acute kidney injury -Patient required CRRT, but has not had a session since 03/25/2018 -Acute kidney injury has resolved  Fluid and electrolyte imbalance -Stable  Diabetes mellitus, type II -Continue Lantus, insulin sliding scale with CBG monitoring -Diabetes coordinator urged patient to discontinue his home medications given his hemoglobin A1c of 12.3 on 11/20/2017 -Patient refuses to be on insulin -Need to follow-up closely with PCP  Deconditioning -Initially seen by physical therapy, and CIR was recommended. -Today however CIR did evaluate patient and feels that he could be discharged home with home health. -Physical therapy now agrees -Case management consulted  Dysphasia -Improving, patient being followed by speech therapy who recommended a regular diet on 03/28/2018  Constipation -Continue bowel regimen  Leukocytosis -Suspect secondary to pancreatitis and the above processes -Trending downward -Blood cultures show no growth to date -Respiratory culture shows normal respiratory flora  DVT Prophylaxis  Heparin  Code Status: Full  Family Communication: Daughter at bedside  Disposition Plan: Admitted. Likely discharge to home on 03/30/2018 with home health  Consultants PCCM Nephrology Inpatient rehab  Procedures / significant events 1/4: Transferred to [facility] , nephrology consulted, CRRT resumed.  This had been initially started on 1/3 at [facility] 1/5: Vasopressors weaned off.  Tolerating fluid removal.  Antibiotics stopped.  Insulin drip discontinued.  Sedation regimen changed to Precedex with decreasing RASS goal 1/6: Initiating weaning attempts, CRRT discontinued when filter clotted 1/7: Extubated  successfully  Antibiotics   Anti-infectives (  From admission, onward)   Start     Dose/Rate Route Frequency Ordered Stop   03/23/18 2200  ceFEPIme (MAXIPIME) 2 g in sodium chloride 0.9 % 100 mL IVPB  Status:  Discontinued     2 g 200 mL/hr over 30 Minutes Intravenous Every 12 hours 03/23/18 1345 03/24/18 1432   03/23/18 1000  ceFEPIme (MAXIPIME) 1 g in sodium chloride 0.9 % 100 mL IVPB  Status:  Discontinued     1 g 200 mL/hr over 30 Minutes Intravenous Every 12 hours 03/23/18 0254 03/23/18 1345   03/23/18 0600  metroNIDAZOLE (FLAGYL) IVPB 500 mg  Status:  Discontinued     500 mg 100 mL/hr over 60 Minutes Intravenous Every 8 hours 03/23/18 0254 03/24/18 1432      Subjective:   Randy Deleon seen and examined today.  Complains of weakness.  Denies current chest pain, shortness breath, abdominal pain, nausea or vomiting, diarrhea or constipation, dizziness or headache.  Objective:   Vitals:   03/29/18 0241 03/29/18 0442 03/29/18 0446 03/29/18 1354  BP: (!) 158/71  (!) 155/85 (!) 168/95  Pulse: 80  96 (!) 103  Resp: (!) 32  (!) 36 17  Temp: 99.1 F (37.3 C)  98.3 F (36.8 C) 99.7 F (37.6 C)  TempSrc: Oral  Oral Oral  SpO2: 93%  95% 97%  Weight:  97.9 kg    Height:        Intake/Output Summary (Last 24 hours) at 03/29/2018 1427 Last data filed at 03/29/2018 0244 Gross per 24 hour  Intake 666 ml  Output 900 ml  Net -234 ml   Filed Weights   03/28/18 0418 03/28/18 1450 03/29/18 0442  Weight: 106.3 kg 106.6 kg 97.9 kg    Exam  General: Well developed, well nourished, NAD, appears stated age  HEENT: NCAT, mucous membranes moist.   Neck: Supple  Cardiovascular: S1 S2 auscultated, no rubs, murmurs or gallops. Regular rate and rhythm.  Respiratory: Clear to auscultation bilaterally with equal chest rise  Abdomen: Soft, nontender, nondistended, + bowel sounds  Extremities: warm dry without cyanosis clubbing or edema  Neuro: AAOx3, nonfocal  Psych: Normal affect  and demeanor   Data Reviewed: I have personally reviewed following labs and imaging studies  CBC: Recent Labs  Lab 03/23/18 0509 03/24/18 0411 03/25/18 0504 03/26/18 0551 03/27/18 0430 03/29/18 0547  WBC 12.2* 8.6 8.2 14.0* 19.9* 17.2*  NEUTROABS 10.0*  --   --   --   --   --   HGB 14.1 13.1 13.7 13.4 13.3 13.1  HCT 43.8 39.5 40.5 40.5 40.2 40.6  MCV 100.9* 99.5 100.2* 101.8* 100.0 100.0  PLT 50* 51* 67* 106* 157 194   Basic Metabolic Panel: Recent Labs  Lab 03/25/18 0549 03/25/18 1630 03/26/18 0500 03/26/18 0551 03/27/18 0430 03/28/18 0453 03/29/18 0547  NA 136 136 138 138 145 143 140  K 3.8 3.8 4.1 4.1 4.1 3.7 4.1  CL 104 101 104 104 113* 109 105  CO2 24 24 23 24 25 23  21*  GLUCOSE 222* 189* 279* 279* 179* 208* 172*  BUN 35* 33* 56* 56* 44* 32* 20  CREATININE 1.17 1.14 1.68* 1.70* 1.24 1.09 0.86  CALCIUM 8.0* 8.5* 8.5* 8.5* 8.6* 8.6* 8.6*  MG 2.5*  --   --  2.7* 2.5* 2.0 1.8  PHOS 1.7* 1.8* 3.2  --  2.3* 3.0 3.6   GFR: Estimated Creatinine Clearance: 118.6 mL/min (by C-G formula based on SCr of 0.86 mg/dL). Liver Function Tests:  Recent Labs  Lab 03/23/18 0509  03/25/18 1630 03/26/18 0500 03/27/18 0430 03/28/18 0453 03/29/18 0547  AST 50*  --   --   --   --   --   --   ALT 37  --   --   --   --   --   --   ALKPHOS 65  --   --   --   --   --   --   BILITOT 1.2  --   --   --   --   --   --   PROT 5.2*  --   --   --   --   --   --   ALBUMIN 1.9*   < > 2.3* 1.9* 1.8* 1.9* 2.2*   < > = values in this interval not displayed.   Recent Labs  Lab 03/23/18 0509 03/29/18 0547  LIPASE 152* 39   No results for input(s): AMMONIA in the last 168 hours. Coagulation Profile: No results for input(s): INR, PROTIME in the last 168 hours. Cardiac Enzymes: No results for input(s): CKTOTAL, CKMB, CKMBINDEX, TROPONINI in the last 168 hours. BNP (last 3 results) No results for input(s): PROBNP in the last 8760 hours. HbA1C: No results for input(s): HGBA1C in the  last 72 hours. CBG: Recent Labs  Lab 03/28/18 1214 03/28/18 1720 03/28/18 2200 03/29/18 0829 03/29/18 1237  GLUCAP 165* 163* 172* 194* 173*   Lipid Profile: No results for input(s): CHOL, HDL, LDLCALC, TRIG, CHOLHDL, LDLDIRECT in the last 72 hours. Thyroid Function Tests: No results for input(s): TSH, T4TOTAL, FREET4, T3FREE, THYROIDAB in the last 72 hours. Anemia Panel: No results for input(s): VITAMINB12, FOLATE, FERRITIN, TIBC, IRON, RETICCTPCT in the last 72 hours. Urine analysis:    Component Value Date/Time   COLORURINE YELLOW (A) 03/20/2018 1715   APPEARANCEUR CLEAR (A) 03/20/2018 1715   LABSPEC >1.046 (H) 03/20/2018 1715   PHURINE 5.0 03/20/2018 1715   GLUCOSEU >=500 (A) 03/20/2018 1715   HGBUR MODERATE (A) 03/20/2018 1715   BILIRUBINUR NEGATIVE 03/20/2018 1715   KETONESUR 20 (A) 03/20/2018 1715   PROTEINUR 100 (A) 03/20/2018 1715   NITRITE NEGATIVE 03/20/2018 1715   LEUKOCYTESUR NEGATIVE 03/20/2018 1715   Sepsis Labs: @LABRCNTIP (procalcitonin:4,lacticidven:4)  ) Recent Results (from the past 240 hour(s))  MRSA PCR Screening     Status: None   Collection Time: 03/20/18 11:00 PM  Result Value Ref Range Status   MRSA by PCR NEGATIVE NEGATIVE Final    Comment:        The GeneXpert MRSA Assay (FDA approved for NASAL specimens only), is one component of a comprehensive MRSA colonization surveillance program. It is not intended to diagnose MRSA infection nor to guide or monitor treatment for MRSA infections. Performed at [facility], [address] [place], [place] [address]   Culture, respiratory (non-expectorated)     Status: None   Collection Time: 03/23/18  4:43 AM  Result Value Ref Range Status   Specimen Description TRACHEAL ASPIRATE  Final   Special Requests NONE  Final   Gram Stain   Final    FEW WBC PRESENT, PREDOMINANTLY PMN RARE GRAM POSITIVE COCCI    Culture   Final    Consistent with normal respiratory flora. Performed at  [facility], [address], [place], [place] [address]    Report Status 03/25/2018 FINAL  Final  Culture, blood (routine x 2)     Status: None   Collection Time: 03/23/18  10:57 AM  Result Value Ref Range Status   Specimen Description BLOOD BLOOD LEFT HAND  Final   Special Requests AEROBIC BOTTLE ONLY Blood Culture adequate volume  Final   Culture   Final    NO GROWTH 5 DAYS Performed at [facility], [address] [place], [place] [address]    Report Status 03/28/2018 FINAL  Final  Culture, blood (routine x 2)     Status: None   Collection Time: 03/23/18 11:00 AM  Result Value Ref Range Status   Specimen Description BLOOD A-LINE  Final   Special Requests   Final    BOTTLES DRAWN AEROBIC AND ANAEROBIC Blood Culture adequate volume   Culture   Final    NO GROWTH 5 DAYS Performed at [facility], [address] [place], [place] [address]    Report Status 03/28/2018 FINAL  Final      Radiology Studies: No results found.   Scheduled Meds: . amLODipine  5 mg Oral Daily  . chlorhexidine  15 mL Mouth Rinse BID  . cloNIDine  0.1 mg Oral BID   Followed by  . [START ON 03/30/2018] cloNIDine  0.1 mg Oral Daily  . feeding supplement (ENSURE ENLIVE)  237 mL Oral BID BM  . folic acid  1 mg Per Tube Daily  . heparin injection (subcutaneous)  5,000 Units Subcutaneous Q8H  . insulin aspart  0-20 Units Subcutaneous TID AC & HS  . insulin aspart  2 Units Subcutaneous TID AC  . insulin glargine  15 Units Subcutaneous Daily  . mouth rinse  15 mL Mouth Rinse q12n4p  . nicotine  21 mg Transdermal Daily  . thiamine  100 mg Per Tube Daily   Continuous Infusions: . sodium chloride Stopped (03/26/18 1124)     LOS: 6 days   Time Spent in minutes   30 minutes  Chioma Mukherjee D.O. on 03/29/2018 at 2:27 PM  Between 7am to 7pm - Please see pager noted on [email or web address]  After 7pm go to [email or web address]  And look for the night coverage person covering for me after  hours  [facility] Office  [phone]

## 2018-03-30 LAB — CBC
HCT: 40.3 % (ref 39.0–52.0)
HEMOGLOBIN: 13.3 g/dL (ref 13.0–17.0)
MCH: 32.4 pg (ref 26.0–34.0)
MCHC: 33 g/dL (ref 30.0–36.0)
MCV: 98.3 fL (ref 80.0–100.0)
Platelets: 192 10*3/uL (ref 150–400)
RBC: 4.1 MIL/uL — ABNORMAL LOW (ref 4.22–5.81)
RDW: 12.9 % (ref 11.5–15.5)
WBC: 16.2 10*3/uL — ABNORMAL HIGH (ref 4.0–10.5)
nRBC: 0 % (ref 0.0–0.2)

## 2018-03-30 LAB — RENAL FUNCTION PANEL
ALBUMIN: 2.2 g/dL — AB (ref 3.5–5.0)
Anion gap: 13 (ref 5–15)
BUN: 14 mg/dL (ref 6–20)
CO2: 23 mmol/L (ref 22–32)
Calcium: 8.6 mg/dL — ABNORMAL LOW (ref 8.9–10.3)
Chloride: 104 mmol/L (ref 98–111)
Creatinine, Ser: 0.81 mg/dL (ref 0.61–1.24)
GFR calc Af Amer: >60 mL/min (ref >60)
GFR calc non Af Amer: >60 mL/min (ref >60)
Glucose, Bld: 158 mg/dL — ABNORMAL HIGH (ref 70–99)
Phosphorus: 3.8 mg/dL (ref 2.5–4.6)
Potassium: 3.4 mmol/L — ABNORMAL LOW (ref 3.5–5.1)
Sodium: 140 mmol/L (ref 135–145)

## 2018-03-30 LAB — GLUCOSE, CAPILLARY
Glucose-Capillary: 231 mg/dL — ABNORMAL HIGH (ref 70–99)
Glucose-Capillary: 234 mg/dL — ABNORMAL HIGH (ref 70–99)

## 2018-03-30 LAB — MAGNESIUM: Magnesium: 1.7 mg/dL (ref 1.7–2.4)

## 2018-03-30 MED ORDER — THIAMINE HCL 100 MG PO TABS: 100.0000 mg | ORAL_TABLET | Freq: Every day | ORAL | 0 refills | Status: DC

## 2018-03-30 MED ORDER — BLOOD GLUCOSE METER KIT: PACK | 0 refills | Status: DC

## 2018-03-30 MED ORDER — INSULIN PEN NEEDLE 31G X 5 MM MISC: 0 refills | Status: DC

## 2018-03-30 MED ORDER — AMLODIPINE BESYLATE 5 MG PO TABS: 5.0000 mg | ORAL_TABLET | Freq: Every day | ORAL | 0 refills | Status: DC

## 2018-03-30 MED ORDER — INSULIN STARTER KIT- PEN NEEDLES (ENGLISH)
1.0000 | Freq: Once | Status: DC
  Filled 2018-03-30: qty 1

## 2018-03-30 MED ORDER — INSULIN ASPART 100 UNIT/ML FLEXPEN: PEN_INJECTOR | SUBCUTANEOUS | 0 refills | Status: DC

## 2018-03-30 MED ORDER — POTASSIUM CHLORIDE CRYS ER 20 MEQ PO TBCR
40.0000 meq | EXTENDED_RELEASE_TABLET | Freq: Once | ORAL | Status: AC
  Administered 2018-03-30: 40 meq via ORAL
  Filled 2018-03-30: qty 2

## 2018-03-30 MED ORDER — INSULIN GLARGINE 100 UNIT/ML SOLOSTAR PEN: 15.0000 [IU] | PEN_INJECTOR | Freq: Every day | SUBCUTANEOUS | 0 refills | Status: DC

## 2018-03-30 MED ORDER — FOLIC ACID 1 MG PO TABS: 1.0000 mg | ORAL_TABLET | Freq: Every day | ORAL | 0 refills | Status: DC

## 2018-03-30 MED ORDER — ENSURE ENLIVE PO LIQD: 237.0000 mL | Freq: Two times a day (BID) | ORAL | 0 refills | Status: DC

## 2018-03-30 NOTE — Discharge Summary (Signed)
Physician Discharge Summary  Randy Deleon YQI:[id number] DOB: 1965-11-06 DOA: 03/23/2018  PCP: [name], MD  Admit date: 03/23/2018 Discharge date: 03/30/2018  Time spent: 45 minutes  Recommendations for Outpatient Follow-up:  Patient will be discharged to home.  Patient will need to follow up with primary care provider within one week of discharge, repeat BMP and discuss diabetes and blood pressure control.  Patient should continue medications as prescribed.  Patient should follow a heart healthy/carb modified diet.   Discharge Diagnoses:  Principal diagnosis: Acute hypoxic respiratory failure Acute alcoholic pancreatitis Acute metabolic encephalopathy Acute kidney injury Fluid and electrolyte imbalance Diabetes mellitus, type II Deconditioning Dysphasia Constipation Leukocytosis  Discharge Condition: Stable  Diet recommendation: heart healthy/carb modified  Filed Weights   03/28/18 1450 03/29/18 0442 03/30/18 0500  Weight: 106.6 kg 97.9 kg 106.1 kg    History of present illness:  on 03/23/2018 by Dr. Mali Kloefkorn (PCCM) 53 year old male history of diabetesmellitus, alcohol abuse, who presented to [facility] with complaints of nausea, vomiting and abdominal pain. There he was diagnosed with acute pancreatitis based on a CT of the abdomen. He was also treated for diabetic ketoacidosis based on elevated blood glucose and anion gap.He was initially admitted to the floor however he worsened requiring transfer to the ICU, intubation, 2 vasopressors and maximum ventilator support. He developed renal failure requiring initiation of CRRT for which general surgery was consulted for possible abdominal compartment syndrome. They evaluated him and noted that his abdominal pressure was 12 cmH2O and did not believe that he had abdominal compartment syndrome at that time however were concerned should he deteriorate that he should be at a higher level of  care for which she was transferred to the [facility] MICU.   Hospital Course:  Acute hypoxic respiratory failure -Secondary to ARDS from pancreatitis with superimposed volume overload -Patient was intubated, successfully extubated on 03/26/2018 -Currently on room air maintaining oxygen saturations in the 38V  Acute alcoholic pancreatitis -Lipase trending down, was 763 on admission- down to 39 -Currently tolerating a regular diet -Continue folic acid and thiamine -Discussed alcohol cessation  Acute metabolic encephalopathy -Patient required Precedex drip with clonidine taper.  Precedex was discontinued on 03/27/2018 -Placed on clonidine taper -Suspect secondary to the above -currently resolved, patient AAOx4  Acute kidney injury -Patient required CRRT, but has not had a session since 03/25/2018 -Acute kidney injury has resolved  Fluid and electrolyte imbalance -potassium replaced, repeat BMP in one week  Diabetes mellitus, type II -Continue Lantus, insulin sliding scale with CBG monitoring -Diabetes coordinator urged patient to discontinue his home medications as Kombiglyze can cause pancreatitis and Invokana can increase risk of DKA -Hemoglobin A1c of 12.3 on 11/20/2017 -Need to follow-up closely with PCP -will discharge patient with lantus, ISS, blood glucose meter/kit  Deconditioning -Initially seen by physical therapy, and CIR was recommended. -Today however CIR did evaluate patient and feels that he could be discharged home with home health. -Physical therapy now agrees -Case management consulted  Dysphagia -Improving, patient being followed by speech therapy who recommended a regular diet on 03/28/2018  Constipation -Continue bowel regimen  Leukocytosis -Suspect secondary to pancreatitis and the above processes -Trending downward -Blood cultures show no growth to date -Respiratory culture shows normal respiratory  flora  Consultants PCCM Nephrology Inpatient rehab  Procedures / significant events 1/4: Transferred to [facility] , nephrology consulted, CRRT resumed. This had been initially started on 1/3 at [facility] 1/5: Vasopressors weaned off. Tolerating fluid  removal. Antibiotics stopped. Insulin drip discontinued. Sedation regimen changed to Precedex with decreasing RASS goal 1/6: Initiating weaning attempts, CRRT discontinued when filter clotted 1/7: Extubated successfully  Discharge Exam: Vitals:   03/29/18 2225 03/30/18 0554  BP: (!) 164/83 (!) 169/84  Pulse: 97 88  Resp: 17 18  Temp: 98.4 F (36.9 C) 98.7 F (37.1 C)  SpO2: 96% 94%   Patient feeling better and ready to go home. Has no complaints. Denies chest pain, shortness of breath, abdominal pain, N/V/D/C.    General: Well developed, well nourished, NAD, appears stated age  78: NCAT, mucous membranes moist.  Neck: Supple  Cardiovascular: S1 S2 auscultated, no murmur, RRR  Respiratory: Clear to auscultation bilaterally with equal chest rise  Abdomen: Soft, nontender, nondistended, + bowel sounds  Extremities: warm dry without cyanosis clubbing or edema  Neuro: AAOx3, nonfocal  Psych: Normal affect and demeanor  Discharge Instructions Discharge Instructions    Discharge instructions   Complete by:  As directed    Patient will be discharged to home.  Patient will need to follow up with primary care provider within one week of discharge, repeat BMP and discuss diabetes and blood pressure control.  Patient should continue medications as prescribed.  Patient should follow a heart healthy/carb modified diet.     Allergies as of 03/30/2018      Reactions   Contrast Media [iodinated Diagnostic Agents]    Renal failure   Other Rash, Other (See Comments)   Tested allergic to LIMA BEANS   Shellfish Allergy Rash, Other (See Comments)   TESTED ALLERGIC TO "SHELLFISH"      Medication List    STOP  taking these medications   INVOKANA 300 MG Tabs tablet Generic drug:  canagliflozin   KOMBIGLYZE XR 07-998 MG Tb24 Generic drug:  Saxagliptin-Metformin     TAKE these medications   amLODipine 5 MG tablet Commonly known as:  NORVASC Take 1 tablet (5 mg total) by mouth daily. Start taking on:  March 31, 2018   blood glucose meter kit and supplies Dispense based on patient and insurance preference. Use up to four times daily as directed. (FOR ICD-10 E10.9, E11.9).   feeding supplement (ENSURE ENLIVE) Liqd Take 237 mLs by mouth 2 (two) times daily between meals.   folic acid 1 MG tablet Commonly known as:  FOLVITE Place 1 tablet (1 mg total) into feeding tube daily. Start taking on:  March 31, 2018   insulin aspart 100 UNIT/ML FlexPen Commonly known as:  NOVOLOG Use TID with meals and bedtime: CBG 121-150: 3u; CBG 151-200: 4u; CBG 201-250: 7u; CBG 251-300: 11u; CBG 301- 350: 15u; CBG 351-400: 20u   Insulin Glargine 100 UNIT/ML Solostar Pen Commonly known as:  LANTUS Inject 15 Units into the skin daily.   Insulin Pen Needle 31G X 5 MM Misc Use with insulin pens.   nicotine 21 mg/24hr patch Commonly known as:  NICODERM CQ - dosed in mg/24 hours Place 1 patch (21 mg total) onto the skin daily.   ondansetron 4 MG tablet Commonly known as:  ZOFRAN Take 1 tablet (4 mg total) by mouth every 6 (six) hours as needed for nausea.   thiamine 100 MG tablet Place 1 tablet (100 mg total) into feeding tube daily. Start taking on:  March 31, 2018   VASCEPA 1 g Caps Generic drug:  Icosapent Ethyl Take 2 g by mouth 2 (two) times daily.   Vitamin D3 125 MCG (5000 UT) Caps Take 5,000 Units by  mouth daily.      Allergies  Allergen Reactions  . Contrast Media [Iodinated Diagnostic Agents]     Renal failure  . Other Rash and Other (See Comments)    Tested allergic to LIMA BEANS  . Shellfish Allergy Rash and Other (See Comments)    TESTED ALLERGIC TO "SHELLFISH"   Follow-up  Information    [name], MD. Schedule an appointment as soon as possible for a visit in 1 week(s).   Specialty:  Internal Medicine Why:  Hospital follow up Contact information: [place] [address] [phone]            The results of significant diagnostics from this hospitalization (including imaging, microbiology, ancillary and laboratory) are listed below for reference.    Significant Diagnostic Studies: Ct Abdomen Pelvis Wo Contrast  Result Date: 03/22/2018 CLINICAL DATA:  Acute onset of increased work of breathing. Severe generalized abdominal pain and distention. Recently diagnosed with acute pancreatitis. EXAM: CT ABDOMEN AND PELVIS WITHOUT CONTRAST TECHNIQUE: Multidetector CT imaging of the abdomen and pelvis was performed following the standard protocol without IV contrast. COMPARISON:  CT of the abdomen and pelvis performed 03/20/2018 FINDINGS: Lower chest: Small bilateral pleural effusions are noted. Partial consolidation of both lower lobes likely reflects atelectasis. Diffuse coronary artery calcifications are seen. Hepatobiliary: The liver is unremarkable in appearance. Residual contrast is noted within the gallbladder. The common bile duct is normal in caliber. The common bile duct remains normal in caliber. Pancreas: Diffuse soft tissue inflammation is again noted about the pancreas, reflecting known pancreatitis. There is increased fluid tracking about the greater curvature of the stomach, raising concern for evolving pseudocyst. Diffuse mesenteric edema is seen tracking inferiorly, with fluid tracking along the paracolic gutters bilaterally into the pelvis. Evaluation for devascularization is markedly limited without contrast. Spleen: The spleen is unremarkable in appearance. Adrenals/Urinary Tract: The adrenal glands are unremarkable in appearance. The kidneys are within normal limits. There is no evidence of hydronephrosis. No renal or ureteral  stones are identified; increased attenuation within the renal calyces reflects minimal residual contrast. Mild bilateral perinephric stranding is noted. Stomach/Bowel: The stomach is unremarkable in appearance. The small bowel is within normal limits. The appendix is not visualized; there is no evidence for appendicitis. The colon is unremarkable in appearance. [name] enteric tube is noted ending at the first segment of the duodenum. Vascular/Lymphatic: Scattered calcification is seen along the abdominal aorta and its branches. The abdominal aorta is otherwise grossly unremarkable. The inferior vena cava is grossly unremarkable. No retroperitoneal lymphadenopathy is seen. No pelvic sidewall lymphadenopathy is identified. Reproductive: The bladder is decompressed, with a Foley catheter in place. The prostate is borderline normal in size. Other: No additional soft tissue abnormalities are seen. A left femoral line is noted. Musculoskeletal: No acute osseous abnormalities are identified. The visualized musculature is unremarkable in appearance. IMPRESSION: 1. Pancreatitis again noted. Increased fluid tracking about the greater curvature of the stomach, raising concern for evolving pseudocyst. Diffuse mesenteric edema tracks inferiorly, with fluid tracking along the paracolic gutters bilaterally into the pelvis. The amount of fluid has increased since the prior study, concerning for worsening pancreatitis. Evaluation for devascularization is markedly limited without contrast. 2. Small bilateral pleural effusions. Partial consolidation of both lower lung lobes likely reflects atelectasis. 3. Diffuse coronary artery calcifications seen. Aortic Atherosclerosis (ICD10-I70.0). Electronically Signed   By: Garald Balding M.D.   On: 03/22/2018 03:12   Ct Abdomen Pelvis W Contrast  Result Date: 03/20/2018  CLINICAL DATA:  Hyperglycemia, nausea vomiting EXAM: CT ABDOMEN AND PELVIS WITH CONTRAST TECHNIQUE: Multidetector  CT imaging of the abdomen and pelvis was performed using the standard protocol following bolus administration of intravenous contrast. CONTRAST:  147m ISOVUE-300 IOPAMIDOL (ISOVUE-300) INJECTION 61% COMPARISON:  08/15/2006 UGI FINDINGS: Lower chest: Lung bases demonstrate no consolidation or pleural effusion. Borderline cardiomegaly. Coronary calcifications. Small hiatal hernia Hepatobiliary: Steatosis. No calcified gallstone or biliary dilatation Pancreas: Severe peripancreatic inflammatory changes consistent with pancreatitis. Moderate fluid within the mesentery and pararenal spaces. No ductal dilatation. Spleen: Normal in size without focal abnormality. Adrenals/Urinary Tract: Adrenal glands are normal. No hydronephrosis. Subcentimeter hypodensity mid left kidney too small to further characterize. Bladder normal Stomach/Bowel: Stomach is within normal limits. Appendix appears normal. Wall thickening of the anterior wall of the second and third portions of the duodenum. Vascular/Lymphatic: Mild aortic atherosclerosis without aneurysm. Normal portal vein, mesenteric vein, and splenic vein enhancement. Mildly prominent peripancreatic lymph nodes best seen on coronal views. Reproductive: Prostate is unremarkable. Other: Negative for free air. Small free fluid within the upper abdomen. Oval calcification within the central mesentery, possible calcified node. Musculoskeletal: No acute or suspicious abnormality IMPRESSION: 1. Marked edema and fluid surrounding the pancreas, consistent with acute pancreatitis. No organized fluid collections or evidence for necrosis at this time. 2. Anterior wall thickening of the second and third portion of duodenum, likely reactive/due to inflammatory changes in the pancreas. 3. Hepatic steatosis Electronically Signed   By: KDonavan FoilM.D.   On: 03/20/2018 19:18   Dg Chest Port 1 View  Result Date: 03/25/2018 CLINICAL DATA:  Acute respiratory failure with hypoxia. EXAM: PORTABLE  CHEST 1 VIEW COMPARISON:  One-view chest x-ray 03/23/2018. FINDINGS: The patient remains intubated. Endotracheal tube terminates 5 cm above the carina. A temperature probe is in place. NG tube courses off the inferior border of the film. Mild cardiac enlargement is stable. Bilateral pleural effusions and interstitial edema have improved. Mild bibasilar airspace disease remains. IMPRESSION: 1. Improving aeration with decreasing edema and bilateral effusions. 2. Support apparatus is stable. 3. Small bilateral pleural effusions remain. 4. Bibasilar airspace disease remains, indicative of atelectasis. Electronically Signed   By: CSan MorelleM.D.   On: 03/25/2018 07:42   Dg Chest Port 1 View  Result Date: 03/23/2018 CLINICAL DATA:  53year old male with respiratory failure requiring intubation. EXAM: PORTABLE CHEST 1 VIEW COMPARISON:  Prior chest x-ray 03/22/2018 FINDINGS: The tip of the endotracheal tube is 2.8 cm above the carina. A gastric tube is present. The tip lies inferior to the diaphragm and below the field of view, presumably within the stomach. Cardiac and mediastinal contours are within normal limits. Persistent bilateral layering pleural effusions with associated bibasilar airspace opacities. Similar degree of mild pulmonary edema. No acute osseous abnormality. No pneumothorax. IMPRESSION: Essentially unchanged appearance of the chest with persistent pulmonary edema, bilateral layering pleural effusions and associated bibasilar atelectasis. Stable and satisfactory support apparatus. Electronically Signed   By: HJacqulynn CadetM.D.   On: 03/23/2018 08:13   Dg Chest Port 1 View  Result Date: 03/22/2018 CLINICAL DATA:  Respiratory failure. EXAM: PORTABLE CHEST 1 VIEW COMPARISON:  03/21/2018. FINDINGS: Endotracheal tube in stable position. NG tube below left hemidiaphragm. Cardiomegaly. Bilateral pulmonary infiltrates and bilateral pleural effusions progressed from prior exam. CHF and bibasilar  pneumonia could present this fashion. No pneumothorax. IMPRESSION: 1. Endotracheal tube stable position. NG tube below left hemidiaphragm. 2. Cardiomegaly with bibasilar infiltrates and bilateral pleural effusions, progressed from prior exam. CHF could  present this fashion. Bibasilar pneumonia can not be excluded. Electronically Signed   By: Marcello Moores  Register   On: 03/22/2018 06:27   Dg Chest Port 1 View  Result Date: 03/22/2018 CLINICAL DATA:  Central line placement EXAM: PORTABLE CHEST 1 VIEW COMPARISON:  03/22/2018 at 0324 hours FINDINGS: Endotracheal tube terminates 2.8 cm above the carina. Enteric tube courses into the stomach. Moderate right and small left pleural effusions. Associated left lower lobe opacity, likely atelectasis. Mild frank interstitial edema. No pneumothorax. Cardiomegaly. IMPRESSION: Endotracheal tube terminates 2.8 cm above the carina. Enteric tube courses into the stomach. Cardiomegaly with mild interstitial edema. Moderate right and small left pleural effusions. Electronically Signed   By: Julian Hy M.D.   On: 03/22/2018 07:10   Dg Chest Port 1 View  Result Date: 03/21/2018 CLINICAL DATA:  Status post intubation EXAM: PORTABLE CHEST 1 VIEW COMPARISON:  None. FINDINGS: Endotracheal tube tip is about 3.3 cm superior to carina. Esophageal tube tip is below diaphragm but non included in the field of view. Small bilateral pleural effusions and bibasilar dense airspace disease. Mild cardiomegaly. No pneumothorax. IMPRESSION: 1. Endotracheal tube tip about 3.3 cm superior to carina 2. Interval development of small bilateral pleural effusions and dense bibasilar atelectasis or pneumonia Electronically Signed   By: Donavan Foil M.D.   On: 03/21/2018 17:44   Dg Abd Portable 1v  Result Date: 03/21/2018 CLINICAL DATA:  NG tube EXAM: PORTABLE ABDOMEN - 1 VIEW COMPARISON:  CT 03/20/2018 FINDINGS: Limited by habitus. Esophageal tube tip overlies the distal stomach. Consolidation at both  bases. Nonobstructed gas pattern. IMPRESSION: Esophageal tube tip overlies the distal stomach Electronically Signed   By: Donavan Foil M.D.   On: 03/21/2018 17:45   [place] Ekg Site Rite  Result Date: 03/21/2018 If Site Rite image not attached, placement could not be confirmed due to current cardiac rhythm.   Microbiology: Recent Results (from the past 240 hour(s))  MRSA PCR Screening     Status: None   Collection Time: 03/20/18 11:00 PM  Result Value Ref Range Status   MRSA by PCR NEGATIVE NEGATIVE Final    Comment:        The GeneXpert MRSA Assay (FDA approved for NASAL specimens only), is one component of a comprehensive MRSA colonization surveillance program. It is not intended to diagnose MRSA infection nor to guide or monitor treatment for MRSA infections. Performed at [facility], [address] [place], [place] [address]   Culture, respiratory (non-expectorated)     Status: None   Collection Time: 03/23/18  4:43 AM  Result Value Ref Range Status   Specimen Description TRACHEAL ASPIRATE  Final   Special Requests NONE  Final   Gram Stain   Final    FEW WBC PRESENT, PREDOMINANTLY PMN RARE GRAM POSITIVE COCCI    Culture   Final    Consistent with normal respiratory flora. Performed at [facility], [place] [address] [place], [place] [address]    Report Status 03/25/2018 FINAL  Final  Culture, blood (routine x 2)     Status: None   Collection Time: 03/23/18 10:57 AM  Result Value Ref Range Status   Specimen Description BLOOD BLOOD LEFT HAND  Final   Special Requests AEROBIC BOTTLE ONLY Blood Culture adequate volume  Final   Culture   Final    NO GROWTH 5 DAYS Performed at [facility], [place] [address], [place], [place] [address]    Report Status 03/28/2018 FINAL  Final  Culture, blood (routine x 2)     Status: None   Collection Time: 03/23/18 11:00 AM  Result Value Ref Range Status   Specimen Description BLOOD A-LINE  Final   Special Requests    Final    BOTTLES DRAWN AEROBIC AND ANAEROBIC Blood Culture adequate volume   Culture   Final    NO GROWTH 5 DAYS Performed at [facility], [address] [place], [place] [address]    Report Status 03/28/2018 FINAL  Final     Labs: Basic Metabolic Panel: Recent Labs  Lab 03/26/18 0500 03/26/18 0551 03/27/18 0430 03/28/18 0453 03/29/18 0547 03/30/18 0517  NA 138 138 145 143 140 140  K 4.1 4.1 4.1 3.7 4.1 3.4*  CL 104 104 113* 109 105 104  CO2 _0 21* 23  GLUCOSE 279* 279* 179* 208* 172* 158*  BUN 56* 56* 44* 32* 20 14  CREATININE 1.68* 1.70* 1.24 1.09 0.86 0.81  CALCIUM 8.5* 8.5* 8.6* 8.6* 8.6* 8.6*  MG  --  2.7* 2.5* 2.0 1.8 1.7  PHOS 3.2  --  2.3* 3.0 3.6 3.8   Liver Function Tests: Recent Labs  Lab 03/26/18 0500 03/27/18 0430 03/28/18 0453 03/29/18 0547 03/30/18 0517  ALBUMIN 1.9* 1.8* 1.9* 2.2* 2.2*   Recent Labs  Lab 03/29/18 0547  LIPASE 39   No results for input(s): AMMONIA in the last 168 hours. CBC: Recent Labs  Lab 03/25/18 0504 03/26/18 0551 03/27/18 0430 03/29/18 0547 03/30/18 0517  WBC 8.2 14.0* 19.9* 17.2* 16.2*  HGB 13.7 13.4 13.3 13.1 13.3  HCT 40.5 40.5 40.2 40.6 40.3  MCV 100.2* 101.8* 100.0 100.0 98.3  PLT 67* 106* 157 194 192   Cardiac Enzymes: No results for input(s): CKTOTAL, CKMB, CKMBINDEX, TROPONINI in the last 168 hours. BNP: BNP (last 3 results) No results for input(s): BNP in the last 8760 hours.  ProBNP (last 3 results) No results for input(s): PROBNP in the last 8760 hours.  CBG: Recent Labs  Lab 03/29/18 0829 03/29/18 1237 03/29/18 1734 03/29/18 2202 03/30/18 0821  GLUCAP 194* 173* 170* 115* 231*       Signed:  Taisei Bonnette  [facility] 03/30/2018, 9:53 AM

## 2018-03-30 NOTE — Progress Notes (Signed)
Randy Deleon to be D/C'd  per MD order. Discussed with the patient and all questions fully answered.  VSS, Skin clean, dry and intact without evidence of skin break down, no evidence of skin tears noted.  IV catheter discontinued intact. Site without signs and symptoms of complications. Dressing and pressure applied.  An After Visit Summary was printed and given to the patient. Patient received prescription.  D/c education completed with patient/family including follow up instructions, medication list, d/c activities limitations if indicated, with other d/c instructions as indicated by MD - patient able to verbalize understanding, all questions fully answered.   Patient instructed to return to ED, call 911, or call MD for any changes in condition.   Patient to be escorted via WC, and D/C home via private auto.

## 2018-03-30 NOTE — Progress Notes (Signed)
Nutrition Consult Diet Education  RD contacted by RN for nutrition education regarding diabetes and heart healthy diet. Patient's mom at bedside, directed diet education to her per patient request.   Lab Results  Component Value Date   HGBA1C 12.3 (H) 11/20/2017    RD provided "Heart Healthy Consistent Carbohydrate" handout from the Academy of [employer] and Dietetics. Discussed different food groups and their effects on blood sugar, emphasizing carbohydrate-containing foods. Provided list of carbohydrates and recommended serving sizes of common foods.  Discussed importance of controlled and consistent carbohydrate intake throughout the day. Provided examples of ways to balance meals/snacks and encouraged intake of high-fiber, whole grain complex carbohydrates. Teach back method used.  Expect good compliance.  Questions answered and RD contact information provided for any future questions.   Body mass index is 34.53 kg/m. Pt meets criteria for obesity, class 1 based on current BMI.  Patient being discharged home today, no further nutrition needs at this time.   Joaquin Courts, RD, LDN, CNSC Pager [phone] After Hours Pager [phone]

## 2018-12-25 ENCOUNTER — Other Ambulatory Visit: Payer: Self-pay

## 2018-12-25 ENCOUNTER — Ambulatory Visit: Payer: BC Managed Care – PPO

## 2018-12-25 DIAGNOSIS — Z23 Encounter for immunization: Secondary | ICD-10-CM

## 2019-06-28 ENCOUNTER — Emergency Department: Payer: Self-pay

## 2019-06-28 ENCOUNTER — Encounter: Payer: Self-pay | Admitting: *Deleted

## 2019-06-28 ENCOUNTER — Other Ambulatory Visit: Payer: Self-pay

## 2019-06-28 ENCOUNTER — Inpatient Hospital Stay
Admission: EM | Admit: 2019-06-28 | Discharge: 2019-06-29 | DRG: 637 | Disposition: A | Discharge status: Home / Self Care | Payer: Self-pay | Attending: Internal Medicine | Admitting: Internal Medicine

## 2019-06-28 DIAGNOSIS — Z833 Family history of diabetes mellitus: Secondary | ICD-10-CM

## 2019-06-28 DIAGNOSIS — K859 Acute pancreatitis without necrosis or infection, unspecified: Secondary | ICD-10-CM | POA: Diagnosis present

## 2019-06-28 DIAGNOSIS — Z20822 Contact with and (suspected) exposure to covid-19: Secondary | ICD-10-CM | POA: Diagnosis present

## 2019-06-28 DIAGNOSIS — Z794 Long term (current) use of insulin: Secondary | ICD-10-CM

## 2019-06-28 DIAGNOSIS — R112 Nausea with vomiting, unspecified: Secondary | ICD-10-CM

## 2019-06-28 DIAGNOSIS — D6959 Other secondary thrombocytopenia: Secondary | ICD-10-CM | POA: Diagnosis present

## 2019-06-28 DIAGNOSIS — D696 Thrombocytopenia, unspecified: Secondary | ICD-10-CM

## 2019-06-28 DIAGNOSIS — Z8249 Family history of ischemic heart disease and other diseases of the circulatory system: Secondary | ICD-10-CM

## 2019-06-28 DIAGNOSIS — Z91013 Allergy to seafood: Secondary | ICD-10-CM

## 2019-06-28 DIAGNOSIS — F17210 Nicotine dependence, cigarettes, uncomplicated: Secondary | ICD-10-CM | POA: Diagnosis present

## 2019-06-28 DIAGNOSIS — Z79899 Other long term (current) drug therapy: Secondary | ICD-10-CM

## 2019-06-28 DIAGNOSIS — F1010 Alcohol abuse, uncomplicated: Secondary | ICD-10-CM | POA: Diagnosis present

## 2019-06-28 DIAGNOSIS — I10 Essential (primary) hypertension: Secondary | ICD-10-CM | POA: Diagnosis present

## 2019-06-28 DIAGNOSIS — E1010 Type 1 diabetes mellitus with ketoacidosis without coma: Principal | ICD-10-CM | POA: Diagnosis present

## 2019-06-28 DIAGNOSIS — K852 Alcohol induced acute pancreatitis without necrosis or infection: Secondary | ICD-10-CM

## 2019-06-28 DIAGNOSIS — E1110 Type 2 diabetes mellitus with ketoacidosis without coma: Secondary | ICD-10-CM

## 2019-06-28 LAB — BLOOD GAS, VENOUS
Acid-base deficit: 17 mmol/L — ABNORMAL HIGH (ref 0.0–2.0)
Bicarbonate: 9.5 mmol/L — ABNORMAL LOW (ref 20.0–28.0)
O2 Saturation: 91.7 %
Patient temperature: 37
pCO2, Ven: 25 mmHg — ABNORMAL LOW (ref 44.0–60.0)
pH, Ven: 7.19 — CL (ref 7.250–7.430)
pO2, Ven: 78 mmHg — ABNORMAL HIGH (ref 32.0–45.0)

## 2019-06-28 LAB — URINALYSIS, COMPLETE (UACMP) WITH MICROSCOPIC
Bacteria, UA: NONE SEEN
Bilirubin Urine: NEGATIVE
Glucose, UA: >=500 mg/dL — AB
Ketones, ur: 80 mg/dL — AB
Leukocytes,Ua: NEGATIVE
Nitrite: NEGATIVE
Protein, ur: 100 mg/dL — AB
Specific Gravity, Urine: 1.022 (ref 1.005–1.030)
Squamous Epithelial / HPF: NONE SEEN (ref 0–5)
pH: 5 (ref 5.0–8.0)

## 2019-06-28 LAB — BASIC METABOLIC PANEL
Anion gap: 20 — ABNORMAL HIGH (ref 5–15)
Anion gap: 12 (ref 5–15)
Anion gap: 15 (ref 5–15)
BUN: 10 mg/dL (ref 6–20)
BUN: 9 mg/dL (ref 6–20)
BUN: 8 mg/dL (ref 6–20)
CO2: 10 mmol/L — ABNORMAL LOW (ref 22–32)
CO2: 19 mmol/L — ABNORMAL LOW (ref 22–32)
CO2: 17 mmol/L — ABNORMAL LOW (ref 22–32)
Calcium: 9 mg/dL (ref 8.9–10.3)
Calcium: 9.3 mg/dL (ref 8.9–10.3)
Calcium: 9.4 mg/dL (ref 8.9–10.3)
Chloride: 103 mmol/L (ref 98–111)
Chloride: 103 mmol/L (ref 98–111)
Chloride: 102 mmol/L (ref 98–111)
Creatinine, Ser: 1.04 mg/dL (ref 0.61–1.24)
Creatinine, Ser: 0.95 mg/dL (ref 0.61–1.24)
Creatinine, Ser: 0.86 mg/dL (ref 0.61–1.24)
GFR calc Af Amer: >60 mL/min (ref >60)
GFR calc Af Amer: >60 mL/min (ref >60)
GFR calc Af Amer: >60 mL/min (ref >60)
GFR calc non Af Amer: >60 mL/min (ref >60)
GFR calc non Af Amer: >60 mL/min (ref >60)
GFR calc non Af Amer: >60 mL/min (ref >60)
Glucose, Bld: 213 mg/dL — ABNORMAL HIGH (ref 70–99)
Glucose, Bld: 191 mg/dL — ABNORMAL HIGH (ref 70–99)
Glucose, Bld: 234 mg/dL — ABNORMAL HIGH (ref 70–99)
Potassium: 4.3 mmol/L (ref 3.5–5.1)
Potassium: 4.1 mmol/L (ref 3.5–5.1)
Potassium: 4 mmol/L (ref 3.5–5.1)
Sodium: 133 mmol/L — ABNORMAL LOW (ref 135–145)
Sodium: 134 mmol/L — ABNORMAL LOW (ref 135–145)
Sodium: 134 mmol/L — ABNORMAL LOW (ref 135–145)

## 2019-06-28 LAB — COMPREHENSIVE METABOLIC PANEL
ALT: 28 U/L (ref 0–44)
AST: 24 U/L (ref 15–41)
Albumin: 4.8 g/dL (ref 3.5–5.0)
Alkaline Phosphatase: 66 U/L (ref 38–126)
Anion gap: 25 — ABNORMAL HIGH (ref 5–15)
BUN: 11 mg/dL (ref 6–20)
CO2: 9 mmol/L — ABNORMAL LOW (ref 22–32)
Calcium: 9.6 mg/dL (ref 8.9–10.3)
Chloride: 100 mmol/L (ref 98–111)
Creatinine, Ser: 1.07 mg/dL (ref 0.61–1.24)
GFR calc Af Amer: >60 mL/min (ref >60)
GFR calc non Af Amer: >60 mL/min (ref >60)
Glucose, Bld: 261 mg/dL — ABNORMAL HIGH (ref 70–99)
Potassium: 4.6 mmol/L (ref 3.5–5.1)
Sodium: 134 mmol/L — ABNORMAL LOW (ref 135–145)
Total Bilirubin: 2.9 mg/dL — ABNORMAL HIGH (ref 0.3–1.2)
Total Protein: 8.5 g/dL — ABNORMAL HIGH (ref 6.5–8.1)

## 2019-06-28 LAB — GLUCOSE, CAPILLARY
Glucose-Capillary: 255 mg/dL — ABNORMAL HIGH (ref 70–99)
Glucose-Capillary: 278 mg/dL — ABNORMAL HIGH (ref 70–99)
Glucose-Capillary: 234 mg/dL — ABNORMAL HIGH (ref 70–99)
Glucose-Capillary: 192 mg/dL — ABNORMAL HIGH (ref 70–99)
Glucose-Capillary: 178 mg/dL — ABNORMAL HIGH (ref 70–99)
Glucose-Capillary: 195 mg/dL — ABNORMAL HIGH (ref 70–99)
Glucose-Capillary: 167 mg/dL — ABNORMAL HIGH (ref 70–99)
Glucose-Capillary: 177 mg/dL — ABNORMAL HIGH (ref 70–99)
Glucose-Capillary: 178 mg/dL — ABNORMAL HIGH (ref 70–99)
Glucose-Capillary: 180 mg/dL — ABNORMAL HIGH (ref 70–99)
Glucose-Capillary: 170 mg/dL — ABNORMAL HIGH (ref 70–99)
Glucose-Capillary: 192 mg/dL — ABNORMAL HIGH (ref 70–99)
Glucose-Capillary: 219 mg/dL — ABNORMAL HIGH (ref 70–99)
Glucose-Capillary: 241 mg/dL — ABNORMAL HIGH (ref 70–99)
Glucose-Capillary: 214 mg/dL — ABNORMAL HIGH (ref 70–99)

## 2019-06-28 LAB — CBC
HCT: 51.9 % (ref 39.0–52.0)
Hemoglobin: 17.5 g/dL — ABNORMAL HIGH (ref 13.0–17.0)
MCH: 33.8 pg (ref 26.0–34.0)
MCHC: 33.7 g/dL (ref 30.0–36.0)
MCV: 100.2 fL — ABNORMAL HIGH (ref 80.0–100.0)
Platelets: 124 10*3/uL — ABNORMAL LOW (ref 150–400)
RBC: 5.18 MIL/uL (ref 4.22–5.81)
RDW: 11.9 % (ref 11.5–15.5)
WBC: 10.8 10*3/uL — ABNORMAL HIGH (ref 4.0–10.5)
nRBC: 0 % (ref 0.0–0.2)

## 2019-06-28 LAB — BETA-HYDROXYBUTYRIC ACID
Beta-Hydroxybutyric Acid: >8 mmol/L — ABNORMAL HIGH (ref 0.05–0.27)
Beta-Hydroxybutyric Acid: >8 mmol/L — ABNORMAL HIGH (ref 0.05–0.27)
Beta-Hydroxybutyric Acid: 3.82 mmol/L — ABNORMAL HIGH (ref 0.05–0.27)
Beta-Hydroxybutyric Acid: 5.27 mmol/L — ABNORMAL HIGH (ref 0.05–0.27)

## 2019-06-28 LAB — LIPASE, BLOOD: Lipase: 351 U/L — ABNORMAL HIGH (ref 11–51)

## 2019-06-28 LAB — HIV ANTIBODY (ROUTINE TESTING W REFLEX): HIV Screen 4th Generation wRfx: NONREACTIVE

## 2019-06-28 LAB — RESPIRATORY PANEL BY RT PCR (FLU A&B, COVID)
Influenza A by PCR: NEGATIVE
Influenza B by PCR: NEGATIVE
SARS Coronavirus 2 by RT PCR: NEGATIVE

## 2019-06-28 LAB — ETHANOL: Alcohol, Ethyl (B): <10 mg/dL (ref <10)

## 2019-06-28 MED ORDER — ONDANSETRON HCL 4 MG/2ML IJ SOLN
4.0000 mg | Freq: Once | INTRAMUSCULAR | Status: AC
  Administered 2019-06-28: 4 mg via INTRAVENOUS
  Filled 2019-06-28: qty 2

## 2019-06-28 MED ORDER — LABETALOL HCL 5 MG/ML IV SOLN
20.0000 mg | INTRAVENOUS | Status: DC | PRN
  Administered 2019-06-28: 07:00:00 20 mg via INTRAVENOUS
  Filled 2019-06-28: qty 4

## 2019-06-28 MED ORDER — DEXTROSE IN LACTATED RINGERS 5 % IV SOLN: INTRAVENOUS | Status: DC

## 2019-06-28 MED ORDER — TRAZODONE HCL 50 MG PO TABS
25.0000 mg | ORAL_TABLET | Freq: Every evening | ORAL | Status: DC | PRN
  Administered 2019-06-28: 25 mg via ORAL
  Filled 2019-06-28: qty 1

## 2019-06-28 MED ORDER — DEXTROSE-NACL 5-0.45 % IV SOLN: INTRAVENOUS | Status: DC

## 2019-06-28 MED ORDER — ENOXAPARIN SODIUM 40 MG/0.4ML ~~LOC~~ SOLN
40.0000 mg | SUBCUTANEOUS | Status: DC
  Administered 2019-06-28 – 2019-06-29 (×2): 40 mg via SUBCUTANEOUS
  Filled 2019-06-28 (×2): qty 0.4

## 2019-06-28 MED ORDER — DEXTROSE 50 % IV SOLN: 0.0000 mL | INTRAVENOUS | Status: DC | PRN

## 2019-06-28 MED ORDER — INSULIN REGULAR(HUMAN) IN NACL 100-0.9 UT/100ML-% IV SOLN
INTRAVENOUS | Status: DC
  Administered 2019-06-28: 16 [IU]/h via INTRAVENOUS
  Filled 2019-06-28: qty 100

## 2019-06-28 MED ORDER — LACTATED RINGERS IV BOLUS
1000.0000 mL | Freq: Once | INTRAVENOUS | Status: AC
  Administered 2019-06-28: 04:00:00 1000 mL via INTRAVENOUS

## 2019-06-28 MED ORDER — LACTATED RINGERS IV BOLUS
1000.0000 mL | Freq: Once | INTRAVENOUS | Status: AC
  Administered 2019-06-28: 03:00:00 1000 mL via INTRAVENOUS

## 2019-06-28 MED ORDER — SCOPOLAMINE 1 MG/3DAYS TD PT72
1.0000 | MEDICATED_PATCH | TRANSDERMAL | Status: DC
  Administered 2019-06-28: 1.5 mg via TRANSDERMAL
  Filled 2019-06-28: qty 1

## 2019-06-28 MED ORDER — INSULIN REGULAR(HUMAN) IN NACL 100-0.9 UT/100ML-% IV SOLN
INTRAVENOUS | Status: DC
  Administered 2019-06-28: 10 [IU]/h via INTRAVENOUS

## 2019-06-28 MED ORDER — ONDANSETRON HCL 4 MG/2ML IJ SOLN
4.0000 mg | Freq: Once | INTRAMUSCULAR | Status: AC
  Administered 2019-06-28: 09:00:00 4 mg via INTRAVENOUS
  Filled 2019-06-28: qty 2

## 2019-06-28 MED ORDER — ACETAMINOPHEN 325 MG PO TABS: 650.0000 mg | ORAL_TABLET | ORAL | Status: DC | PRN

## 2019-06-28 MED ORDER — SODIUM CHLORIDE 0.9 % IV SOLN: INTRAVENOUS | Status: DC

## 2019-06-28 MED ORDER — ONDANSETRON HCL 4 MG/2ML IJ SOLN: 4.0000 mg | INTRAMUSCULAR | Status: DC | PRN

## 2019-06-28 MED ORDER — ALUM & MAG HYDROXIDE-SIMETH 200-200-20 MG/5ML PO SUSP: 30.0000 mL | ORAL | Status: DC | PRN

## 2019-06-28 MED ORDER — POTASSIUM CHLORIDE 10 MEQ/100ML IV SOLN: 10.0000 meq | INTRAVENOUS | Status: DC

## 2019-06-28 MED ORDER — LABETALOL HCL 5 MG/ML IV SOLN
20.0000 mg | INTRAVENOUS | Status: DC | PRN
  Administered 2019-06-28 – 2019-06-29 (×2): 20 mg via INTRAVENOUS
  Filled 2019-06-28 (×2): qty 4

## 2019-06-28 MED ORDER — OMEGA-3-ACID ETHYL ESTERS 1 G PO CAPS
2.0000 g | ORAL_CAPSULE | Freq: Two times a day (BID) | ORAL | Status: DC
  Administered 2019-06-28 – 2019-06-29 (×3): 2 g via ORAL
  Filled 2019-06-28 (×4): qty 2

## 2019-06-28 MED ORDER — AMLODIPINE BESYLATE 5 MG PO TABS
5.0000 mg | ORAL_TABLET | Freq: Every day | ORAL | Status: DC
  Administered 2019-06-28 – 2019-06-29 (×2): 5 mg via ORAL
  Filled 2019-06-28 (×2): qty 1

## 2019-06-28 MED ORDER — INSULIN ASPART 100 UNIT/ML ~~LOC~~ SOLN
0.0000 [IU] | Freq: Three times a day (TID) | SUBCUTANEOUS | Status: DC
  Administered 2019-06-28: 2 [IU] via SUBCUTANEOUS
  Administered 2019-06-29: 3 [IU] via SUBCUTANEOUS
  Administered 2019-06-29: 5 [IU] via SUBCUTANEOUS
  Filled 2019-06-28 (×3): qty 1

## 2019-06-28 MED ORDER — INSULIN GLARGINE 100 UNIT/ML ~~LOC~~ SOLN
10.0000 [IU] | Freq: Every day | SUBCUTANEOUS | Status: DC
  Administered 2019-06-28 – 2019-06-29 (×2): 10 [IU] via SUBCUTANEOUS
  Filled 2019-06-28 (×3): qty 0.1

## 2019-06-28 MED ORDER — FAMOTIDINE IN NACL 20-0.9 MG/50ML-% IV SOLN
20.0000 mg | Freq: Once | INTRAVENOUS | Status: AC
  Administered 2019-06-28: 03:00:00 20 mg via INTRAVENOUS
  Filled 2019-06-28: qty 50

## 2019-06-28 MED ORDER — ICOSAPENT ETHYL 1 G PO CAPS: 2.0000 g | ORAL_CAPSULE | Freq: Two times a day (BID) | ORAL | Status: DC

## 2019-06-28 MED ORDER — METOCLOPRAMIDE HCL 5 MG/ML IJ SOLN
10.0000 mg | Freq: Four times a day (QID) | INTRAMUSCULAR | Status: DC
  Administered 2019-06-28 – 2019-06-29 (×5): 10 mg via INTRAVENOUS
  Filled 2019-06-28 (×5): qty 2

## 2019-06-28 MED ORDER — POTASSIUM CHLORIDE 10 MEQ/100ML IV SOLN
10.0000 meq | INTRAVENOUS | Status: AC
  Administered 2019-06-28: 10 meq via INTRAVENOUS
  Filled 2019-06-28 (×2): qty 100

## 2019-06-28 MED ORDER — LABETALOL HCL 5 MG/ML IV SOLN
INTRAVENOUS | Status: AC
  Filled 2019-06-28: qty 4

## 2019-06-28 MED ORDER — PANTOPRAZOLE SODIUM 40 MG IV SOLR
40.0000 mg | Freq: Two times a day (BID) | INTRAVENOUS | Status: DC
  Administered 2019-06-28 – 2019-06-29 (×3): 40 mg via INTRAVENOUS
  Filled 2019-06-28 (×3): qty 40

## 2019-06-28 MED ORDER — VITAMIN D 25 MCG (1000 UNIT) PO TABS
5000.0000 [IU] | ORAL_TABLET | Freq: Every day | ORAL | Status: DC
  Administered 2019-06-29: 5000 [IU] via ORAL
  Filled 2019-06-28: qty 5

## 2019-06-28 MED ORDER — ONDANSETRON HCL 4 MG/2ML IJ SOLN
4.0000 mg | Freq: Once | INTRAMUSCULAR | Status: AC
  Administered 2019-06-28: 04:00:00 4 mg via INTRAVENOUS

## 2019-06-28 MED ORDER — SODIUM CHLORIDE 0.9% FLUSH: 3.0000 mL | Freq: Once | INTRAVENOUS | Status: DC

## 2019-06-28 MED ORDER — LACTATED RINGERS IV SOLN: INTRAVENOUS | Status: DC

## 2019-06-28 MED ORDER — ONDANSETRON HCL 4 MG/2ML IJ SOLN
INTRAMUSCULAR | Status: AC
  Filled 2019-06-28: qty 2

## 2019-06-28 MED ORDER — FOLIC ACID 1 MG PO TABS
1.0000 mg | ORAL_TABLET | Freq: Every day | ORAL | Status: DC
  Administered 2019-06-28 – 2019-06-29 (×2): 1 mg
  Filled 2019-06-28 (×2): qty 1

## 2019-06-28 MED ORDER — THIAMINE HCL 100 MG PO TABS
100.0000 mg | ORAL_TABLET | Freq: Every day | ORAL | Status: DC
  Administered 2019-06-28 – 2019-06-29 (×2): 100 mg via ORAL
  Filled 2019-06-28 (×2): qty 1

## 2019-06-28 NOTE — ED Notes (Signed)
Pt reports nausea returning

## 2019-06-28 NOTE — ED Notes (Signed)
Pt denies N/V at this time. Reports abd discomfort minimal but present. Diet orders obtained. Pt given diet ginger ale per pt request.

## 2019-06-28 NOTE — ED Notes (Signed)
CRITICAL LAB: pH is 7.19, Lisa, RT, Dr. Don Perking notified, orders received

## 2019-06-28 NOTE — ED Notes (Addendum)
ED TO INPATIENT HANDOFF REPORT  ED Nurse Name and Phone #:   Elijah Birk RN   [phone]  S Name/Age/Gender Randy Deleon 54 y.o. male Room/Bed: ED24A/ED24A  Code Status   Code Status: Full Code  Home/SNF/Other Home Patient oriented to: self, place, time and situation Is this baseline? Yes   Triage Complete: Triage complete  Chief Complaint DKA (diabetic ketoacidoses) (HCC) [E11.10]  Triage Note Pt says he has had vomiting since around 2PM, associated with upper abdominal pain. He is a diabetic, reports last sugar at home was 180, has not had his medications today (takes oral and insulin doses).     Allergies Allergies  Allergen Reactions  . Contrast Media [Iodinated Diagnostic Agents]     Renal failure  . Other Rash and Other (See Comments)    Tested allergic to LIMA BEANS  . Shellfish Allergy Rash and Other (See Comments)    TESTED ALLERGIC TO "SHELLFISH"    Level of Care/Admitting Diagnosis ED Disposition    ED Disposition Condition Comment   Admit  Hospital Area: [facility] [[id number]]  Level of Care: Med-Surg [[id number]]  Covid Evaluation: Confirmed COVID Negative  Diagnosis: DKA (diabetic ketoacidoses) [facility]) [[id number]]  Admitting Physician: Charise Killian [[id number]]  Attending Physician: Charise Killian [[id number]]  Estimated length of stay: past midnight tomorrow  Certification:: I certify this patient will need inpatient services for at least 2 midnights       B Medical/Surgery History Past Medical History:  Diagnosis Date  . Diabetes mellitus without complication (HCC)   . Hypertension    Past Surgical History:  Procedure Laterality Date  . none       A IV Location/Drains/Wounds Patient Lines/Drains/Airways Status   Active Line/Drains/Airways    Name:   Placement date:   Placement time:   Site:   Days:   Peripheral IV 06/28/19 Left Wrist   06/28/19    0249    Wrist   less than 1   Peripheral IV 06/28/19 Left Forearm   06/28/19     0700    Forearm   less than 1          Intake/Output Last 24 hours  Intake/Output Summary (Last 24 hours) at 06/28/2019 2024 Last data filed at 06/28/2019 9924 Gross per 24 hour  Intake 2302.16 ml  Output --  Net 2302.16 ml    Labs/Imaging Results for orders placed or performed during the hospital encounter of 06/28/19 (from the past 48 hour(s))  Lipase, blood     Status: Abnormal   Collection Time: 06/28/19  2:06 AM  Result Value Ref Range   Lipase 351 (H) 11 - 51 U/L    Comment: RESULT CONFIRMED BY MANUAL DILUTION [facility] Performed at [facility], [address] [place], [place] [address]   Comprehensive metabolic panel     Status: Abnormal   Collection Time: 06/28/19  2:06 AM  Result Value Ref Range   Sodium 134 (L) 135 - 145 mmol/L    Comment: REPEATED ELECTROLYTES SRC   Potassium 4.6 3.5 - 5.1 mmol/L    Comment: HEMOLYSIS AT THIS LEVEL MAY AFFECT RESULT   Chloride 100 98 - 111 mmol/L   CO2 9 (L) 22 - 32 mmol/L   Glucose, Bld 261 (H) 70 - 99 mg/dL    Comment: Glucose reference range applies only to samples taken after fasting for at least 8 hours.   BUN 11 6 - 20 mg/dL   Creatinine, Ser 1.96 0.61 -  1.24 mg/dL   Calcium 9.6 8.9 - 10.3 mg/dL   Total Protein 8.5 (H) 6.5 - 8.1 g/dL   Albumin 4.8 3.5 - 5.0 g/dL   AST 24 15 - 41 U/L   ALT 28 0 - 44 U/L   Alkaline Phosphatase 66 38 - 126 U/L   Total Bilirubin 2.9 (H) 0.3 - 1.2 mg/dL   GFR calc non Af Amer >60 >60 mL/min   GFR calc Af Amer >60 >60 mL/min   Anion gap 25 (H) 5 - 15    Comment: Performed at [facility], [address] [place], [place] [address]  CBC     Status: Abnormal   Collection Time: 06/28/19  2:06 AM  Result Value Ref Range   WBC 10.8 (H) 4.0 - 10.5 K/uL   RBC 5.18 4.22 - 5.81 MIL/uL   Hemoglobin 17.5 (H) 13.0 - 17.0 g/dL   HCT 51.9 39.0 - 52.0 %   MCV 100.2 (H) 80.0 - 100.0 fL   MCH 33.8 26.0 - 34.0 pg   MCHC 33.7 30.0 - 36.0 g/dL   RDW 11.9 11.5 - 15.5 %   Platelets 124  (L) 150 - 400 K/uL    Comment: Immature Platelet Fraction may be clinically indicated, consider ordering this additional test [id number]    nRBC 0.0 0.0 - 0.2 %    Comment: Performed at [facility], [address] [place], [place] [address]  Glucose, capillary     Status: Abnormal   Collection Time: 06/28/19  2:11 AM  Result Value Ref Range   Glucose-Capillary 255 (H) 70 - 99 mg/dL    Comment: Glucose reference range applies only to samples taken after fasting for at least 8 hours.  Blood gas, venous     Status: Abnormal   Collection Time: 06/28/19  2:19 AM  Result Value Ref Range   pH, Ven 7.19 (LL) 7.250 - 7.430    Comment: CRITICAL RESULT CALLED TO, READ BACK BY AND VERIFIED WITH: NOEL WEBSTER,RN 0315  06/28/19 LKM    pCO2, Ven 25 (L) 44.0 - 60.0 mmHg   pO2, Ven 78.0 (H) 32.0 - 45.0 mmHg   Bicarbonate 9.5 (L) 20.0 - 28.0 mmol/L   Acid-base deficit 17.0 (H) 0.0 - 2.0 mmol/L   O2 Saturation 91.7 %   Patient temperature 37.0    Collection site VEIN    Sample type VENOUS     Comment: Performed at [facility], [address] [place], [place] [address]  Urinalysis, Complete w Microscopic     Status: Abnormal   Collection Time: 06/28/19  4:30 AM  Result Value Ref Range   Color, Urine STRAW (A) YELLOW   APPearance CLEAR (A) CLEAR   Specific Gravity, Urine 1.022 1.005 - 1.030   pH 5.0 5.0 - 8.0   Glucose, UA >=500 (A) NEGATIVE mg/dL   Hgb urine dipstick MODERATE (A) NEGATIVE   Bilirubin Urine NEGATIVE NEGATIVE   Ketones, ur 80 (A) NEGATIVE mg/dL   Protein, ur 100 (A) NEGATIVE mg/dL   Nitrite NEGATIVE NEGATIVE   Leukocytes,Ua NEGATIVE NEGATIVE   WBC, UA 0-5 0 - 5 WBC/hpf   Bacteria, UA NONE SEEN NONE SEEN   Squamous Epithelial / LPF NONE SEEN 0 - 5   Mucus PRESENT    Granular Casts, UA PRESENT     Comment: Performed at [facility], [address], [place], [place] [address]  Ethanol     Status: None   Collection Time: 06/28/19  4:37 AM   Result Value Ref Range   Alcohol, Ethyl (B) <10 <10 mg/dL    Comment: (NOTE) Lowest detectable limit for serum alcohol is 10 mg/dL. For medical purposes only. Performed at [facility], [address] [place], [place] [address]   Beta-hydroxybutyric acid     Status: Abnormal   Collection Time: 06/28/19  4:37 AM  Result Value Ref Range   Beta-Hydroxybutyric Acid >8.00 (H) 0.05 - 0.27 mmol/L    Comment: RESULT CONFIRMED BY MANUAL DILUTION / JAG Performed at [facility], [address] [place], [place] [address]   Respiratory Panel by RT PCR (Flu A&B, Covid) - Nasopharyngeal Swab     Status: None   Collection Time: 06/28/19  4:37 AM   Specimen: Nasopharyngeal Swab  Result Value Ref Range   SARS Coronavirus 2 by RT PCR NEGATIVE NEGATIVE    Comment: (NOTE) SARS-CoV-2 target nucleic acids are NOT DETECTED. The SARS-CoV-2 RNA is generally detectable in upper respiratoy specimens during the acute phase of infection. The lowest concentration of SARS-CoV-2 viral copies this assay can detect is 131 copies/mL. A negative result does not preclude SARS-Cov-2 infection and should not be used as the sole basis for treatment or other patient management decisions. A negative result may occur with  improper specimen collection/handling, submission of specimen other than nasopharyngeal swab, presence of viral mutation(s) within the areas targeted by this assay, and inadequate number of viral copies (<131 copies/mL). A negative result must be combined with clinical observations, patient history, and epidemiological information. The expected result is Negative. Fact Sheet for Patients:  https://www.moore.com/ Fact Sheet for Healthcare Providers:  https://www.young.biz/ This test is not yet ap proved or cleared by the [place] FDA and  has been authorized for detection and/or diagnosis of SARS-CoV-2 by FDA under an Emergency Use  Authorization (EUA). This EUA will remain  in effect (meaning this test can be used) for the duration of the COVID-19 declaration under Section 564(b)(1) of the Act, 21 U.S.C. section 360bbb-3(b)(1), unless the authorization is terminated or revoked sooner.    Influenza A by PCR NEGATIVE NEGATIVE   Influenza B by PCR NEGATIVE NEGATIVE    Comment: (NOTE) The Xpert Xpress SARS-CoV-2/FLU/RSV assay is intended as an aid in  the diagnosis of influenza from Nasopharyngeal swab specimens and  should not be used as a sole basis for treatment. Nasal washings and  aspirates are unacceptable for Xpert Xpress SARS-CoV-2/FLU/RSV  testing. Fact Sheet for Patients: https://www.moore.com/ Fact Sheet for Healthcare Providers: https://www.young.biz/ This test is not yet approved or cleared by the [place] FDA and  has been authorized for detection and/or diagnosis of SARS-CoV-2 by  FDA under an Emergency Use Authorization (EUA). This EUA will remain  in effect (meaning this test can be used) for the duration of the  Covid-19 declaration under Section 564(b)(1) of the Act, 21  U.S.C. section 360bbb-3(b)(1), unless the authorization is  terminated or revoked. Performed at [facility], [address] [place], [place] [address]   Glucose, capillary     Status: Abnormal   Collection Time: 06/28/19  5:35 AM  Result Value Ref Range   Glucose-Capillary 278 (H) 70 - 99 mg/dL    Comment: Glucose reference range applies only to samples taken after fasting for at least 8 hours.  HIV Antibody (routine testing w rflx)     Status: None   Collection Time: 06/28/19  6:27 AM  Result Value Ref Range   HIV Screen 4th  Generation wRfx NON REACTIVE NON REACTIVE    Comment: Performed at [facility], [address] [place], [place] [address]  Basic metabolic panel     Status: Abnormal   Collection Time: 06/28/19  6:27 AM  Result Value Ref Range   Sodium  133 (L) 135 - 145 mmol/L   Potassium 4.3 3.5 - 5.1 mmol/L    Comment: HEMOLYSIS AT THIS LEVEL MAY AFFECT RESULT   Chloride 103 98 - 111 mmol/L   CO2 10 (L) 22 - 32 mmol/L   Glucose, Bld 213 (H) 70 - 99 mg/dL    Comment: Glucose reference range applies only to samples taken after fasting for at least 8 hours.   BUN 10 6 - 20 mg/dL   Creatinine, Ser 6.041.04 0.61 - 1.24 mg/dL   Calcium 9.0 8.9 - 54.010.3 mg/dL   GFR calc non Af Amer >60 >60 mL/min   GFR calc Af Amer >60 >60 mL/min   Anion gap 20 (H) 5 - 15    Comment: Performed at [facility], [address] [place], [place] [address]  Beta-hydroxybutyric acid     Status: Abnormal   Collection Time: 06/28/19  6:27 AM  Result Value Ref Range   Beta-Hydroxybutyric Acid >8.00 (H) 0.05 - 0.27 mmol/L    Comment: RESULT CONFIRMED BY MANUAL DILUTION / JAG Performed at [facility], [address] [place], [place] [address]   Glucose, capillary     Status: Abnormal   Collection Time: 06/28/19  6:42 AM  Result Value Ref Range   Glucose-Capillary 234 (H) 70 - 99 mg/dL    Comment: Glucose reference range applies only to samples taken after fasting for at least 8 hours.  Glucose, capillary     Status: Abnormal   Collection Time: 06/28/19  8:00 AM  Result Value Ref Range   Glucose-Capillary 192 (H) 70 - 99 mg/dL    Comment: Glucose reference range applies only to samples taken after fasting for at least 8 hours.  Glucose, capillary     Status: Abnormal   Collection Time: 06/28/19  9:14 AM  Result Value Ref Range   Glucose-Capillary 178 (H) 70 - 99 mg/dL    Comment: Glucose reference range applies only to samples taken after fasting for at least 8 hours.  Glucose, capillary     Status: Abnormal   Collection Time: 06/28/19 10:06 AM  Result Value Ref Range   Glucose-Capillary 195 (H) 70 - 99 mg/dL    Comment: Glucose reference range applies only to samples taken after fasting for at least 8 hours.  Glucose, capillary     Status:  Abnormal   Collection Time: 06/28/19 11:18 AM  Result Value Ref Range   Glucose-Capillary 167 (H) 70 - 99 mg/dL    Comment: Glucose reference range applies only to samples taken after fasting for at least 8 hours.  Glucose, capillary     Status: Abnormal   Collection Time: 06/28/19 12:10 PM  Result Value Ref Range   Glucose-Capillary 177 (H) 70 - 99 mg/dL    Comment: Glucose reference range applies only to samples taken after fasting for at least 8 hours.  Glucose, capillary     Status: Abnormal   Collection Time: 06/28/19  1:05 PM  Result Value Ref Range   Glucose-Capillary 178 (H) 70 - 99 mg/dL    Comment: Glucose reference range applies only to samples taken after fasting for at least 8 hours.  Basic metabolic panel  Status: Abnormal   Collection Time: 06/28/19  1:08 PM  Result Value Ref Range   Sodium 134 (L) 135 - 145 mmol/L   Potassium 4.1 3.5 - 5.1 mmol/L   Chloride 103 98 - 111 mmol/L   CO2 19 (L) 22 - 32 mmol/L   Glucose, Bld 191 (H) 70 - 99 mg/dL    Comment: Glucose reference range applies only to samples taken after fasting for at least 8 hours.   BUN 9 6 - 20 mg/dL   Creatinine, Ser 4.40 0.61 - 1.24 mg/dL   Calcium 9.3 8.9 - 34.7 mg/dL   GFR calc non Af Amer >60 >60 mL/min   GFR calc Af Amer >60 >60 mL/min   Anion gap 12 5 - 15    Comment: Performed at [facility], [address] [place], [place] [address]  Beta-hydroxybutyric acid     Status: Abnormal   Collection Time: 06/28/19  1:40 PM  Result Value Ref Range   Beta-Hydroxybutyric Acid 3.82 (H) 0.05 - 0.27 mmol/L    Comment: Performed at [facility], [address] [place], [place] [address]  Glucose, capillary     Status: Abnormal   Collection Time: 06/28/19  2:43 PM  Result Value Ref Range   Glucose-Capillary 180 (H) 70 - 99 mg/dL    Comment: Glucose reference range applies only to samples taken after fasting for at least 8 hours.  Glucose, capillary     Status: Abnormal    Collection Time: 06/28/19  3:45 PM  Result Value Ref Range   Glucose-Capillary 170 (H) 70 - 99 mg/dL    Comment: Glucose reference range applies only to samples taken after fasting for at least 8 hours.  Glucose, capillary     Status: Abnormal   Collection Time: 06/28/19  4:53 PM  Result Value Ref Range   Glucose-Capillary 192 (H) 70 - 99 mg/dL    Comment: Glucose reference range applies only to samples taken after fasting for at least 8 hours.  Glucose, capillary     Status: Abnormal   Collection Time: 06/28/19  6:37 PM  Result Value Ref Range   Glucose-Capillary 219 (H) 70 - 99 mg/dL    Comment: Glucose reference range applies only to samples taken after fasting for at least 8 hours.   US ABDOMEN LIMITED RUQ  Result Date: 06/28/2019 CLINICAL DATA:  Pancreatitis.  Evaluate for gallstones EXAM: ULTRASOUND ABDOMEN LIMITED RIGHT UPPER QUADRANT COMPARISON:  CT 03/22/2018 FINDINGS: Gallbladder: No gallstones or wall thickening visualized. No sonographic Murphy sign noted by sonographer. Common bile duct: Diameter: Normal at 4 mm. Liver: No focal lesion identified. Within normal limits in parenchymal echogenicity. Portal vein is patent on color Doppler imaging with normal direction of blood flow towards the liver. Other: None. IMPRESSION: 1. No cholelithiasis or choledocholithiasis. 2. No acute findings by abdominal ultrasound Electronically Signed   By: Genevive Bi M.D.   On: 06/28/2019 05:02    Pending Labs Unresulted Labs (From admission, onward)    Start     Ordered   06/29/19 0500  CBC  Daily,   STAT     06/28/19 1438   06/29/19 0500  Basic metabolic panel  Daily,   STAT     06/28/19 1439   06/28/19 0703  Basic metabolic panel  ONCE - STAT,   STAT     06/28/19 0702   06/28/19 0521  Basic metabolic panel  (Diabetes Ketoacidosis (DKA))  STAT Now then every 4 hours ,  STAT     06/28/19 0524   06/28/19 0521  Beta-hydroxybutyric acid  (Diabetes Ketoacidosis (DKA))  Now then every 8  hours,   STAT     06/28/19 0524   06/28/19 0521  Hemoglobin A1c  (Diabetes Ketoacidosis (DKA))  Once,   STAT    Comments: To assess prior glycemic control.    06/28/19 0524   06/28/19 0403  Beta-hydroxybutyric acid  (Diabetes Ketoacidosis (DKA))  Now then every 8 hours,   STAT     06/28/19 0403          Vitals/Pain Today's Vitals   06/28/19 1830 06/28/19 1900 06/28/19 2000 06/28/19 2022  BP: (!) 176/97 127/83 (!) 178/101 (!) 143/71  Pulse:  (!) 113 96 99  Resp: (!) 21 (!) 21 (!) 21 (!) 21  Temp:      TempSrc:      SpO2:  94% 94% 96%  Weight:      Height:      PainSc:        Isolation Precautions No active isolations  Medications Medications  dextrose 50 % solution 0-50 mL (has no administration in time range)  potassium chloride 10 mEq in 100 mL IVPB (10 mEq Intravenous Not Given 06/28/19 0619)  amLODipine (NORVASC) tablet 5 mg (5 mg Oral Given 06/28/19 1007)  folic acid (FOLVITE) tablet 1 mg (1 mg Per Tube Given 06/28/19 1007)  cholecalciferol (VITAMIN D3) tablet 5,000 Units (5,000 Units Oral Not Given 06/28/19 1009)  thiamine tablet 100 mg (100 mg Oral Given 06/28/19 1007)  enoxaparin (LOVENOX) injection 40 mg (40 mg Subcutaneous Given 06/28/19 0628)  acetaminophen (TYLENOL) tablet 650 mg (has no administration in time range)  alum & mag hydroxide-simeth (MAALOX/MYLANTA) 200-200-20 MG/5ML suspension 30 mL (has no administration in time range)  ondansetron (ZOFRAN) injection 4 mg (has no administration in time range)  metoCLOPramide (REGLAN) injection 10 mg (10 mg Intravenous Not Given 06/28/19 1853)  pantoprazole (PROTONIX) injection 40 mg (40 mg Intravenous Given 06/28/19 0645)  traZODone (DESYREL) tablet 25 mg (has no administration in time range)  lactated ringers infusion ( Intravenous New Bag/Given 06/28/19 1628)  dextrose 5 % in lactated ringers infusion ( Intravenous New Bag/Given 06/28/19 0731)  omega-3 acid ethyl esters (LOVAZA) capsule 2 g (2 g Oral Given 06/28/19  1008)  scopolamine (TRANSDERM-SCOP) 1 MG/3DAYS 1.5 mg (1.5 mg Transdermal Patch Applied 06/28/19 1150)  insulin glargine (LANTUS) injection 10 Units (10 Units Subcutaneous Given 06/28/19 1722)  insulin aspart (novoLOG) injection 0-9 Units (2 Units Subcutaneous Given 06/28/19 1657)  labetalol (NORMODYNE) injection 20 mg (20 mg Intravenous Given 06/28/19 1900)  ondansetron (ZOFRAN) injection 4 mg (4 mg Intravenous Given 06/28/19 0250)  lactated ringers bolus 1,000 mL (0 mLs Intravenous Stopped 06/28/19 0542)  famotidine (PEPCID) IVPB 20 mg premix (0 mg Intravenous Stopped 06/28/19 0321)  lactated ringers bolus 1,000 mL (0 mLs Intravenous Stopped 06/28/19 0542)  ondansetron (ZOFRAN) injection 4 mg (4 mg Intravenous Given 06/28/19 0341)  ondansetron (ZOFRAN) injection 4 mg (4 mg Intravenous Given 06/28/19 0916)    Mobility walks Low fall risk   Focused Assessments Cardiac Assessment Handoff:  Cardiac Rhythm: Sinus tachycardia Lab Results  Component Value Date   TROPONINI <0.03 03/20/2018   No results found for: DDIMER Does the Patient currently have chest pain? No     R Recommendations: See Admitting Provider Note  Report given to:  Norberta Keens on [facility]  Additional Notes:

## 2019-06-28 NOTE — ED Notes (Signed)
Pt not tolerating diet at this time. Emesis x1 after PO fluids.

## 2019-06-28 NOTE — ED Notes (Signed)
Assisted pt to bedside commode. Pt denies complaints.

## 2019-06-28 NOTE — ED Notes (Signed)
Pt offered warm blanket and informed to notify w/ call bell when able to give UA sample. Pt requesting water-provided ice chips and educated on current NPO status. Pt verbalizes understanding.

## 2019-06-28 NOTE — ED Provider Notes (Signed)
[facility] Emergency Department Provider Note  ____________________________________________  Time seen: Approximately 4:26 AM  I have reviewed the triage vital signs and the nursing notes.   HISTORY  Chief Complaint Emesis   HPI Randy Deleon is a 54 y.o. male history of type 2 diabetes, hypertension, alcohol abuse, pancreatitis who presents for evaluation of abdominal pain, nausea vomiting.  Symptoms started around 2 PM.  Patient denies any alcohol today.  Complaining of sharp epigastric pain radiating to his back which is similar to prior episodes of pancreatitis.  Has had over a dozen episodes of nonbloody nonbilious emesis.  No diarrhea, no constipation, no abdominal distention, patient is passing flatus.  No prior abdominal surgeries.  No fever or chills, no cough, no chest pain or shortness of breath.  His pain is currently severe, constant and nonradiating.  Reports that his last sugar at home was 180.  Has not had any of his medications today.   Past Medical History:  Diagnosis Date  . Diabetes mellitus without complication ([place])   . Hypertension     Patient Active Problem List   Diagnosis Date Noted  . AKI (acute kidney injury) ([place]) 03/23/2018  . Acute pancreatitis 03/23/2018  . Pancreatitis, alcoholic, acute 16/12/9602  . Respiratory failure, acute ([place]) 03/21/2018  . DKA (diabetic ketoacidoses) ([place]) 03/20/2018  . DKA, type 1 ([place]) 11/23/2017  . Fatigue 11/19/2017  . Abnormal weight loss 11/19/2017    Past Surgical History:  Procedure Laterality Date  . none      Prior to Admission medications   Medication Sig Start Date End Date Taking? Authorizing Provider  amLODipine (NORVASC) 5 MG tablet Take 1 tablet (5 mg total) by mouth daily. 03/31/18   Mikhail, Velta Addison, DO  blood glucose meter kit and supplies Dispense based on patient and insurance preference. Use up to four times daily as directed. (FOR ICD-10 E10.9, E11.9). 03/30/18    Cristal Ford, DO  Cholecalciferol (VITAMIN D3) 125 MCG (5000 UT) CAPS Take 5,000 Units by mouth daily.    [provider]  feeding supplement, ENSURE ENLIVE, (ENSURE ENLIVE) LIQD Take 237 mLs by mouth 2 (two) times daily between meals. 03/30/18   Mikhail, Velta Addison, DO  folic acid (FOLVITE) 1 MG tablet Place 1 tablet (1 mg total) into feeding tube daily. 03/31/18   Mikhail, Velta Addison, DO  Icosapent Ethyl (VASCEPA) 1 g CAPS Take 2 g by mouth 2 (two) times daily.    [provider]  insulin aspart (NOVOLOG) 100 UNIT/ML FlexPen Use TID with meals and bedtime: CBG 121-150: 3u; CBG 151-200: 4u; CBG 201-250: 7u; CBG 251-300: 11u; CBG 301- 350: 15u; CBG 351-400: 20u 03/30/18   Mikhail, [place], DO  Insulin Glargine (LANTUS) 100 UNIT/ML Solostar Pen Inject 15 Units into the skin daily. 03/30/18   Mikhail, Velta Addison, DO  Insulin Pen Needle 31G X 5 MM MISC Use with insulin pens. 03/30/18   Mikhail, Velta Addison, DO  nicotine (NICODERM CQ - DOSED IN MG/24 HOURS) 21 mg/24hr patch Place 1 patch (21 mg total) onto the skin daily. 03/23/18   Flora Lipps, MD  ondansetron (ZOFRAN) 4 MG tablet Take 1 tablet (4 mg total) by mouth every 6 (six) hours as needed for nausea. 03/22/18   Flora Lipps, MD  thiamine 100 MG tablet Place 1 tablet (100 mg total) into feeding tube daily. 03/31/18   Cristal Ford, DO    Allergies Contrast media [iodinated diagnostic agents], Other, and [place] allergy  Family History  Problem Relation Age  of Onset  . Diabetes Mother   . Hypertension Mother   . Congestive Heart Failure Father   . Diabetes Father   . Hypertension Father     Social History Social History   Tobacco Use  . Smoking status: Current Every Day Smoker    Packs/day: 0.75    Years: 20.00    Pack years: 15.00    Types: Cigarettes    Start date: 01/29/1997  . Smokeless tobacco: Never Used  . Tobacco comment: Refer to [facility]  Substance Use Topics  . Alcohol use: Not Currently  . Drug use: Not  Currently    Review of Systems  Constitutional: Negative for fever. Eyes: Negative for visual changes. ENT: Negative for sore throat. Neck: No neck pain  Cardiovascular: Negative for chest pain. Respiratory: Negative for shortness of breath. Gastrointestinal: + epigastric abdominal pain, nausea, and vomiting. No diarrhea. Genitourinary: Negative for dysuria. Musculoskeletal: Negative for back pain. Skin: Negative for rash. Neurological: Negative for headaches, weakness or numbness. Psych: No SI or HI  ____________________________________________   PHYSICAL EXAM:  VITAL SIGNS: ED Triage Vitals  Enc Vitals Group     BP 06/28/19 0158 (!) 161/93     Pulse Rate 06/28/19 0158 (!) 131     Resp 06/28/19 0158 (!) 25     Temp 06/28/19 0158 98.4 F (36.9 C)     Temp Source 06/28/19 0158 Oral     SpO2 06/28/19 0158 96 %     Weight --      Height --      Head Circumference --      Peak Flow --      Pain Score 06/28/19 0337 8     Pain Loc --      Pain Edu? --      Excl. in [place]? --     Constitutional: Alert and oriented. Well appearing and in no apparent distress. HEENT:      Head: Normocephalic and atraumatic.         Eyes: Conjunctivae are normal. Sclera is non-icteric.       Mouth/Throat: Mucous membranes are dry.       Neck: Supple with no signs of meningismus. Cardiovascular: Tachycardic with regular rhythm Respiratory: Tachypneic. Lungs are clear to auscultation bilaterally. No wheezes, crackles, or rhonchi.  Gastrointestinal: Soft, tender to palpation over the epigastric region, and non distended with positive bowel sounds. No rebound or guarding. Genitourinary: No CVA tenderness. Musculoskeletal: No edema, cyanosis, or erythema of extremities. Neurologic: Normal speech and language. Face is symmetric. Moving all extremities. No gross focal neurologic deficits are appreciated. Skin: Skin is warm, dry and intact. No rash noted. Psychiatric: Mood and affect are normal.  Speech and behavior are normal.  ____________________________________________   LABS (all labs ordered are listed, but only abnormal results are displayed)  Labs Reviewed  LIPASE, BLOOD - Abnormal; Notable for the following components:      Result Value   Lipase 351 (*)    All other components within normal limits  COMPREHENSIVE METABOLIC PANEL - Abnormal; Notable for the following components:   Sodium 134 (*)    CO2 9 (*)    Glucose, Bld 261 (*)    Total Protein 8.5 (*)    Total Bilirubin 2.9 (*)    Anion gap 25 (*)    All other components within normal limits  CBC - Abnormal; Notable for the following components:   WBC 10.8 (*)    Hemoglobin 17.5 (*)  MCV 100.2 (*)    Platelets 124 (*)    All other components within normal limits  URINALYSIS, COMPLETE (UACMP) WITH MICROSCOPIC - Abnormal; Notable for the following components:   Color, Urine STRAW (*)    APPearance CLEAR (*)    Glucose, UA >=500 (*)    Hgb urine dipstick MODERATE (*)    Ketones, ur 80 (*)    Protein, ur 100 (*)    All other components within normal limits  GLUCOSE, CAPILLARY - Abnormal; Notable for the following components:   Glucose-Capillary 255 (*)    All other components within normal limits  BLOOD GAS, VENOUS - Abnormal; Notable for the following components:   pH, Ven 7.19 (*)    pCO2, Ven 25 (*)    pO2, Ven 78.0 (*)    Bicarbonate 9.5 (*)    Acid-base deficit 17.0 (*)    All other components within normal limits  GLUCOSE, CAPILLARY - Abnormal; Notable for the following components:   Glucose-Capillary 278 (*)    All other components within normal limits  RESPIRATORY PANEL BY RT PCR (FLU A&B, COVID)  ETHANOL  BETA-HYDROXYBUTYRIC ACID  BETA-HYDROXYBUTYRIC ACID  BETA-HYDROXYBUTYRIC ACID  HIV ANTIBODY (ROUTINE TESTING W REFLEX)  BASIC METABOLIC PANEL  BASIC METABOLIC PANEL  BASIC METABOLIC PANEL  BASIC METABOLIC PANEL  BASIC METABOLIC PANEL  BETA-HYDROXYBUTYRIC ACID  BETA-HYDROXYBUTYRIC  ACID  BETA-HYDROXYBUTYRIC ACID  HEMOGLOBIN A1C  CBG MONITORING, ED   ____________________________________________  EKG  ED ECG REPORT I, Rudene Re, the attending physician, personally viewed and interpreted this ECG.  Sinus tachycardia, rate of 123, normal intervals, left axis deviation, LVH with repolarization abnormalities, no ST elevations. ____________________________________________  RADIOLOGY  I have personally reviewed the images performed during this visit and I agree with the Radiologist's read.   Interpretation by Radiologist:  US ABDOMEN LIMITED RUQ  Result Date: 06/28/2019 CLINICAL DATA:  Pancreatitis.  Evaluate for gallstones EXAM: ULTRASOUND ABDOMEN LIMITED RIGHT UPPER QUADRANT COMPARISON:  CT 03/22/2018 FINDINGS: Gallbladder: No gallstones or wall thickening visualized. No sonographic Murphy sign noted by sonographer. Common bile duct: Diameter: Normal at 4 mm. Liver: No focal lesion identified. Within normal limits in parenchymal echogenicity. Portal vein is patent on color Doppler imaging with normal direction of blood flow towards the liver. Other: None. IMPRESSION: 1. No cholelithiasis or choledocholithiasis. 2. No acute findings by abdominal ultrasound Electronically Signed   By: Suzy Bouchard M.D.   On: 06/28/2019 05:02      ____________________________________________   PROCEDURES  Procedure(s) performed:yes .1-3 Lead EKG Interpretation Performed by: Rudene Re, MD Authorized by: Rudene Re, MD     Interpretation: normal     ECG rate:  100   ECG rate assessment: tachycardic     Rhythm: sinus tachycardia     Ectopy: none     Conduction: normal     Critical Care performed: yes  CRITICAL CARE Performed by: Rudene Re  ?  Total critical care time: 40 min  Critical care time was exclusive of separately billable procedures and treating other patients.  Critical care was necessary to treat or prevent imminent or  life-threatening deterioration.  Critical care was time spent personally by me on the following activities: development of treatment plan with patient and/or surrogate as well as nursing, discussions with consultants, evaluation of patient's response to treatment, examination of patient, obtaining history from patient or surrogate, ordering and performing treatments and interventions, ordering and review of laboratory studies, ordering and review of radiographic studies, pulse oximetry and  re-evaluation of patient's condition.  ____________________________________________   INITIAL IMPRESSION / ASSESSMENT AND PLAN / ED COURSE   54 y.o. male history of type 2 diabetes, hypertension, alcohol abuse, pancreatitis who presents for evaluation of abdominal pain, nausea, and vomiting.  Patient looks very dry on exam after more than 12 episodes of nonbloody nonbilious emesis, tachypneic and tachycardic, abdomen is soft and nondistended with epigastric tenderness, no rebound or guarding.  EKG showing sinus tachycardia with no acute ischemic changes.  Patient was placed on telemetry for monitoring of his cardiac status.  Labs consistent with DKA with a pH of 7.19, blood glucose of 261, bicarb of 9, and anion gap of 25.  Lipase is elevated consistent with acute on chronic pancreatitis.  Possibly from alcohol consumption although ethanol level is pending.  RUQ [place] negative for signs of gallstone pancreatitis visualized and evaluated by me and confirmed by radiology.  Patient was given 2 L of LR bolus and started on insulin drip.  Review of old medical records show several prior admissions for DKA in the setting of alcohol abuse. Will discuss with hospitalist for admission.   Discussed with Dr. Sidney Ace who accepted admission.    _____________________________________________ Please note:  Patient was evaluated in Emergency Department today for the symptoms described in the history of present illness. Patient was  evaluated in the context of the global COVID-19 pandemic, which necessitated consideration that the patient might be at risk for infection with the SARS-CoV-2 virus that causes COVID-19. Institutional protocols and algorithms that pertain to the evaluation of patients at risk for COVID-19 are in a state of rapid change based on information released by regulatory bodies including the CDC and federal and state organizations. These policies and algorithms were followed during the patient's care in the ED.  Some ED evaluations and interventions may be delayed as a result of limited staffing during the pandemic.   [place] Controlled Substance Database was reviewed by me. ____________________________________________   FINAL CLINICAL IMPRESSION(S) / ED DIAGNOSES   Final diagnoses:  Pancreatitis  Diabetic ketoacidosis without coma associated with type 2 diabetes mellitus ([place])      NEW MEDICATIONS STARTED DURING THIS VISIT:  ED Discharge Orders    None       Note:  This document was prepared using Dragon voice recognition software and may include unintentional dictation errors.    Alfred Levins, [place], MD 06/28/19 [phone]

## 2019-06-28 NOTE — ED Notes (Signed)
US at bedside

## 2019-06-28 NOTE — ED Notes (Signed)
See triage note. Pt appears uncomfortable. Pt reports fatigue, nausea, and stomach upset. Pt AOX4.

## 2019-06-28 NOTE — Progress Notes (Signed)
Inpatient Diabetes Program Recommendations  AACE/ADA: New Consensus Statement on Inpatient Glycemic Control (2015)  Target Ranges:  Prepandial:   less than 140 mg/dL      Peak postprandial:   less than 180 mg/dL (1-2 hours)      Critically ill patients:  140 - 180 mg/dL   Lab Results  Component Value Date   GLUCAP 178 (H) 06/28/2019   HGBA1C 12.3 (H) 11/20/2017    Review of Glycemic Control  Diabetes history: DM 2 Outpatient Diabetes medications: Lantus 15 units Daily, novolog 3-20 units tid, Invokana per H&P (SGLT2) Current orders for Inpatient glycemic control:  IV insulin/Endotool DKA  Inpatient Diabetes Program Recommendations:    Noted pt's on SGLT2 inhibitors at high risk of DKA in the setting of dehydration. Glucose 261 on admission CO2 10. IV insulin for now. Would not restart Invokana at time of d/c and have pt follow up with MD who prescribed it.  DKA will take longer to clear on these meds and sometimes takes more dextrose in fluids.  Will follow.  Thanks,  Christena Deem RN, MSN, BC-ADM Inpatient Diabetes Coordinator Team Pager [phone] (8a-5p)

## 2019-06-28 NOTE — ED Triage Notes (Signed)
Pt says he has had vomiting since around 2PM, associated with upper abdominal pain. He is a diabetic, reports last sugar at home was 180, has not had his medications today (takes oral and insulin doses).

## 2019-06-28 NOTE — Progress Notes (Signed)
PROGRESS NOTE    Randy Deleon  NWG:[id number] DOB: [birth year] DOA: 06/28/2019 PCP: Sherron Monday, MD     Assessment & Plan:   Active Problems:   DKA (diabetic ketoacidoses) (HCC)  DKA: w/ uncontrolled DM2. Continue on insulin drip as per endo tool until anion gap is closed and then will transition to sq insulin. Continue on IVFs. DM coordinator consulted   Intractable nausea and vomiting: like secondary to early acute pancreatitis likely alcoholic. Zofran, reglan prn for nausea/vomiting. Lipase is elevated. Will start scopolamine patch   Essential hypertension: continue on amlodipine, coreg. IV labetalol prn.  Tobacco abuse: smoking cessation counseling   Possible alcohol abuse: alcohol cessation counseling   Leukocytosis: likely reactive. Will continue to monitor  Thrombocytopenia: etiology unclear. Will continue to monitor     DVT prophylaxis: lovenox Code Status:  Full  Family Communication:  Disposition Plan: will likely d/c back home    Consultants:      Procedures:    Antimicrobials:    Subjective: Pt c/o nausea  Objective: Vitals:   06/28/19 0600 06/28/19 0700 06/28/19 0715 06/28/19 0730  BP: (!) 158/72 (!) 149/88  (!) 155/94  Pulse: (!) 116 (!) 107 (!) 107 (!) 109  Resp: (!) 22 (!) 25 (!) 21 (!) 21  Temp:      TempSrc:      SpO2: 95% 96% 95% 95%  Weight:      Height:        Intake/Output Summary (Last 24 hours) at 06/28/2019 0811 Last data filed at 06/28/2019 0651 Gross per 24 hour  Intake 2302.16 ml  Output --  Net 2302.16 ml   Filed Weights   06/28/19 0530  Weight: 102.1 kg    Examination:  General exam: Appears calm but uncomfortable  Respiratory system: Clear to auscultation. No wheezes, rales Cardiovascular system: S1 & S2. No rubs, gallops or clicks.  Gastrointestinal system: Abdomen is nondistended, soft and nontender. Normal bowel sounds heard. Central nervous system: Alert and oriented. Moves all 4  extremities Psychiatry: Judgement and insight appear normal. Flat mood and affect     Data Reviewed: I have personally reviewed following labs and imaging studies  CBC: Recent Labs  Lab 06/28/19 0206  WBC 10.8*  HGB 17.5*  HCT 51.9  MCV 100.2*  PLT 124*   Basic Metabolic Panel: Recent Labs  Lab 06/28/19 0206  NA 134*  K 4.6  CL 100  CO2 9*  GLUCOSE 261*  BUN 11  CREATININE 1.07  CALCIUM 9.6   GFR: Estimated Creatinine Clearance: 96.2 mL/min (by C-G formula based on SCr of 1.07 mg/dL). Liver Function Tests: Recent Labs  Lab 06/28/19 0206  AST 24  ALT 28  ALKPHOS 66  BILITOT 2.9*  PROT 8.5*  ALBUMIN 4.8   Recent Labs  Lab 06/28/19 0206  LIPASE 351*   No results for input(s): AMMONIA in the last 168 hours. Coagulation Profile: No results for input(s): INR, PROTIME in the last 168 hours. Cardiac Enzymes: No results for input(s): CKTOTAL, CKMB, CKMBINDEX, TROPONINI in the last 168 hours. BNP (last 3 results) No results for input(s): PROBNP in the last 8760 hours. HbA1C: No results for input(s): HGBA1C in the last 72 hours. CBG: Recent Labs  Lab 06/28/19 0211 06/28/19 0535 06/28/19 0642 06/28/19 0800  GLUCAP 255* 278* 234* 192*   Lipid Profile: No results for input(s): CHOL, HDL, LDLCALC, TRIG, CHOLHDL, LDLDIRECT in the last 72 hours. Thyroid Function Tests: No results for input(s): TSH, T4TOTAL, FREET4, T3FREE,  THYROIDAB in the last 72 hours. Anemia Panel: No results for input(s): VITAMINB12, FOLATE, FERRITIN, TIBC, IRON, RETICCTPCT in the last 72 hours. Sepsis Labs: No results for input(s): PROCALCITON, LATICACIDVEN in the last 168 hours.  Recent Results (from the past 240 hour(s))  Respiratory Panel by RT PCR (Flu A&B, Covid) - Nasopharyngeal Swab     Status: None   Collection Time: 06/28/19  4:37 AM   Specimen: Nasopharyngeal Swab  Result Value Ref Range Status   SARS Coronavirus 2 by RT PCR NEGATIVE NEGATIVE Final    Comment:  (NOTE) SARS-CoV-2 target nucleic acids are NOT DETECTED. The SARS-CoV-2 RNA is generally detectable in upper respiratoy specimens during the acute phase of infection. The lowest concentration of SARS-CoV-2 viral copies this assay can detect is 131 copies/mL. A negative result does not preclude SARS-Cov-2 infection and should not be used as the sole basis for treatment or other patient management decisions. A negative result may occur with  improper specimen collection/handling, submission of specimen other than nasopharyngeal swab, presence of viral mutation(s) within the areas targeted by this assay, and inadequate number of viral copies (<131 copies/mL). A negative result must be combined with clinical observations, patient history, and epidemiological information. The expected result is Negative. Fact Sheet for Patients:  https://www.moore.com/ Fact Sheet for Healthcare Providers:  https://www.young.biz/ This test is not yet ap proved or cleared by the [place] FDA and  has been authorized for detection and/or diagnosis of SARS-CoV-2 by FDA under an Emergency Use Authorization (EUA). This EUA will remain  in effect (meaning this test can be used) for the duration of the COVID-19 declaration under Section 564(b)(1) of the Act, 21 U.S.C. section 360bbb-3(b)(1), unless the authorization is terminated or revoked sooner.    Influenza A by PCR NEGATIVE NEGATIVE Final   Influenza B by PCR NEGATIVE NEGATIVE Final    Comment: (NOTE) The Xpert Xpress SARS-CoV-2/FLU/RSV assay is intended as an aid in  the diagnosis of influenza from Nasopharyngeal swab specimens and  should not be used as a sole basis for treatment. Nasal washings and  aspirates are unacceptable for Xpert Xpress SARS-CoV-2/FLU/RSV  testing. Fact Sheet for Patients: https://www.moore.com/ Fact Sheet for Healthcare  Providers: https://www.young.biz/ This test is not yet approved or cleared by the [place] FDA and  has been authorized for detection and/or diagnosis of SARS-CoV-2 by  FDA under an Emergency Use Authorization (EUA). This EUA will remain  in effect (meaning this test can be used) for the duration of the  Covid-19 declaration under Section 564(b)(1) of the Act, 21  U.S.C. section 360bbb-3(b)(1), unless the authorization is  terminated or revoked. Performed at [facility], [address] [place], [place] [address]          Radiology Studies: US ABDOMEN LIMITED RUQ  Result Date: 06/28/2019 CLINICAL DATA:  Pancreatitis.  Evaluate for gallstones EXAM: ULTRASOUND ABDOMEN LIMITED RIGHT UPPER QUADRANT COMPARISON:  CT 03/22/2018 FINDINGS: Gallbladder: No gallstones or wall thickening visualized. No sonographic Murphy sign noted by sonographer. Common bile duct: Diameter: Normal at 4 mm. Liver: No focal lesion identified. Within normal limits in parenchymal echogenicity. Portal vein is patent on color Doppler imaging with normal direction of blood flow towards the liver. Other: None. IMPRESSION: 1. No cholelithiasis or choledocholithiasis. 2. No acute findings by abdominal ultrasound Electronically Signed   By: Genevive Bi M.D.   On: 06/28/2019 05:02        Scheduled Meds: . amLODipine  5 mg Oral Daily  .  cholecalciferol  5,000 Units Oral Daily  . enoxaparin (LOVENOX) injection  40 mg Subcutaneous Q24H  . folic acid  1 mg Per Tube Daily  . metoCLOPramide (REGLAN) injection  10 mg Intravenous Q6H  . omega-3 acid ethyl esters  2 g Oral BID  . pantoprazole (PROTONIX) IV  40 mg Intravenous Q12H  . thiamine  100 mg Oral Daily   Continuous Infusions: . dextrose 5% lactated ringers 75 mL/hr at 06/28/19 0731  . insulin 6 Units/hr (06/28/19 0804)  . lactated ringers 75 mL/hr at 06/28/19 0649     LOS: 0 days    Time spent:  36mins     Wyvonnia Dusky, MD [facility] Pager [phone]  If 7PM-7AM, please contact night-coverage [email or web address] 06/28/2019, 8:11 AM

## 2019-06-28 NOTE — ED Notes (Signed)
Meal tray provided.

## 2019-06-28 NOTE — ED Notes (Signed)
Attempted IV x without success. Alternate RN requested.

## 2019-06-28 NOTE — H&P (Signed)
[facility] NAME: Randy Deleon    MR#:  [id number]  DATE OF BIRTH:  07/01/1965  DATE OF ADMISSION:  06/28/2019  PRIMARY CARE PHYSICIAN: [name], MD   REQUESTING/REFERRING PHYSICIAN: Gonzella Lex, MD CHIEF COMPLAINT:   Chief Complaint  Patient presents with  . Emesis    HISTORY OF PRESENT ILLNESS:  Randy Deleon  is a 54 y.o. African-American male with a known history of type 1 diabetes mellitus and hypertension, who presented to the emergency room with acute onset of intractable nausea and vomiting x12 since yesterday with associated epigastric abdominal pain without diarrhea.  He admitted to chills but did not have a measured fever.  He denied any headache or dizziness or blurred vision.  He has been having diminished urine output due to decreased p.o. intake.  He admits to polydipsia.  No dysuria or hematuria or flank pain.  No cough or wheezing or dyspnea.  No chest pain or palpitations.  Upon presentation to the emergency room, blood pressure was 161/93 with a pulse of 131 respiratory to 25 with otherwise normal vital signs.  Labs revealed venous blood gas with pH 7.19 bicarbonate of 9.5 and CMP was remarkable for blood glucose of 261, potassium 4.6 and chloride 134 with anion gap of 25 and CO2 of 9.  Serum lipase was 351.  African-American EKG showed sinus tachycardia with rate of 123 with possible left atrial enlargement, left axis deviation, LVH with repolarization abnormality and Q waves anteroseptally.  COVID-19 PCR is currently pending.  Right upper quadrant ultrasound revealed no cholelithiasis or choledocholithiasis.  The patient was given 2 L bolus of IV lactated Ringer, 10 equivalent of IV potassium chloride, 4 mg IV Zofran twice, 20 mg IV Pepcid and was started on IV insulin drip.  He will will be admitted to a stepdown unit bed for further evaluation and management.   PAST MEDICAL HISTORY:   Past Medical History:   Diagnosis Date  . Diabetes mellitus without complication ([place])   . Hypertension     PAST SURGICAL HISTORY:   Past Surgical History:  Procedure Laterality Date  . none    He denies any previous surgeries  SOCIAL HISTORY:   Social History   Tobacco Use  . Smoking status: Current Every Day Smoker    Packs/day: 0.75    Years: 20.00    Pack years: 15.00    Types: Cigarettes    Start date: 01/29/1997  . Smokeless tobacco: Never Used  . Tobacco comment: Refer to [facility]  Substance Use Topics  . Alcohol use: Not Currently    FAMILY HISTORY:   Family History  Problem Relation Age of Onset  . Diabetes Mother   . Hypertension Mother   . Congestive Heart Failure Father   . Diabetes Father   . Hypertension Father     DRUG ALLERGIES:   Allergies  Allergen Reactions  . Contrast Media [Iodinated Diagnostic Agents]     Renal failure  . Other Rash and Other (See Comments)    Tested allergic to LIMA BEANS  . Shellfish Allergy Rash and Other (See Comments)    TESTED ALLERGIC TO "SHELLFISH"    REVIEW OF SYSTEMS:   ROS As per history of present illness. All pertinent systems were reviewed above. Constitutional,  HEENT, cardiovascular, respiratory, GI, GU, musculoskeletal, neuro, psychiatric, endocrine,  integumentary and hematologic systems were reviewed and are otherwise  negative/unremarkable except for positive findings mentioned above in the  HPI.   MEDICATIONS AT HOME:   Prior to Admission medications   Medication Sig Start Date End Date Taking? Authorizing Provider  amLODipine (NORVASC) 5 MG tablet Take 1 tablet (5 mg total) by mouth daily. 03/31/18   Mikhail, Velta Addison, DO  blood glucose meter kit and supplies Dispense based on patient and insurance preference. Use up to four times daily as directed. (FOR ICD-10 E10.9, E11.9). 03/30/18   Cristal Ford, DO  Cholecalciferol (VITAMIN D3) 125 MCG (5000 UT) CAPS Take 5,000 Units by mouth daily.    [provider]  feeding supplement, ENSURE ENLIVE, (ENSURE ENLIVE) LIQD Take 237 mLs by mouth 2 (two) times daily between meals. 03/30/18   Mikhail, Velta Addison, DO  folic acid (FOLVITE) 1 MG tablet Place 1 tablet (1 mg total) into feeding tube daily. 03/31/18   Mikhail, Velta Addison, DO  Icosapent Ethyl (VASCEPA) 1 g CAPS Take 2 g by mouth 2 (two) times daily.    [provider]  insulin aspart (NOVOLOG) 100 UNIT/ML FlexPen Use TID with meals and bedtime: CBG 121-150: 3u; CBG 151-200: 4u; CBG 201-250: 7u; CBG 251-300: 11u; CBG 301- 350: 15u; CBG 351-400: 20u 03/30/18   Mikhail, [place], DO  Insulin Glargine (LANTUS) 100 UNIT/ML Solostar Pen Inject 15 Units into the skin daily. 03/30/18   Mikhail, Velta Addison, DO  Insulin Pen Needle 31G X 5 MM MISC Use with insulin pens. 03/30/18   Mikhail, Velta Addison, DO  nicotine (NICODERM CQ - DOSED IN MG/24 HOURS) 21 mg/24hr patch Place 1 patch (21 mg total) onto the skin daily. 03/23/18   Flora Lipps, MD  ondansetron (ZOFRAN) 4 MG tablet Take 1 tablet (4 mg total) by mouth every 6 (six) hours as needed for nausea. 03/22/18   Flora Lipps, MD  thiamine 100 MG tablet Place 1 tablet (100 mg total) into feeding tube daily. 03/31/18   Mikhail, Velta Addison, DO      VITAL SIGNS:  Blood pressure (!) 166/75, pulse (!) 102, temperature 98.4 F (36.9 C), temperature source Oral, resp. rate 16, SpO2 97 %.  PHYSICAL EXAMINATION:  Physical Exam  GENERAL:  54 y.o.-year-old African-American male patient lying in the bed with mild distress from nausea. EYES: Pupils equal, round, reactive to light and accommodation. No scleral icterus. Extraocular muscles intact.  HEENT: Head atraumatic, normocephalic. Oropharynx and nasopharynx clear.  NECK:  Supple, no jugular venous distention. No thyroid enlargement, no tenderness.  LUNGS: Normal breath sounds bilaterally, no wheezing, rales,rhonchi or crepitation. No use of accessory muscles of respiration.  CARDIOVASCULAR: Regular rate and rhythm, S1,  S2 normal. No murmurs, rubs, or gallops.  ABDOMEN: Soft with epigastric and right upper quadrant tenderness without rebound tenderness guarding or rigidity.  Bowel sounds present. No organomegaly or mass.  EXTREMITIES: No pedal edema, cyanosis, or clubbing.  NEUROLOGIC: Cranial nerves II through XII are intact. Muscle strength 5/5 in all extremities. Sensation intact. Gait not checked.  PSYCHIATRIC: The patient is alert and oriented x 3.  Normal affect and good eye contact. SKIN: No obvious rash, lesion, or ulcer.   LABORATORY PANEL:   CBC Recent Labs  Lab 06/28/19 0206  WBC 10.8*  HGB 17.5*  HCT 51.9  PLT 124*   ------------------------------------------------------------------------------------------------------------------  Chemistries  Recent Labs  Lab 06/28/19 0206  NA 134*  K 4.6  CL 100  CO2 9*  GLUCOSE 261*  BUN 11  CREATININE 1.07  CALCIUM 9.6  AST 24  ALT 28  ALKPHOS 66  BILITOT 2.9*   ------------------------------------------------------------------------------------------------------------------  Cardiac Enzymes  No results for input(s): TROPONINI in the last 168 hours. ------------------------------------------------------------------------------------------------------------------  RADIOLOGY:  No results found.    IMPRESSION AND PLAN:   1.  DKA with uncontrolled type 1 diabetes mellitus. -The patient will be admitted to a stepdown unit bed. -We will continue him on IV insulin drip per Endo tool. -We will follow serial BMPs. -He will be aggressively hydrated with IV normal saline. -Invokana, Metformin and Lantus will be held off.  2.  Intractable nausea and vomiting like secondary to early acute pancreatitis likely alcoholic. -The patient will be kept n.p.o. -We will follow serial lipase levels. -He will be placed on scheduled IV Reglan and as needed IV Zofran. -The patient will be placed on IV PPI therapy.  3.  Essential hypertension. -The  patient will be placed on p.o. Norvasc, Coreg as well as as needed IV labetalol.  4.  Tobacco abuse. -I counseled him for smoking cessation and he will receive further counseling here.  5.  Possible alcohol abuse. -Counseled him for cessation of alcohol given his pancreatitis.  6.  DVT prophylaxis. -Subcutaneous Lovenox.  All the records are reviewed and case discussed with ED provider. The plan of care was discussed in details with the patient (and family). I answered all questions. The patient agreed to proceed with the above mentioned plan. Further management will depend upon hospital course.    CODE STATUS: Full code  Status is: Inpatient  Remains inpatient appropriate because:Inpatient level of care appropriate due to severity of illness   Dispo: The patient is from: Home              Anticipated d/c is to: Home              Anticipated d/c date is: 2 days              Patient currently is not medically stable to d/c.   TOTAL TIME TAKING CARE OF THIS PATIENT: 55 minutes.    Christel Mormon M.D on 06/28/2019 at 4:53 AM  [facility]   From 7 PM-7 AM, contact night-coverage [email or web address]  CC: Primary care physician; [name], MD   Note: This dictation was prepared with Dragon dictation along with smaller phrase technology. Any transcriptional errors that result from this process are unintentional.

## 2019-06-28 NOTE — ED Notes (Signed)
Pt vomiting att

## 2019-06-28 NOTE — ED Notes (Signed)
Insulin d/c per Mayford Knife MD.

## 2019-06-29 DIAGNOSIS — E1165 Type 2 diabetes mellitus with hyperglycemia: Secondary | ICD-10-CM

## 2019-06-29 LAB — BASIC METABOLIC PANEL
Anion gap: 14 (ref 5–15)
BUN: 8 mg/dL (ref 6–20)
CO2: 19 mmol/L — ABNORMAL LOW (ref 22–32)
Calcium: 9.3 mg/dL (ref 8.9–10.3)
Chloride: 102 mmol/L (ref 98–111)
Creatinine, Ser: 0.87 mg/dL (ref 0.61–1.24)
GFR calc Af Amer: >60 mL/min (ref >60)
GFR calc non Af Amer: >60 mL/min (ref >60)
Glucose, Bld: 235 mg/dL — ABNORMAL HIGH (ref 70–99)
Potassium: 4.1 mmol/L (ref 3.5–5.1)
Sodium: 135 mmol/L (ref 135–145)

## 2019-06-29 LAB — GLUCOSE, CAPILLARY
Glucose-Capillary: 229 mg/dL — ABNORMAL HIGH (ref 70–99)
Glucose-Capillary: 293 mg/dL — ABNORMAL HIGH (ref 70–99)

## 2019-06-29 LAB — CBC
HCT: 41.8 % (ref 39.0–52.0)
Hemoglobin: 14.4 g/dL (ref 13.0–17.0)
MCH: 33.5 pg (ref 26.0–34.0)
MCHC: 34.4 g/dL (ref 30.0–36.0)
MCV: 97.2 fL (ref 80.0–100.0)
Platelets: 93 10*3/uL — ABNORMAL LOW (ref 150–400)
RBC: 4.3 MIL/uL (ref 4.22–5.81)
RDW: 12.2 % (ref 11.5–15.5)
WBC: 9.9 10*3/uL (ref 4.0–10.5)
nRBC: 0 % (ref 0.0–0.2)

## 2019-06-29 NOTE — Plan of Care (Signed)
IV removed. Education performed. The patient has been discharged in a wheelchair.  Problem: Education: Goal: Knowledge of General Education information will improve Description: Including pain rating scale, medication(s)/side effects and non-pharmacologic comfort measures Outcome: Completed/Met   Problem: Health Behavior/Discharge Planning: Goal: Ability to manage health-related needs will improve Outcome: Completed/Met   Problem: Clinical Measurements: Goal: Ability to maintain clinical measurements within normal limits will improve Outcome: Completed/Met Goal: Will remain free from infection Outcome: Completed/Met Goal: Diagnostic test results will improve Outcome: Completed/Met Goal: Respiratory complications will improve Outcome: Completed/Met Goal: Cardiovascular complication will be avoided Outcome: Completed/Met   Problem: Activity: Goal: Risk for activity intolerance will decrease Outcome: Completed/Met   Problem: Nutrition: Goal: Adequate nutrition will be maintained Outcome: Completed/Met   Problem: Coping: Goal: Level of anxiety will decrease Outcome: Completed/Met   Problem: Elimination: Goal: Will not experience complications related to bowel motility Outcome: Completed/Met Goal: Will not experience complications related to urinary retention Outcome: Completed/Met   Problem: Pain Managment: Goal: General experience of comfort will improve Outcome: Completed/Met   Problem: Pain Managment: Goal: General experience of comfort will improve Outcome: Completed/Met   Problem: Safety: Goal: Ability to remain free from injury will improve Outcome: Completed/Met   Problem: Skin Integrity: Goal: Risk for impaired skin integrity will decrease Outcome: Completed/Met

## 2019-06-29 NOTE — Progress Notes (Signed)
Inpatient Diabetes Program Recommendations  AACE/ADA: New Consensus Statement on Inpatient Glycemic Control (2015)  Target Ranges:  Prepandial:   less than 140 mg/dL      Peak postprandial:   less than 180 mg/dL (1-2 hours)      Critically ill patients:  140 - 180 mg/dL     Review of Glycemic Control Results for Randy Deleon, SPIEKER (MRN [id number]) as of 06/29/2019 09:49  Ref. Range 06/28/2019 16:53 06/28/2019 18:37 06/28/2019 20:31 06/28/2019 21:44 06/29/2019 07:31  Glucose-Capillary Latest Ref Range: 70 - 99 mg/dL 254 (H) 270 (H) 623 (H) 214 (H) 229 (H)   Diabetes history: DM 2 Outpatient Diabetes medications: Lantus 15 units Daily, novolog 3-20 units tid, Invokana per H&P (SGLT2) Current orders for Inpatient glycemic control:  IV insulin/Endotool DKA  Inpatient Diabetes Program Recommendations:    Glucose 200's increase Lantus to home dose 15 units.  Noted pt's on SGLT2 inhibitors at high risk of DKA in the setting of dehydration. Glucose 261 on admission CO2 10. IV insulin for now. Would not restart Invokana at time of d/c and have pt follow up with MD who prescribed it.  DKA will take longer to clear on these meds and sometimes takes more dextrose in fluids.  Will follow.  Thanks,  Christena Deem RN, MSN, BC-ADM Inpatient Diabetes Coordinator Team Pager [phone] (8a-5p)

## 2019-06-29 NOTE — Discharge Summary (Signed)
Physician Discharge Summary  Randy Deleon VQM:[id number] DOB: 27-Mar-1965 DOA: 06/28/2019  PCP: Sherron Monday, MD  Admit date: 06/28/2019 Discharge date: 06/29/2019  Admitted From: home Disposition: home  Recommendations for Outpatient Follow-up:  1. Follow up with PCP in 1 week  Home Health:no Equipment/Devices:  Discharge Condition: stable  CODE STATUS: full  Diet recommendation: carb modified  Brief/Interim Summary: HPI was taken from Dr. Arville Care: Randy Deleon  is a 54 y.o. African-American male with a known history of type 1 diabetes mellitus and hypertension, who presented to the emergency room with acute onset of intractable nausea and vomiting x12 since yesterday with associated epigastric abdominal pain without diarrhea.  He admitted to chills but did not have a measured fever.  He denied any headache or dizziness or blurred vision.  He has been having diminished urine output due to decreased p.o. intake.  He admits to polydipsia.  No dysuria or hematuria or flank pain.  No cough or wheezing or dyspnea.  No chest pain or palpitations.  Upon presentation to the emergency room, blood pressure was 161/93 with a pulse of 131 respiratory to 25 with otherwise normal vital signs.  Labs revealed venous blood gas with pH 7.19 bicarbonate of 9.5 and CMP was remarkable for blood glucose of 261, potassium 4.6 and chloride 134 with anion gap of 25 and CO2 of 9.  Serum lipase was 351.  African-American EKG showed sinus tachycardia with rate of 123 with possible left atrial enlargement, left axis deviation, LVH with repolarization abnormality and Q waves anteroseptally.  COVID-19 PCR is currently pending.  Right upper quadrant ultrasound revealed no cholelithiasis or choledocholithiasis.  The patient was given 2 L bolus of IV lactated Ringer, 10 equivalent of IV potassium chloride, 4 mg IV Zofran twice, 20 mg IV Pepcid and was started on IV insulin drip.  He will will be admitted to a stepdown  unit bed for further evaluation and management.  Hospital Course from Dr. Wilfred Lacy 4/10-4/11/21: Pt was admitted for DKA secondary to uncontrolled DM2. Pt was initially placed on insulin drip to close the anion gap and then was transitioned back to sq insulin. Pt had nausea/vomiting thought to be secondary to early pancreatitis that resolved w/ zofran & reglan. Pt did receive DM education while inpatient. Pt verbalized his understanding.   Discharge Diagnoses:  Active Problems:   DKA (diabetic ketoacidoses) (HCC)  DKA: w/ uncontrolled DM2. D/C insulin drip. Continue on lantus & SSI w/ accuchecks. Continue on IVFs. DM coordinator consulted   Intractable nausea and vomiting: like secondary to early acute pancreatitis likely alcoholic. Zofran, reglan prn for nausea/vomiting. Lipase is elevated. Will continue scopolamine patch.   Essential hypertension: continue on amlodipine, coreg. IV labetalol prn.  Tobacco abuse: smoking cessation counseling   Possible alcohol abuse: alcohol cessation counseling   Leukocytosis: likely reactive. Will continue to monitor  Thrombocytopenia: etiology unclear, possibly secondary to alcohol abuse. Will continue to monitor     Discharge Instructions  Discharge Instructions    Diet - low sodium heart healthy   Complete by: As directed    Diet Carb Modified   Complete by: As directed    Discharge instructions   Complete by: As directed    F/u PCP in 1 week   Increase activity slowly   Complete by: As directed      Allergies as of 06/29/2019      Reactions   Contrast Media [iodinated Diagnostic Agents]    Renal failure   Other  Rash, Other (See Comments)   Tested allergic to LIMA BEANS   Shellfish Allergy Rash, Other (See Comments)   TESTED ALLERGIC TO "SHELLFISH"      Medication List    STOP taking these medications   amLODipine 5 MG tablet Commonly known as: NORVASC     TAKE these medications   amLODipine-benazepril 10-20 MG  capsule Commonly known as: LOTREL Take 1 capsule by mouth every morning.   carvedilol 25 MG tablet Commonly known as: COREG Take 25 mg by mouth 2 (two) times daily.   fenofibrate 160 MG tablet Take 160 mg by mouth every morning.   gabapentin 300 MG capsule Commonly known as: NEURONTIN Take 300 mg by mouth 3 (three) times daily.   insulin aspart 100 UNIT/ML FlexPen Commonly known as: NOVOLOG Use TID with meals and bedtime: CBG 121-150: 3u; CBG 151-200: 4u; CBG 201-250: 7u; CBG 251-300: 11u; CBG 301- 350: 15u; CBG 351-400: 20u   Invokana 300 MG Tabs tablet Generic drug: canagliflozin Take 300 mg by mouth every morning.   metFORMIN 1000 MG tablet Commonly known as: GLUCOPHAGE Take 1,000 mg by mouth 2 (two) times daily.   sildenafil 100 MG tablet Commonly known as: VIAGRA Take 100 mg by mouth daily as needed.   Toujeo Max SoloStar 300 UNIT/ML Solostar Pen Generic drug: insulin glargine (2 Unit Dial) Inject 100 Units into the skin.   Vascepa 1 g capsule Generic drug: icosapent Ethyl Take 2 g by mouth 2 (two) times daily.   Vitamin D3 125 MCG (5000 UT) Caps Take 5,000 Units by mouth daily.       Allergies  Allergen Reactions  . Contrast Media [Iodinated Diagnostic Agents]     Renal failure  . Other Rash and Other (See Comments)    Tested allergic to LIMA BEANS  . Shellfish Allergy Rash and Other (See Comments)    TESTED ALLERGIC TO "SHELLFISH"    Consultations:  none   Procedures/Studies: US ABDOMEN LIMITED RUQ  Result Date: 06/28/2019 CLINICAL DATA:  Pancreatitis.  Evaluate for gallstones EXAM: ULTRASOUND ABDOMEN LIMITED RIGHT UPPER QUADRANT COMPARISON:  CT 03/22/2018 FINDINGS: Gallbladder: No gallstones or wall thickening visualized. No sonographic Murphy sign noted by sonographer. Common bile duct: Diameter: Normal at 4 mm. Liver: No focal lesion identified. Within normal limits in parenchymal echogenicity. Portal vein is patent on color Doppler imaging  with normal direction of blood flow towards the liver. Other: None. IMPRESSION: 1. No cholelithiasis or choledocholithiasis. 2. No acute findings by abdominal ultrasound Electronically Signed   By: Genevive Bi M.D.   On: 06/28/2019 05:02       Subjective: Pt c/o malaise   Discharge Exam: Vitals:   06/29/19 1203 06/29/19 1205  BP:    Pulse: (!) 112 (!) 108  Resp:    Temp:    SpO2:     Vitals:   06/29/19 0829 06/29/19 1159 06/29/19 1203 06/29/19 1205  BP: 117/70 134/78    Pulse: (!) 106 (!) 114 (!) 112 (!) 108  Resp: 19     Temp: 98.3 F (36.8 C) 98.5 F (36.9 C)    TempSrc: Oral Oral    SpO2: 95% 97%    Weight:      Height:        General: Pt is alert, awake, not in acute distress Cardiovascular: S1/S2 +, no rubs, no gallops Respiratory: CTA bilaterally, no wheezing, no rales Abdominal: Soft, NT, ND, bowel sounds + Extremities: no edema, no cyanosis    The results  of significant diagnostics from this hospitalization (including imaging, microbiology, ancillary and laboratory) are listed below for reference.     Microbiology: Recent Results (from the past 240 hour(s))  Respiratory Panel by RT PCR (Flu A&B, Covid) - Nasopharyngeal Deleon     Status: None   Collection Time: 06/28/19  4:37 AM   Specimen: Nasopharyngeal Deleon  Result Value Ref Range Status   SARS Coronavirus 2 by RT PCR NEGATIVE NEGATIVE Final    Comment: (NOTE) SARS-CoV-2 target nucleic acids are NOT DETECTED. The SARS-CoV-2 RNA is generally detectable in upper respiratoy specimens during the acute phase of infection. The lowest concentration of SARS-CoV-2 viral copies this assay can detect is 131 copies/mL. A negative result does not preclude SARS-Cov-2 infection and should not be used as the sole basis for treatment or other patient management decisions. A negative result may occur with  improper specimen collection/handling, submission of specimen other than nasopharyngeal Deleon, presence of  viral mutation(s) within the areas targeted by this assay, and inadequate number of viral copies (<131 copies/mL). A negative result must be combined with clinical observations, patient history, and epidemiological information. The expected result is Negative. Fact Sheet for Patients:  PinkCheek.be Fact Sheet for Healthcare Providers:  GravelBags.it This test is not yet ap proved or cleared by the [place] FDA and  has been authorized for detection and/or diagnosis of SARS-CoV-2 by FDA under an Emergency Use Authorization (EUA). This EUA will remain  in effect (meaning this test can be used) for the duration of the COVID-19 declaration under Section 564(b)(1) of the Act, 21 U.S.C. section 360bbb-3(b)(1), unless the authorization is terminated or revoked sooner.    Influenza A by PCR NEGATIVE NEGATIVE Final   Influenza B by PCR NEGATIVE NEGATIVE Final    Comment: (NOTE) The Xpert Xpress SARS-CoV-2/FLU/RSV assay is intended as an aid in  the diagnosis of influenza from Nasopharyngeal Deleon specimens and  should not be used as a sole basis for treatment. Nasal washings and  aspirates are unacceptable for Xpert Xpress SARS-CoV-2/FLU/RSV  testing. Fact Sheet for Patients: PinkCheek.be Fact Sheet for Healthcare Providers: GravelBags.it This test is not yet approved or cleared by the [place] FDA and  has been authorized for detection and/or diagnosis of SARS-CoV-2 by  FDA under an Emergency Use Authorization (EUA). This EUA will remain  in effect (meaning this test can be used) for the duration of the  Covid-19 declaration under Section 564(b)(1) of the Act, 21  U.S.C. section 360bbb-3(b)(1), unless the authorization is  terminated or revoked. Performed at [facility], [address] [place], [place] [address]      Labs: BNP (last 3 results) No  results for input(s): BNP in the last 8760 hours. Basic Metabolic Panel: Recent Labs  Lab 06/28/19 0206 06/28/19 0627 06/28/19 1308 06/28/19 2159 06/29/19 0613  NA 134* 133* 134* 134* 135  K 4.6 4.3 4.1 4.0 4.1  CL 100 103 103 102 102  CO2 9* 10* 19* 17* 19*  GLUCOSE 261* 213* 191* 234* 235*  BUN 11 10 9 8 8   CREATININE 1.07 1.04 0.95 0.86 0.87  CALCIUM 9.6 9.0 9.3 9.4 9.3   Liver Function Tests: Recent Labs  Lab 06/28/19 0206  AST 24  ALT 28  ALKPHOS 66  BILITOT 2.9*  PROT 8.5*  ALBUMIN 4.8   Recent Labs  Lab 06/28/19 0206  LIPASE 351*   No results for input(s): AMMONIA in the last 168 hours. CBC: Recent Labs  Lab 06/28/19  0206 06/29/19 0613  WBC 10.8* 9.9  HGB 17.5* 14.4  HCT 51.9 41.8  MCV 100.2* 97.2  PLT 124* 93*   Cardiac Enzymes: No results for input(s): CKTOTAL, CKMB, CKMBINDEX, TROPONINI in the last 168 hours. BNP: Invalid input(s): POCBNP CBG: Recent Labs  Lab 06/28/19 1653 06/28/19 1837 06/28/19 2031 06/28/19 2144 06/29/19 0731  GLUCAP 192* 219* 241* 214* 229*   D-Dimer No results for input(s): DDIMER in the last 72 hours. Hgb A1c No results for input(s): HGBA1C in the last 72 hours. Lipid Profile No results for input(s): CHOL, HDL, LDLCALC, TRIG, CHOLHDL, LDLDIRECT in the last 72 hours. Thyroid function studies No results for input(s): TSH, T4TOTAL, T3FREE, THYROIDAB in the last 72 hours.  Invalid input(s): FREET3 Anemia work up No results for input(s): VITAMINB12, FOLATE, FERRITIN, TIBC, IRON, RETICCTPCT in the last 72 hours. Urinalysis    Component Value Date/Time   COLORURINE STRAW (A) 06/28/2019 0430   APPEARANCEUR CLEAR (A) 06/28/2019 0430   LABSPEC 1.022 06/28/2019 0430   PHURINE 5.0 06/28/2019 0430   GLUCOSEU >=500 (A) 06/28/2019 0430   HGBUR MODERATE (A) 06/28/2019 0430   BILIRUBINUR NEGATIVE 06/28/2019 0430   KETONESUR 80 (A) 06/28/2019 0430   PROTEINUR 100 (A) 06/28/2019 0430   NITRITE NEGATIVE 06/28/2019 0430    LEUKOCYTESUR NEGATIVE 06/28/2019 0430   Sepsis Labs Invalid input(s): PROCALCITONIN,  WBC,  LACTICIDVEN Microbiology Recent Results (from the past 240 hour(s))  Respiratory Panel by RT PCR (Flu A&B, Covid) - Nasopharyngeal Deleon     Status: None   Collection Time: 06/28/19  4:37 AM   Specimen: Nasopharyngeal Deleon  Result Value Ref Range Status   SARS Coronavirus 2 by RT PCR NEGATIVE NEGATIVE Final    Comment: (NOTE) SARS-CoV-2 target nucleic acids are NOT DETECTED. The SARS-CoV-2 RNA is generally detectable in upper respiratoy specimens during the acute phase of infection. The lowest concentration of SARS-CoV-2 viral copies this assay can detect is 131 copies/mL. A negative result does not preclude SARS-Cov-2 infection and should not be used as the sole basis for treatment or other patient management decisions. A negative result may occur with  improper specimen collection/handling, submission of specimen other than nasopharyngeal Deleon, presence of viral mutation(s) within the areas targeted by this assay, and inadequate number of viral copies (<131 copies/mL). A negative result must be combined with clinical observations, patient history, and epidemiological information. The expected result is Negative. Fact Sheet for Patients:  https://www.moore.com/https://www.fda.gov/media/142436/download Fact Sheet for Healthcare Providers:  https://www.young.biz/https://www.fda.gov/media/142435/download This test is not yet ap proved or cleared by the [place] FDA and  has been authorized for detection and/or diagnosis of SARS-CoV-2 by FDA under an Emergency Use Authorization (EUA). This EUA will remain  in effect (meaning this test can be used) for the duration of the COVID-19 declaration under Section 564(b)(1) of the Act, 21 U.S.C. section 360bbb-3(b)(1), unless the authorization is terminated or revoked sooner.    Influenza A by PCR NEGATIVE NEGATIVE Final   Influenza B by PCR NEGATIVE NEGATIVE Final    Comment:  (NOTE) The Xpert Xpress SARS-CoV-2/FLU/RSV assay is intended as an aid in  the diagnosis of influenza from Nasopharyngeal Deleon specimens and  should not be used as a sole basis for treatment. Nasal washings and  aspirates are unacceptable for Xpert Xpress SARS-CoV-2/FLU/RSV  testing. Fact Sheet for Patients: https://www.moore.com/https://www.fda.gov/media/142436/download Fact Sheet for Healthcare Providers: https://www.young.biz/https://www.fda.gov/media/142435/download This test is not yet approved or cleared by the [place] FDA and  has been authorized for detection and/or  diagnosis of SARS-CoV-2 by  FDA under an Emergency Use Authorization (EUA). This EUA will remain  in effect (meaning this test can be used) for the duration of the  Covid-19 declaration under Section 564(b)(1) of the Act, 21  U.S.C. section 360bbb-3(b)(1), unless the authorization is  terminated or revoked. Performed at [facility], [address] [place], [place] [address]      Time coordinating discharge: Over 30 minutes  SIGNED:   Charise Killian, MD  [facility] 06/29/2019, 12:21 PM Pager   If 7PM-7AM, please contact night-coverage [email or web address]

## 2019-07-01 LAB — HEMOGLOBIN A1C
Hgb A1c MFr Bld: 9.2 % — ABNORMAL HIGH (ref 4.8–5.6)
Mean Plasma Glucose: 217 mg/dL

## 2019-12-06 IMAGING — DX DG CHEST 1V PORT
1 series · 1 of 1 positions shown · non-contrast
Comparison: None.

CLINICAL DATA: Status post intubation

EXAM:
PORTABLE CHEST 1 VIEW

[chest ap]
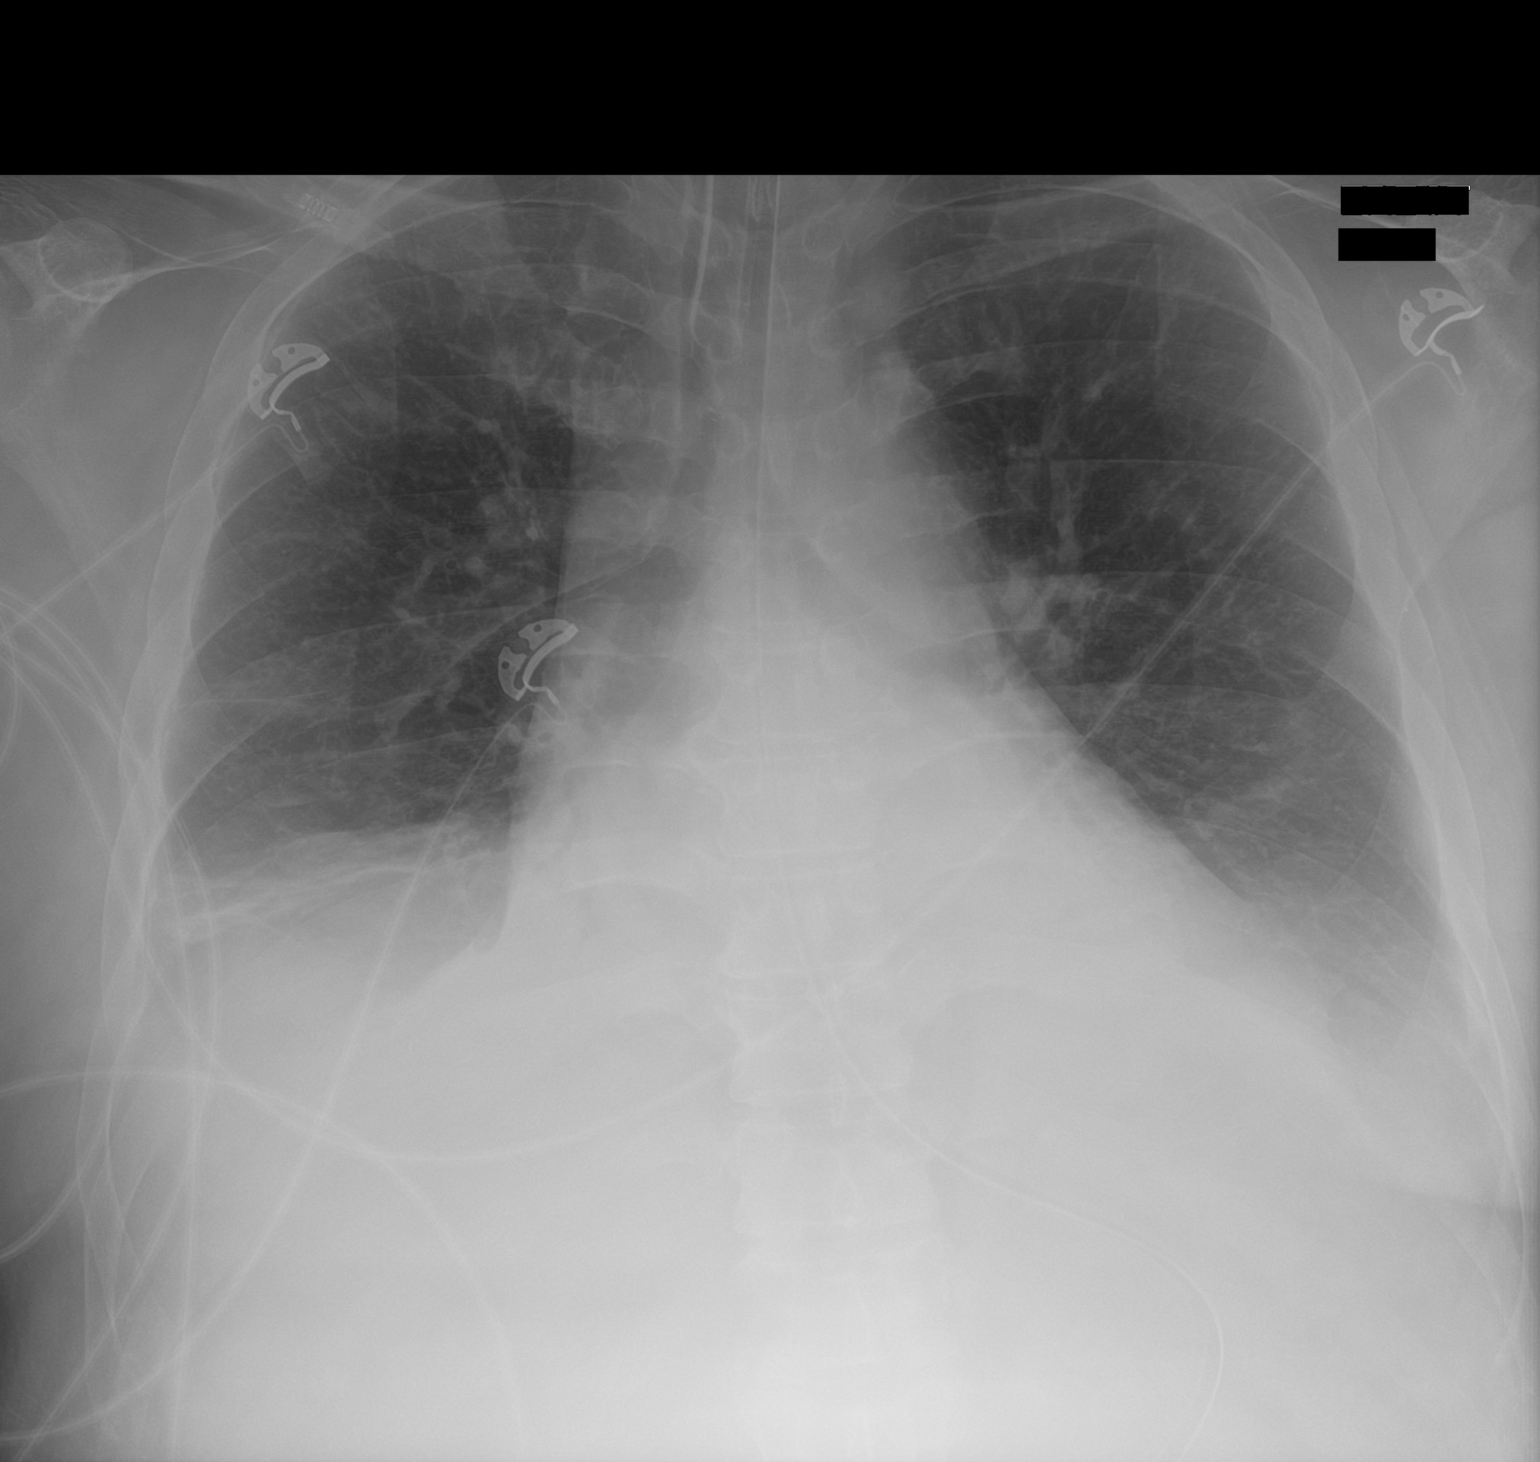

[1 of 1 positions shown; findings below may reference images not displayed]

FINDINGS: Endotracheal tube tip is about 3.3 cm superior to carina. Esophageal
tube tip is below diaphragm but non included in the field of view.
Small bilateral pleural effusions and bibasilar dense airspace
disease. Mild cardiomegaly. No pneumothorax.
IMPRESSION: 1. Endotracheal tube tip about 3.3 cm superior to carina
2. Interval development of small bilateral pleural effusions and
dense bibasilar atelectasis or pneumonia

## 2019-12-07 IMAGING — DX DG CHEST 1V PORT
1 series · 1 of 1 positions shown · non-contrast
Comparison: 03/22/2018 at 9187 hours

CLINICAL DATA: Central line placement

EXAM:
PORTABLE CHEST 1 VIEW

[chest ap]
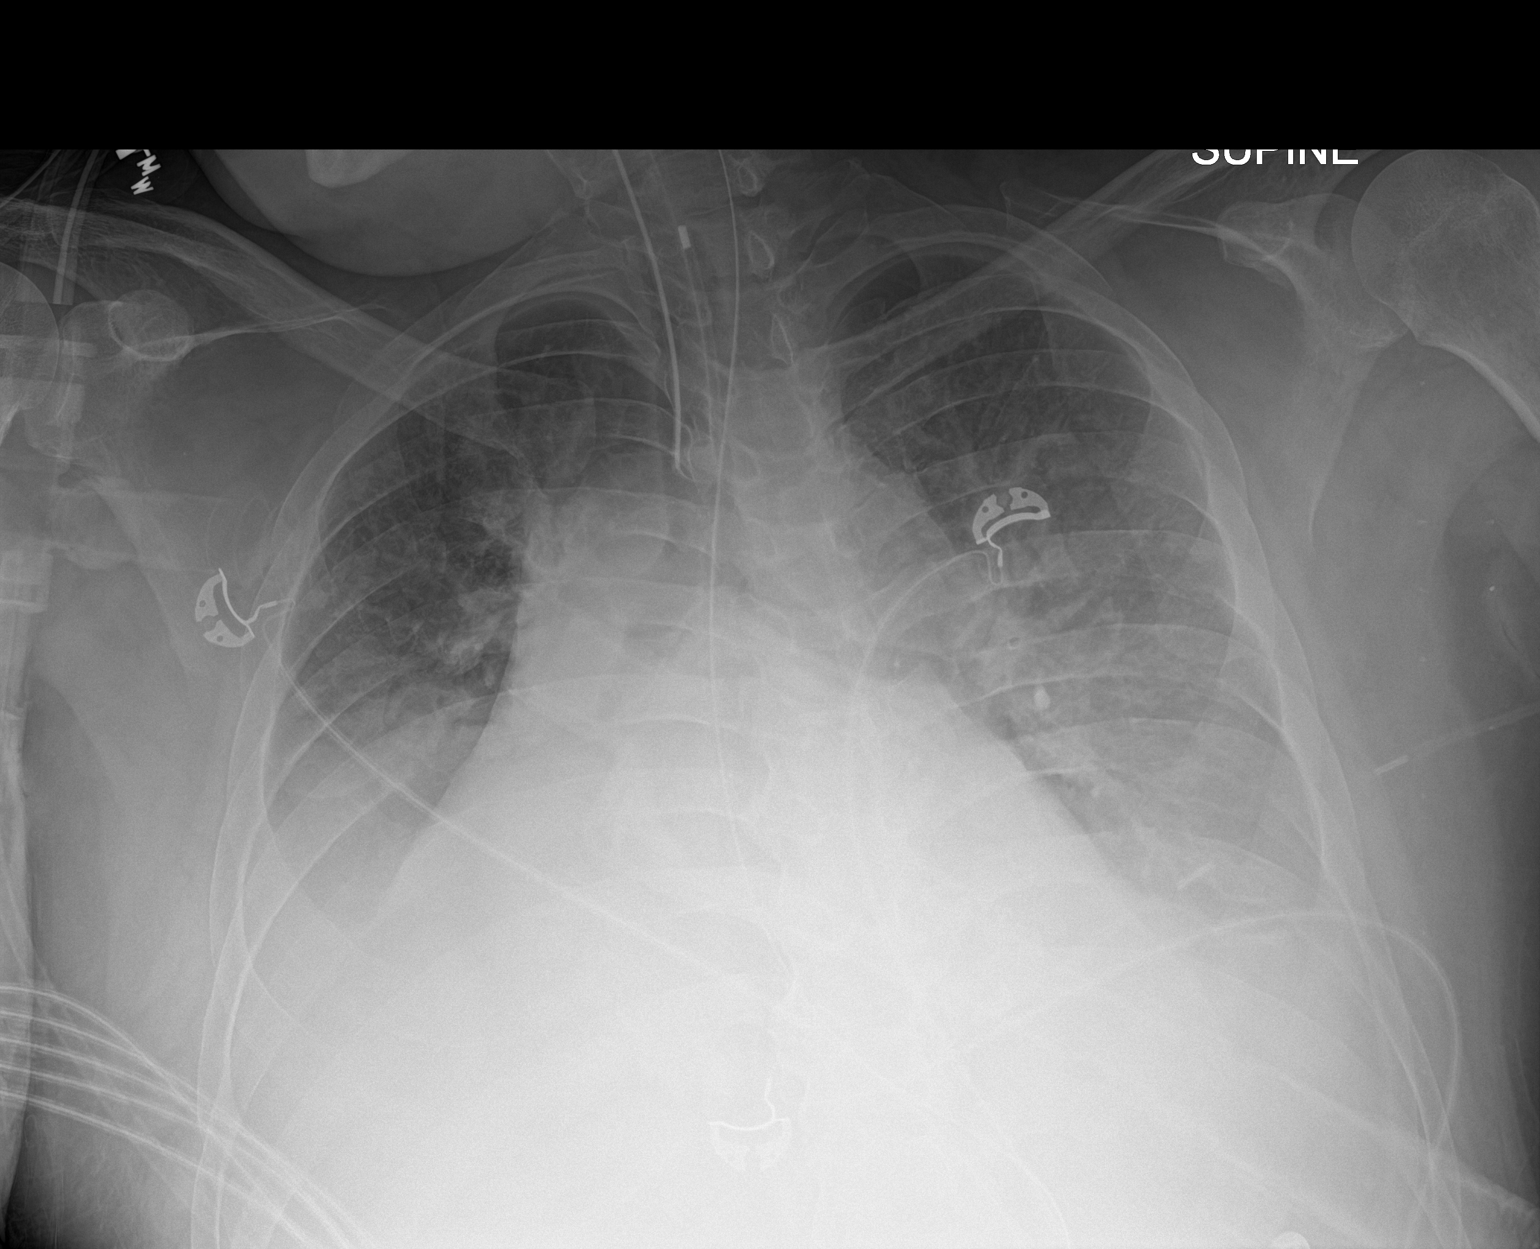

[1 of 1 positions shown; findings below may reference images not displayed]

FINDINGS: Endotracheal tube terminates 2.8 cm above the carina.

Enteric tube courses into the stomach.

Moderate right and small left pleural effusions. Associated left
lower lobe opacity, likely atelectasis. Mild frank interstitial
edema. No pneumothorax.

Cardiomegaly.
IMPRESSION: Endotracheal tube terminates 2.8 cm above the carina. Enteric tube
courses into the stomach.

Cardiomegaly with mild interstitial edema. Moderate right and small
left pleural effusions.

## 2019-12-07 IMAGING — DX DG CHEST 1V PORT
2 series · 2 of 2 positions shown · non-contrast
Comparison: 03/21/2018.

CLINICAL DATA: Respiratory failure.

EXAM:
PORTABLE CHEST 1 VIEW

[chest ap (1 of 2)]
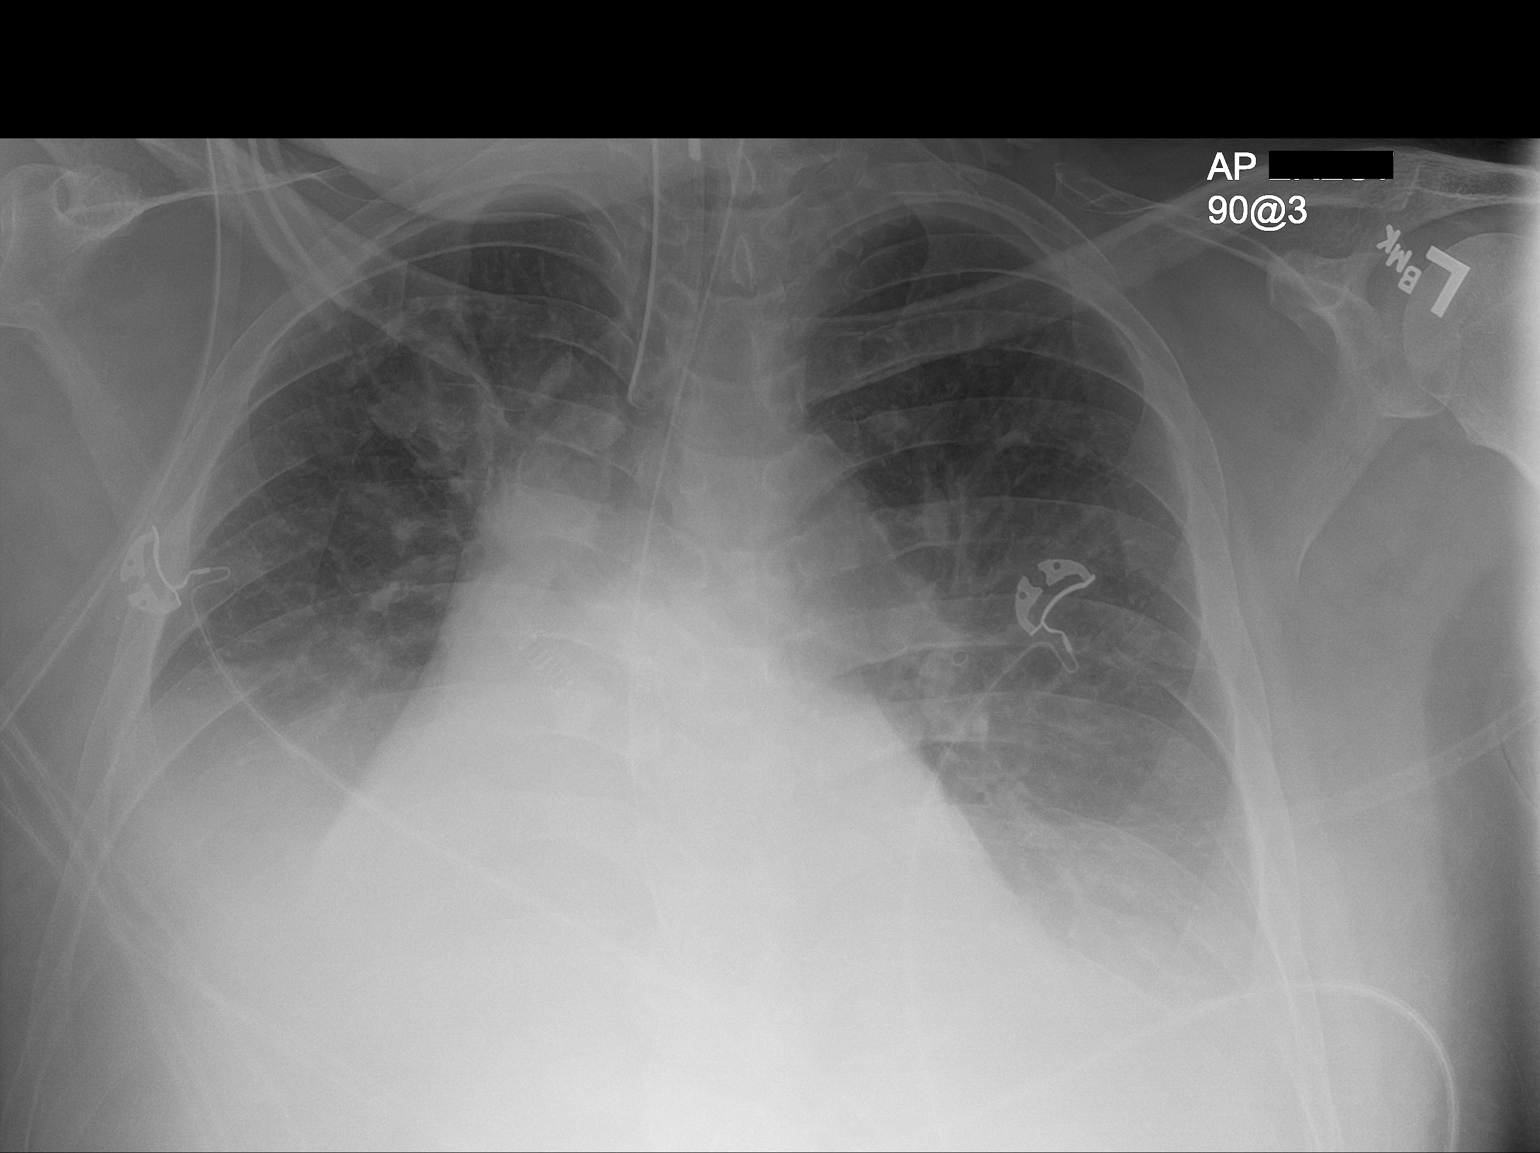

[chest ap (2 of 2)]
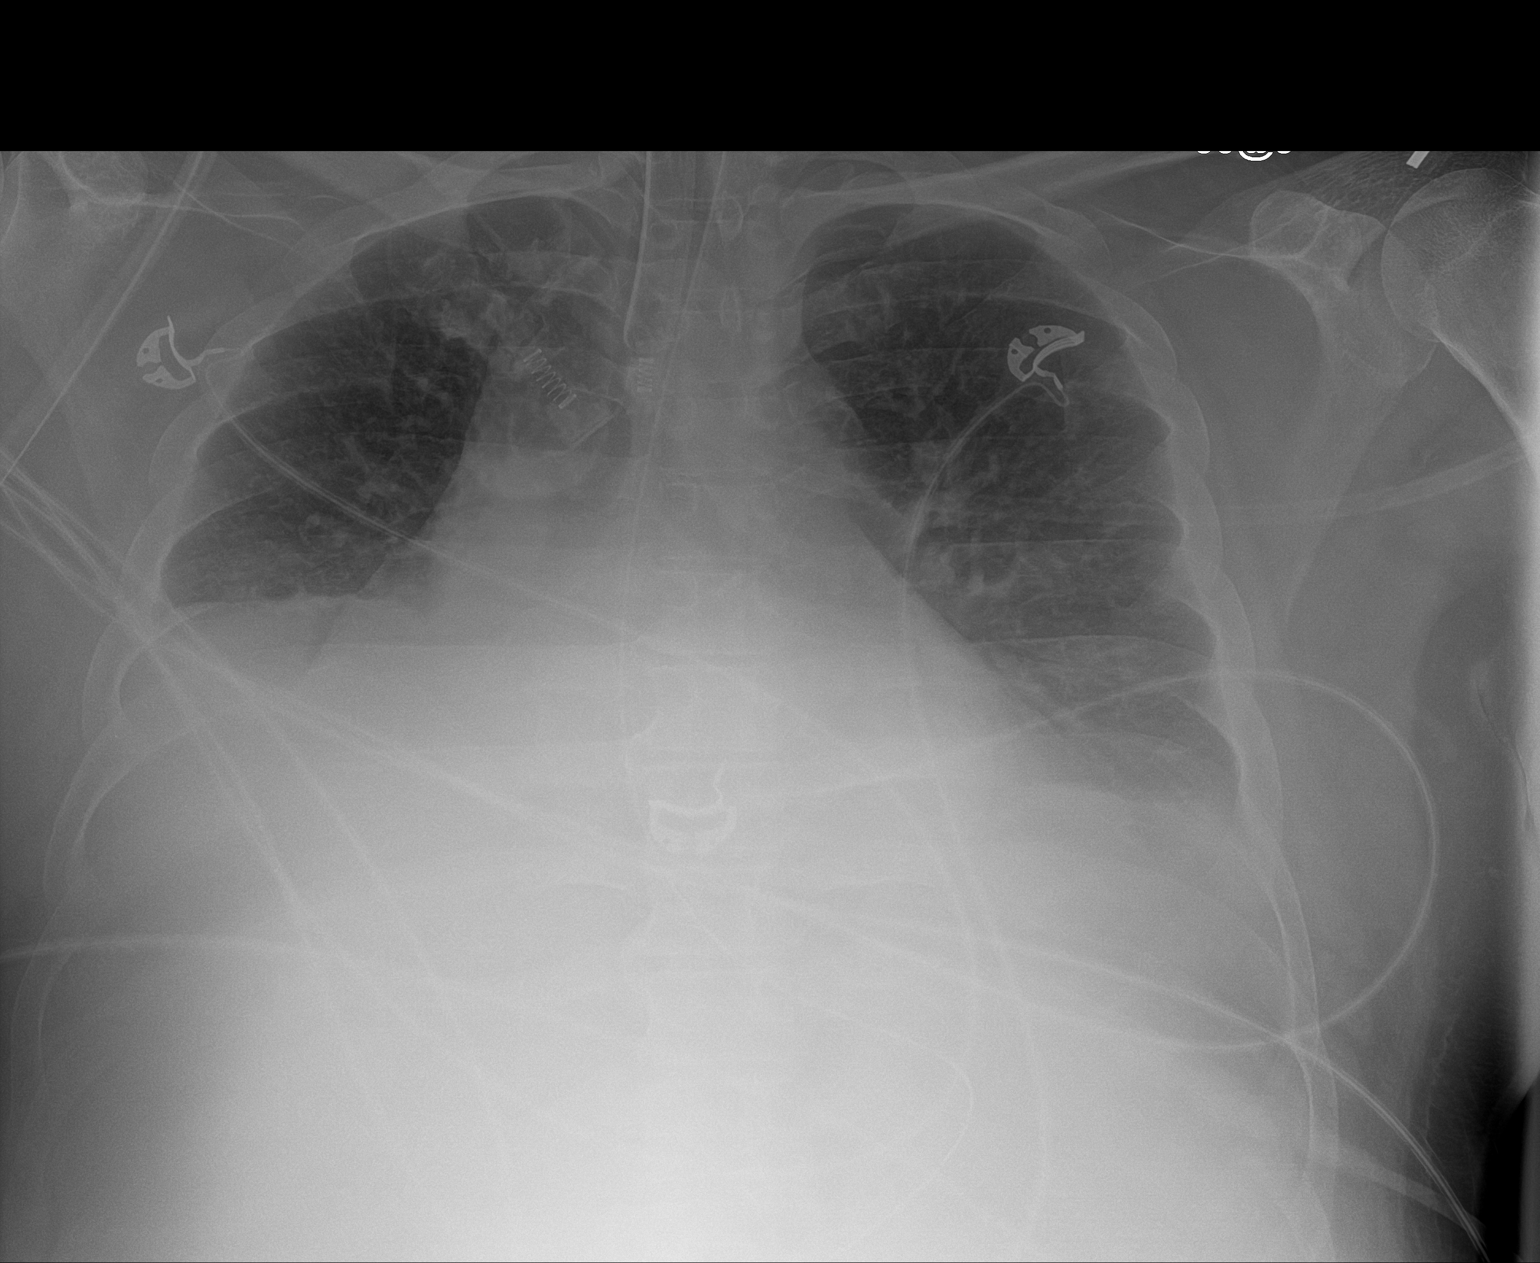

[2 of 2 positions shown; findings below may reference images not displayed]

FINDINGS: Endotracheal tube in stable position. NG tube below left
hemidiaphragm. Cardiomegaly. Bilateral pulmonary infiltrates and
bilateral pleural effusions progressed from prior exam. CHF and
bibasilar pneumonia could present this fashion. No pneumothorax.
IMPRESSION: 1. Endotracheal tube stable position. NG tube below left
hemidiaphragm.

2. Cardiomegaly with bibasilar infiltrates and bilateral pleural
effusions, progressed from prior exam. CHF could present this
fashion. Bibasilar pneumonia can not be excluded.

## 2019-12-08 IMAGING — DX DG CHEST 1V PORT
1 series · 1 of 1 positions shown · non-contrast
Comparison: Prior chest x-ray 03/22/2018

CLINICAL DATA: 52-year-old male with respiratory failure requiring
intubation.

EXAM:
PORTABLE CHEST 1 VIEW

[chest]
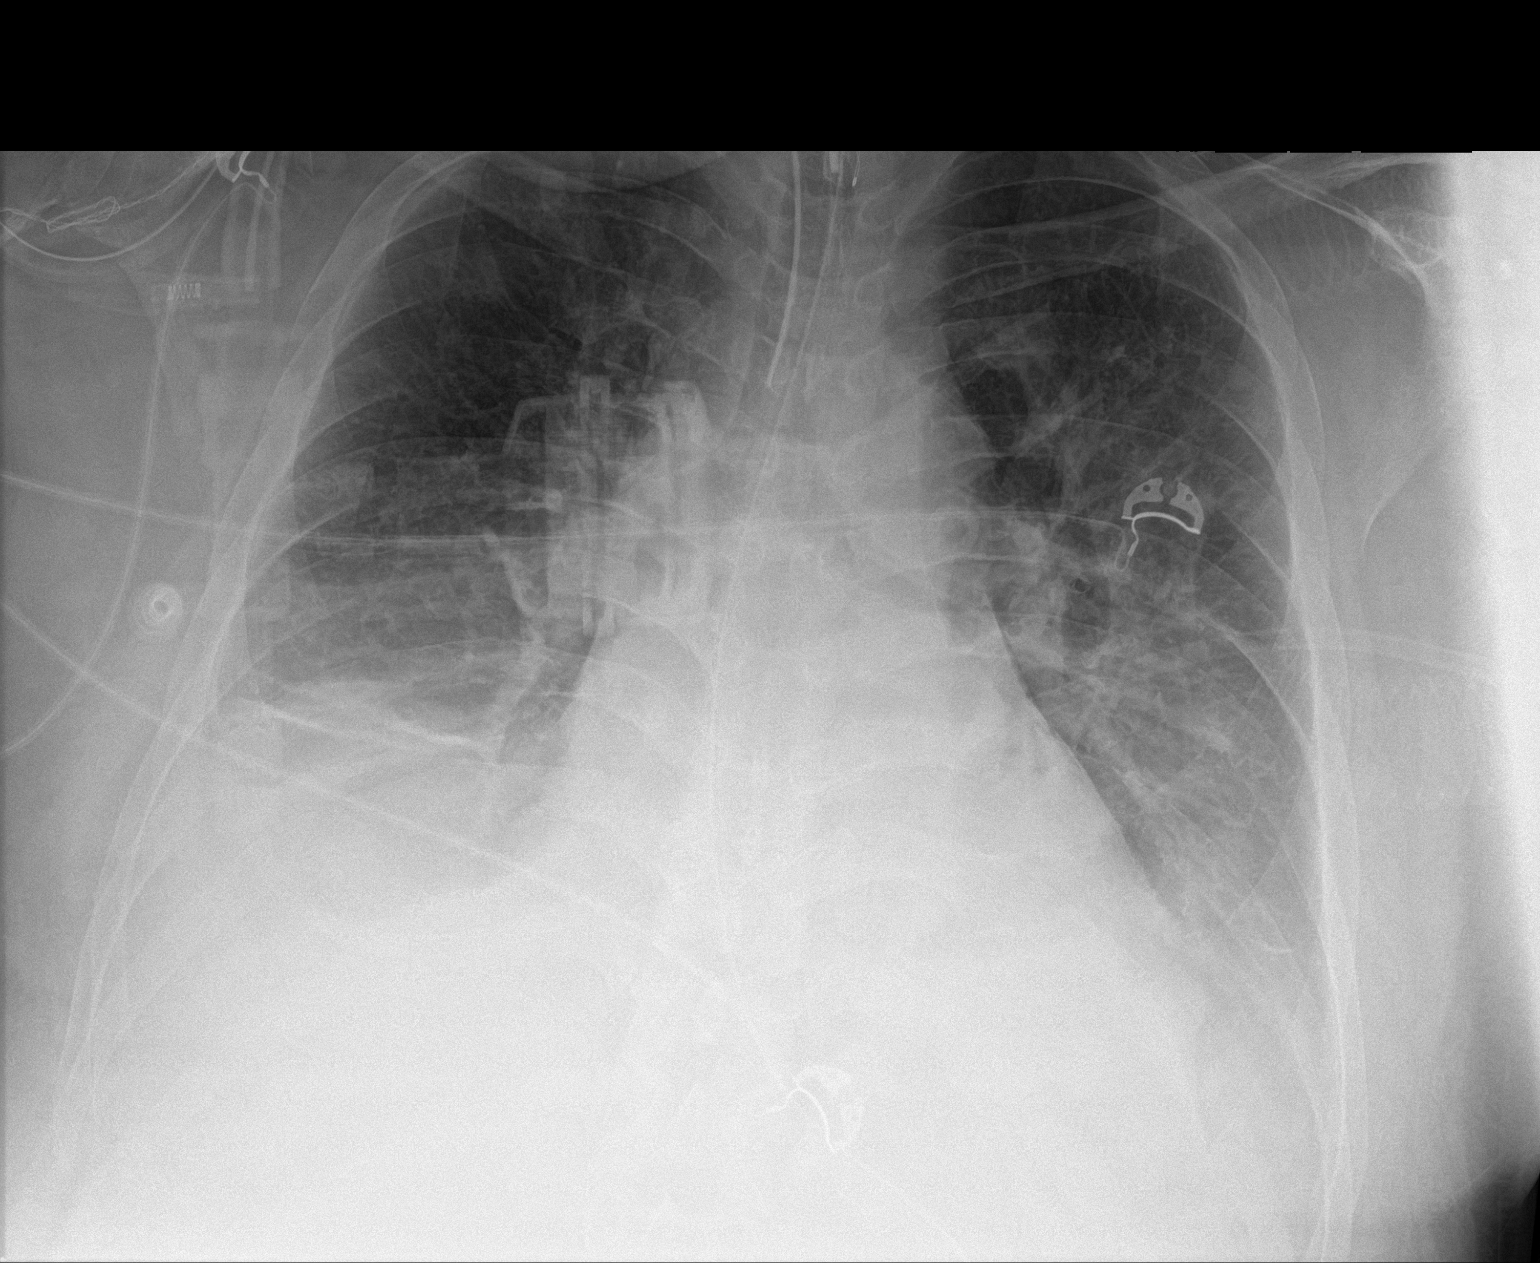

[1 of 1 positions shown; findings below may reference images not displayed]

FINDINGS: The tip of the endotracheal tube is 2.8 cm above the carina. A
gastric tube is present. The tip lies inferior to the diaphragm and
below the field of view, presumably within the stomach. Cardiac and
mediastinal contours are within normal limits. Persistent bilateral
layering pleural effusions with associated bibasilar airspace
opacities. Similar degree of mild pulmonary edema. No acute osseous
abnormality. No pneumothorax.
IMPRESSION: Essentially unchanged appearance of the chest with persistent
pulmonary edema, bilateral layering pleural effusions and associated
bibasilar atelectasis.

Stable and satisfactory support apparatus.

## 2021-04-14 ENCOUNTER — Encounter: Payer: Self-pay | Admitting: Gastroenterology

## 2021-04-15 ENCOUNTER — Encounter
Admission: AD | Disposition: A | Discharge status: Home / Self Care | Payer: Self-pay | Source: Home / Self Care | Attending: Internal Medicine

## 2021-04-15 ENCOUNTER — Inpatient Hospital Stay
Admission: AD | Admit: 2021-04-15 | Discharge: 2021-04-16 | DRG: 639 | Disposition: A | Discharge status: Home / Self Care | Payer: BC Managed Care – PPO | Attending: Osteopathic Medicine | Admitting: Osteopathic Medicine

## 2021-04-15 ENCOUNTER — Inpatient Hospital Stay: Payer: BC Managed Care – PPO | Admitting: Anesthesiology

## 2021-04-15 ENCOUNTER — Encounter: Payer: Self-pay | Admitting: Gastroenterology

## 2021-04-15 DIAGNOSIS — E785 Hyperlipidemia, unspecified: Secondary | ICD-10-CM

## 2021-04-15 DIAGNOSIS — Z91018 Allergy to other foods: Secondary | ICD-10-CM

## 2021-04-15 DIAGNOSIS — Z833 Family history of diabetes mellitus: Secondary | ICD-10-CM

## 2021-04-15 DIAGNOSIS — Z20822 Contact with and (suspected) exposure to covid-19: Secondary | ICD-10-CM | POA: Diagnosis present

## 2021-04-15 DIAGNOSIS — Z8249 Family history of ischemic heart disease and other diseases of the circulatory system: Secondary | ICD-10-CM

## 2021-04-15 DIAGNOSIS — E1065 Type 1 diabetes mellitus with hyperglycemia: Secondary | ICD-10-CM | POA: Diagnosis present

## 2021-04-15 DIAGNOSIS — K635 Polyp of colon: Secondary | ICD-10-CM | POA: Diagnosis present

## 2021-04-15 DIAGNOSIS — Z794 Long term (current) use of insulin: Secondary | ICD-10-CM

## 2021-04-15 DIAGNOSIS — F17210 Nicotine dependence, cigarettes, uncomplicated: Secondary | ICD-10-CM | POA: Diagnosis present

## 2021-04-15 DIAGNOSIS — T383X6A Underdosing of insulin and oral hypoglycemic [antidiabetic] drugs, initial encounter: Secondary | ICD-10-CM | POA: Diagnosis present

## 2021-04-15 DIAGNOSIS — K621 Rectal polyp: Secondary | ICD-10-CM | POA: Diagnosis present

## 2021-04-15 DIAGNOSIS — Z91013 Allergy to seafood: Secondary | ICD-10-CM | POA: Diagnosis not present

## 2021-04-15 DIAGNOSIS — Z888 Allergy status to other drugs, medicaments and biological substances status: Secondary | ICD-10-CM | POA: Diagnosis not present

## 2021-04-15 DIAGNOSIS — Z79899 Other long term (current) drug therapy: Secondary | ICD-10-CM | POA: Diagnosis not present

## 2021-04-15 DIAGNOSIS — Z720 Tobacco use: Secondary | ICD-10-CM

## 2021-04-15 DIAGNOSIS — Z7984 Long term (current) use of oral hypoglycemic drugs: Secondary | ICD-10-CM

## 2021-04-15 DIAGNOSIS — E1100 Type 2 diabetes mellitus with hyperosmolarity without nonketotic hyperglycemic-hyperosmolar coma (NKHHC): Secondary | ICD-10-CM | POA: Diagnosis present

## 2021-04-15 DIAGNOSIS — I10 Essential (primary) hypertension: Secondary | ICD-10-CM | POA: Diagnosis present

## 2021-04-15 DIAGNOSIS — E119 Type 2 diabetes mellitus without complications: Secondary | ICD-10-CM | POA: Diagnosis not present

## 2021-04-15 LAB — BASIC METABOLIC PANEL
Anion gap: 12 (ref 5–15)
Anion gap: 7 (ref 5–15)
Anion gap: 8 (ref 5–15)
Anion gap: 9 (ref 5–15)
BUN: 10 mg/dL (ref 6–20)
BUN: 8 mg/dL (ref 6–20)
BUN: 8 mg/dL (ref 6–20)
BUN: 6 mg/dL (ref 6–20)
CO2: 29 mmol/L (ref 22–32)
CO2: 29 mmol/L (ref 22–32)
CO2: 28 mmol/L (ref 22–32)
CO2: 30 mmol/L (ref 22–32)
Calcium: 10.3 mg/dL (ref 8.9–10.3)
Calcium: 9.6 mg/dL (ref 8.9–10.3)
Calcium: 9.4 mg/dL (ref 8.9–10.3)
Calcium: 9.2 mg/dL (ref 8.9–10.3)
Chloride: 88 mmol/L — ABNORMAL LOW (ref 98–111)
Chloride: 96 mmol/L — ABNORMAL LOW (ref 98–111)
Chloride: 98 mmol/L (ref 98–111)
Chloride: 98 mmol/L (ref 98–111)
Creatinine, Ser: 0.83 mg/dL (ref 0.61–1.24)
Creatinine, Ser: 0.72 mg/dL (ref 0.61–1.24)
Creatinine, Ser: 0.59 mg/dL — ABNORMAL LOW (ref 0.61–1.24)
Creatinine, Ser: 0.68 mg/dL (ref 0.61–1.24)
GFR, Estimated: >60 mL/min (ref >60)
GFR, Estimated: >60 mL/min (ref >60)
GFR, Estimated: >60 mL/min (ref >60)
GFR, Estimated: >60 mL/min (ref >60)
Glucose, Bld: 658 mg/dL (ref 70–99)
Glucose, Bld: 299 mg/dL — ABNORMAL HIGH (ref 70–99)
Glucose, Bld: 265 mg/dL — ABNORMAL HIGH (ref 70–99)
Glucose, Bld: 202 mg/dL — ABNORMAL HIGH (ref 70–99)
Potassium: 4.8 mmol/L (ref 3.5–5.1)
Potassium: 3.8 mmol/L (ref 3.5–5.1)
Potassium: 3.4 mmol/L — ABNORMAL LOW (ref 3.5–5.1)
Potassium: 3.5 mmol/L (ref 3.5–5.1)
Sodium: 129 mmol/L — ABNORMAL LOW (ref 135–145)
Sodium: 132 mmol/L — ABNORMAL LOW (ref 135–145)
Sodium: 134 mmol/L — ABNORMAL LOW (ref 135–145)
Sodium: 137 mmol/L (ref 135–145)

## 2021-04-15 LAB — GLUCOSE, CAPILLARY
Glucose-Capillary: >600 mg/dL (ref 70–99)
Glucose-Capillary: >600 mg/dL (ref 70–99)
Glucose-Capillary: 534 mg/dL (ref 70–99)
Glucose-Capillary: >600 mg/dL (ref 70–99)
Glucose-Capillary: 474 mg/dL — ABNORMAL HIGH (ref 70–99)
Glucose-Capillary: 346 mg/dL — ABNORMAL HIGH (ref 70–99)
Glucose-Capillary: 293 mg/dL — ABNORMAL HIGH (ref 70–99)
Glucose-Capillary: 200 mg/dL — ABNORMAL HIGH (ref 70–99)
Glucose-Capillary: 135 mg/dL — ABNORMAL HIGH (ref 70–99)
Glucose-Capillary: 190 mg/dL — ABNORMAL HIGH (ref 70–99)
Glucose-Capillary: 254 mg/dL — ABNORMAL HIGH (ref 70–99)
Glucose-Capillary: 165 mg/dL — ABNORMAL HIGH (ref 70–99)
Glucose-Capillary: 103 mg/dL — ABNORMAL HIGH (ref 70–99)
Glucose-Capillary: 118 mg/dL — ABNORMAL HIGH (ref 70–99)
Glucose-Capillary: 123 mg/dL — ABNORMAL HIGH (ref 70–99)

## 2021-04-15 LAB — CBC
HCT: 45.8 % (ref 39.0–52.0)
Hemoglobin: 15.2 g/dL (ref 13.0–17.0)
MCH: 33.4 pg (ref 26.0–34.0)
MCHC: 33.2 g/dL (ref 30.0–36.0)
MCV: 100.7 fL — ABNORMAL HIGH (ref 80.0–100.0)
Platelets: 135 10*3/uL — ABNORMAL LOW (ref 150–400)
RBC: 4.55 MIL/uL (ref 4.22–5.81)
RDW: 13 % (ref 11.5–15.5)
WBC: 5.7 10*3/uL (ref 4.0–10.5)
nRBC: 0 % (ref 0.0–0.2)

## 2021-04-15 LAB — COMPREHENSIVE METABOLIC PANEL
ALT: 15 U/L (ref 0–44)
AST: 14 U/L — ABNORMAL LOW (ref 15–41)
Albumin: 4.3 g/dL (ref 3.5–5.0)
Alkaline Phosphatase: 49 U/L (ref 38–126)
Anion gap: 14 (ref 5–15)
BUN: 10 mg/dL (ref 6–20)
CO2: 23 mmol/L (ref 22–32)
Calcium: 9.5 mg/dL (ref 8.9–10.3)
Chloride: 90 mmol/L — ABNORMAL LOW (ref 98–111)
Creatinine, Ser: 0.94 mg/dL (ref 0.61–1.24)
GFR, Estimated: >60 mL/min (ref >60)
Glucose, Bld: 1007 mg/dL (ref 70–99)
Potassium: 4.5 mmol/L (ref 3.5–5.1)
Sodium: 127 mmol/L — ABNORMAL LOW (ref 135–145)
Total Bilirubin: 1 mg/dL (ref 0.3–1.2)
Total Protein: 7.5 g/dL (ref 6.5–8.1)

## 2021-04-15 LAB — BLOOD GAS, VENOUS
Acid-Base Excess: 0.3 mmol/L (ref 0.0–2.0)
Bicarbonate: 26 mmol/L (ref 20.0–28.0)
O2 Saturation: 97.7 %
Patient temperature: 37
pCO2, Ven: 45 mmHg (ref 44.0–60.0)
pH, Ven: 7.37 (ref 7.250–7.430)
pO2, Ven: 102 mmHg — ABNORMAL HIGH (ref 32.0–45.0)

## 2021-04-15 LAB — RESP PANEL BY RT-PCR (FLU A&B, COVID) ARPGX2
Influenza A by PCR: NEGATIVE
Influenza B by PCR: NEGATIVE
SARS Coronavirus 2 by RT PCR: NEGATIVE

## 2021-04-15 LAB — OSMOLALITY: Osmolality: 315 mOsm/kg — ABNORMAL HIGH (ref 275–295)

## 2021-04-15 LAB — BETA-HYDROXYBUTYRIC ACID: Beta-Hydroxybutyric Acid: 2.08 mmol/L — ABNORMAL HIGH (ref 0.05–0.27)

## 2021-04-15 LAB — HIV ANTIBODY (ROUTINE TESTING W REFLEX): HIV Screen 4th Generation wRfx: NONREACTIVE

## 2021-04-15 LAB — MRSA NEXT GEN BY PCR, NASAL: MRSA by PCR Next Gen: NOT DETECTED

## 2021-04-15 SURGERY — COLONOSCOPY WITH PROPOFOL
Anesthesia: General

## 2021-04-15 MED ORDER — CHLORHEXIDINE GLUCONATE CLOTH 2 % EX PADS
6.0000 | MEDICATED_PAD | Freq: Every day | CUTANEOUS | Status: DC
  Administered 2021-04-15: 6 via TOPICAL

## 2021-04-15 MED ORDER — HYDRALAZINE HCL 20 MG/ML IJ SOLN: 5.0000 mg | INTRAMUSCULAR | Status: DC | PRN

## 2021-04-15 MED ORDER — ACETAMINOPHEN 325 MG PO TABS: 650.0000 mg | ORAL_TABLET | Freq: Four times a day (QID) | ORAL | Status: DC | PRN

## 2021-04-15 MED ORDER — PEG 3350-KCL-NA BICARB-NACL 420 G PO SOLR
4000.0000 mL | Freq: Once | ORAL | Status: AC
  Administered 2021-04-15: 4000 mL via ORAL
  Filled 2021-04-15 (×2): qty 4000

## 2021-04-15 MED ORDER — CARVEDILOL 12.5 MG PO TABS
25.0000 mg | ORAL_TABLET | Freq: Two times a day (BID) | ORAL | Status: DC
  Administered 2021-04-15 (×2): 25 mg via ORAL
  Filled 2021-04-15 (×2): qty 2

## 2021-04-15 MED ORDER — SODIUM CHLORIDE 0.9 % IV SOLN: INTRAVENOUS | Status: DC

## 2021-04-15 MED ORDER — POTASSIUM CHLORIDE CRYS ER 20 MEQ PO TBCR
40.0000 meq | EXTENDED_RELEASE_TABLET | Freq: Once | ORAL | Status: AC
  Administered 2021-04-15: 40 meq via ORAL
  Filled 2021-04-15: qty 2

## 2021-04-15 MED ORDER — PHENYLEPHRINE HCL (PRESSORS) 10 MG/ML IV SOLN
INTRAVENOUS | Status: AC
  Filled 2021-04-15: qty 1

## 2021-04-15 MED ORDER — ONDANSETRON HCL 4 MG/2ML IJ SOLN: 4.0000 mg | Freq: Three times a day (TID) | INTRAMUSCULAR | Status: DC | PRN

## 2021-04-15 MED ORDER — FENOFIBRATE 160 MG PO TABS
160.0000 mg | ORAL_TABLET | Freq: Every morning | ORAL | Status: DC
  Filled 2021-04-15: qty 1

## 2021-04-15 MED ORDER — INSULIN REGULAR(HUMAN) IN NACL 100-0.9 UT/100ML-% IV SOLN
INTRAVENOUS | Status: DC
  Administered 2021-04-15: 19 [IU]/h via INTRAVENOUS
  Administered 2021-04-15: 2.2 [IU]/h via INTRAVENOUS
  Administered 2021-04-16: 0.7 [IU]/h via INTRAVENOUS
  Filled 2021-04-15 (×2): qty 100

## 2021-04-15 MED ORDER — GABAPENTIN 300 MG PO CAPS
300.0000 mg | ORAL_CAPSULE | Freq: Three times a day (TID) | ORAL | Status: DC
  Administered 2021-04-15 (×2): 300 mg via ORAL
  Filled 2021-04-15 (×2): qty 1

## 2021-04-15 MED ORDER — DEXTROSE 50 % IV SOLN: 0.0000 mL | INTRAVENOUS | Status: DC | PRN

## 2021-04-15 MED ORDER — BENAZEPRIL HCL 20 MG PO TABS
20.0000 mg | ORAL_TABLET | Freq: Every day | ORAL | Status: DC
  Administered 2021-04-15: 20 mg via ORAL
  Filled 2021-04-15 (×2): qty 1

## 2021-04-15 MED ORDER — NICOTINE 21 MG/24HR TD PT24
21.0000 mg | MEDICATED_PATCH | Freq: Every day | TRANSDERMAL | Status: DC
  Administered 2021-04-15: 21 mg via TRANSDERMAL
  Filled 2021-04-15: qty 1

## 2021-04-15 MED ORDER — LACTATED RINGERS IV BOLUS
1000.0000 mL | Freq: Once | INTRAVENOUS | Status: DC
  Administered 2021-04-15: 1000 mL via INTRAVENOUS

## 2021-04-15 MED ORDER — PROPOFOL 500 MG/50ML IV EMUL
INTRAVENOUS | Status: AC
  Filled 2021-04-15: qty 50

## 2021-04-15 MED ORDER — AMLODIPINE BESYLATE 10 MG PO TABS
10.0000 mg | ORAL_TABLET | Freq: Every day | ORAL | Status: DC
  Administered 2021-04-15: 10 mg via ORAL
  Filled 2021-04-15: qty 1

## 2021-04-15 MED ORDER — LACTATED RINGERS IV SOLN: INTRAVENOUS | Status: DC

## 2021-04-15 MED ORDER — LACTATED RINGERS IV BOLUS
2000.0000 mL | Freq: Once | INTRAVENOUS | Status: AC
Start: 2021-04-15 — End: 2021-04-15
  Administered 2021-04-15: 2000 mL via INTRAVENOUS

## 2021-04-15 MED ORDER — LACTATED RINGERS IV BOLUS: 3000.0000 mL | Freq: Once | INTRAVENOUS | Status: DC

## 2021-04-15 MED ORDER — ROSUVASTATIN CALCIUM 10 MG PO TABS
40.0000 mg | ORAL_TABLET | Freq: Every evening | ORAL | Status: DC
  Administered 2021-04-15: 40 mg via ORAL
  Filled 2021-04-15: qty 4

## 2021-04-15 MED ORDER — POTASSIUM CHLORIDE 10 MEQ/100ML IV SOLN
10.0000 meq | INTRAVENOUS | Status: AC
  Administered 2021-04-15 (×2): 10 meq via INTRAVENOUS
  Filled 2021-04-15 (×2): qty 100

## 2021-04-15 MED ORDER — DEXTROSE IN LACTATED RINGERS 5 % IV SOLN: INTRAVENOUS | Status: DC

## 2021-04-15 NOTE — Progress Notes (Signed)
GI Brief Pre-Op Note  Pt's blood sugar is undetectable as it is >600. Anesthesia is performing additional labs. Counseled patient on high glucose and how this can be a diabetic emergency. He apparently did not take his insulin over the last few days. Anesthesia will recommend patient going to emergency department pending labs. Colonoscopy will be canceled today to be scheduled at a later date.  Jaynie Collins, DO  [facility]

## 2021-04-15 NOTE — Anesthesia Preprocedure Evaluation (Addendum)
Anesthesia Evaluation  Patient identified by MRN, date of birth, ID band Patient awake    Reviewed: Allergy & Precautions, NPO status , Patient's Chart, lab work & pertinent test results  Airway        Dental   Pulmonary Current Smoker,           Cardiovascular hypertension, Pt. on home beta blockers and Pt. on medications  Rhythm:Irregular Rate:Tachycardia     Neuro/Psych negative neurological ROS  negative psych ROS   GI/Hepatic negative GI ROS, Neg liver ROS,   Endo/Other  diabetes, Poorly Controlled, Type 1, Insulin Dependent  Renal/GU negative Renal ROS  negative genitourinary   Musculoskeletal   Abdominal   Peds  Hematology negative hematology ROS (+)   Anesthesia Other Findings Past Medical History: No date: Diabetes mellitus without complication (HCC) No date: Hypertension  Past Surgical History: No date: none No date: TONSILLECTOMY     Reproductive/Obstetrics negative OB ROS                            Anesthesia Physical Anesthesia Plan  ASA:   Anesthesia Plan: General   Post-op Pain Management:    Induction:   PONV Risk Score and Plan:   Airway Management Planned:   Additional Equipment:   Intra-op Plan:   Post-operative Plan:   Informed Consent:   Plan Discussed with:   Anesthesia Plan Comments: (Pt had hyperglycemia on pre-op finger stick >600. Pt reports not being on insulin for days. A VBG and BMP were collected revealing anion gap 14. Pt had polydipsia and polyuria and a HR of 101 in pre-op. A hospitalist was consulted who admitted patient to treat with an insuline infusion. Pt was informed on the plain throughout the work-up and understood the urgency. )       Anesthesia Quick Evaluation

## 2021-04-15 NOTE — Assessment & Plan Note (Signed)
-   IV hydralazine as needed -Lotrel, Coreg,

## 2021-04-15 NOTE — Assessment & Plan Note (Signed)
Recent A1c 9.2, poorly controlled.  Patient's not taking his medications.  Now has HHS. -Currently patient is on insulin drip -Would wait to transition until he's had at least 2-3 CBGs consecutively below 180. Then start Semglee 40 units Daily (0.4 units/kg) + Novolog 0-20 units TID AC + HS

## 2021-04-15 NOTE — H&P (Signed)
History and Physical    Patient: Randy Deleon [id number] DOB: 1965-09-29 DOA: 04/15/2021 DOS: the patient was seen and examined on 04/15/2021 PCP: [name], MD  Patient coming from: Home  Chief Complaint: Polyuria, polydipsia, elevated blood sugar  HPI: Randy Deleon is a 56 y.o. male with medical history significant of type 1 diabetes, hypertension, hyperlipidemia, tobacco abuse, who presents with polyuria, polydipsia, elevated blood sugar.  Patient is scheduled for screening colonoscopy by Dr. Virgina Jock of GI today.  BMP showed that patient has elevated blood sugar 1007, bicarbonate of 23 and anion gap of 14.  Procedure is canceled and rescheduled for tomorrow.  Patient states that he has not been taking his diabetic medication for several days.  He reports polyuria and polydipsia.  No dysuria or burning on urination.  Patient does not have chest pain, cough, shortness breath.  No fever or chills.  No nausea vomiting, diarrhea or abdominal pain.  Patient is supposed to take, Toujeo 100 units daily,  Novolog 3-20 units TID per SSI,  Metformin 1000 mg BID, Farxiga.  Invokana 300 mg daily is also on his medication list, but patient states that he is not taking well, recently.   Data review: I have personally reviewed labs and imaging studies.  Blood sugar 1007, anion gap 14, negative COVID PCR, pseudohyponatremia, GFR > 60, WBC 5.7, temperature normal, blood pressure 152/81, heart rate 101, RR 20, oxygen saturation 97% on room air.  EKG: Not done yet, will get one  Review of Systems:   General: no fevers, chills, no body weight gain, fatigue HEENT: no blurry vision, hearing changes or sore throat Respiratory: no dyspnea, coughing, wheezing CV: no chest pain, no palpitations GI: no nausea, vomiting, abdominal pain, diarrhea, constipation GU: no dysuria, burning on urination, has increased urinary frequency, no hematuria  Ext: no leg edema Neuro: no unilateral weakness,  numbness, or tingling, no vision change or hearing loss Skin: no rash, no skin tear. MSK: No muscle spasm, no deformity, no limitation of range of movement in spin Heme: No easy bruising.  Travel history: No recent long distant travel.   Past Medical History:  Diagnosis Date   Diabetes mellitus without complication ([place])    Hypertension    Past Surgical History:  Procedure Laterality Date   none     TONSILLECTOMY     Social History:  reports that he has been smoking cigarettes. He started smoking about 24 years ago. He has a 10.00 pack-year smoking history. He has never used smokeless tobacco. He reports that he does not currently use alcohol. He reports that he does not currently use drugs.  Allergies  Allergen Reactions   Contrast Media [Iodinated Contrast Media]     Renal failure   Other Rash and Other (See Comments)    Tested allergic to LIMA BEANS   Shellfish Allergy Rash and Other (See Comments)    TESTED ALLERGIC TO "SHELLFISH"    Family History  Problem Relation Age of Onset   Diabetes Mother    Hypertension Mother    Congestive Heart Failure Father    Diabetes Father    Hypertension Father     Prior to Admission medications   Medication Sig Start Date End Date Taking? Authorizing Provider  amLODipine-benazepril (LOTREL) 10-20 MG capsule Take 1 capsule by mouth every morning. 05/30/19  Yes [provider]  carvedilol (COREG) 25 MG tablet Take 25 mg by mouth 2 (two) times daily. 05/30/19  Yes [provider]  Cholecalciferol (VITAMIN D3) 125 MCG (5000 UT) CAPS Take 5,000 Units by mouth daily.   Yes [provider]  dapagliflozin propanediol (FARXIGA) 10 MG TABS tablet Take by mouth daily.   Yes [provider]  fenofibrate 160 MG tablet Take 160 mg by mouth every morning. 05/30/19  Yes [provider]  gabapentin (NEURONTIN) 300 MG capsule Take 300 mg by mouth 3 (three) times daily. 06/16/19  Yes [provider]   icosapent Ethyl (VASCEPA) 1 g capsule Take 2 g by mouth 2 (two) times daily.   Yes [provider]  insulin aspart (NOVOLOG) 100 UNIT/ML FlexPen Use TID with meals and bedtime: CBG 121-150: 3u; CBG 151-200: 4u; CBG 201-250: 7u; CBG 251-300: 11u; CBG 301- 350: 15u; CBG 351-400: 20u 03/30/18  Yes Mikhail, Maryann, DO  insulin glargine, 2 Unit Dial, (TOUJEO MAX SOLOSTAR) 300 UNIT/ML Solostar Pen Inject 100 Units into the skin.   Yes [provider]  INVOKANA 300 MG TABS tablet Take 300 mg by mouth every morning. 06/02/19  Yes [provider]  metFORMIN (GLUCOPHAGE) 1000 MG tablet Take 1,000 mg by mouth 2 (two) times daily. 05/14/19  Yes [provider]  sildenafil (VIAGRA) 100 MG tablet Take 100 mg by mouth daily as needed. 06/16/19   [provider]    Physical Exam: Vitals:   04/15/21 0726 04/15/21 1032 04/15/21 1200 04/15/21 1505  BP: (!) 152/81 (!) 171/90 (!) 135/96 133/75  Pulse: (!) 101  86 77  Resp: 20 (!) 22  18  Temp: (!) 97.3 F (36.3 C) 98.4 F (36.9 C)    TempSrc: Temporal Oral    SpO2: 97% 94% 99% 100%  Weight:  100.1 kg    Height: 5\' 9"  (1.753 m)       Physical exam:  General: Not in acute distress.  Dry mucous membrane HEENT:       Eyes: PERRL, EOMI, no scleral icterus.       ENT: No discharge from the ears and nose, no pharynx injection, no tonsillar enlargement.        Neck: No JVD, no bruit, no mass felt. Heme: No neck lymph node enlargement. Cardiac: S1/S2, RRR, No murmurs, No gallops or rubs. Respiratory: No rales, wheezing, rhonchi or rubs. GI: Soft, nondistended, nontender, no rebound pain, no organomegaly, BS present. GU: No hematuria Ext: No pitting leg edema bilaterally. 1+DP/PT pulse bilaterally. Musculoskeletal: No joint deformities, No joint redness or warmth, no limitation of ROM in spin. Skin: No rashes.  Neuro: Alert, oriented X3, cranial nerves II-XII grossly intact, moves all extremities normally.  Psych:  Patient is not psychotic, no suicidal or hemocidal ideation.    Assessment/Plan * Hyperosmolar hyperglycemic state (HHS) ([place])- (present on admission) Blood sugar 1007, normal anion gap, no DKA.  Osmolality 315.  Patient mental status normal.  This is most likely due to noncompliance to diabetic medications.  - Admit to stepdown  - IVF:  3L of LR bolus - start insulin gtt with BMP q4h - IVF: LR at 125 cc/h, will switch to D5-LR at 125 cc/h when CBG<250 - replete K as needed - Zofran prn nausea  - consult to diabetic educator  Diabetes mellitus without complication ([place]) Recent A1c 9.2, poorly controlled.  Patient's not taking his medications.  Now has HHS. -Currently patient is on insulin drip -Would wait to transition until he's had at least 2-3 CBGs consecutively below 180. Then start Semglee 40 units Daily (0.4 units/kg) + Novolog 0-20 units TID AC +  HS  HLD (hyperlipidemia)- (present on admission) - Crestor, fenofibrate  HTN (hypertension)- (present on admission) - IV hydralazine as needed -Lotrel, Coreg,  Tobacco use- (present on admission) - Nicotine patch   Colonoscopy screening: -Patient needs to be n.p.o. after midnight -GI will do colonoscopy tomorrow   Advance Care Planning:   Code Status: Full Code full code  Consults: Dr. Virgina Jock of GI  Family Communication: not, no family at bedside  DVT PPx: SCD  Severity of Illness: The appropriate patient status for this patient is INPATIENT. Inpatient status is judged to be reasonable and necessary in order to provide the required intensity of service to ensure the patient's safety. [name] presenting symptoms, physical exam findings, and initial radiographic and laboratory data in the context of their chronic comorbidities is felt to place them at high risk for further clinical deterioration. Furthermore, it is not anticipated that the patient will be medically stable for discharge from the hospital within 2  midnights of admission.   * I certify that at the point of admission it is my clinical judgment that the patient will require inpatient hospital care spanning beyond 2 midnights from the point of admission due to high intensity of service, high risk for further deterioration and high frequency of surveillance required.*  Author: Ivor Costa, MD 04/15/2021 6:19 PM  For on call review [email or web address].

## 2021-04-15 NOTE — Care Plan (Signed)
Patient's blood glucose is >1000. Recommend patient go to the emergency department. AG 14

## 2021-04-15 NOTE — Progress Notes (Signed)
Inpatient Diabetes Program Recommendations  AACE/ADA: New Consensus Statement on Inpatient Glycemic Control (2015)  Target Ranges:  Prepandial:   less than 140 mg/dL      Peak postprandial:   less than 180 mg/dL (1-2 hours)      Critically ill patients:  140 - 180 mg/dL    Latest Reference Range & Units 04/15/21 07:32  Sodium 135 - 145 mmol/L 127 (L)  Potassium 3.5 - 5.1 mmol/L 4.5  Chloride 98 - 111 mmol/L 90 (L)  CO2 22 - 32 mmol/L 23  Glucose 70 - 99 mg/dL 1,007 (HH)  BUN 6 - 20 mg/dL 10  Creatinine 0.61 - 1.24 mg/dL 0.94  Calcium 8.9 - 10.3 mg/dL 9.5  Anion gap 5 - 15  14    Latest Reference Range & Units 04/15/21 07:04 04/15/21 07:06 04/15/21 10:30  Glucose-Capillary 70 - 99 mg/dL >600 (HH) >600 (HH) >600 (HH)  IV Insulin Started $RemoveBeforeD'@[id number]'[id number]$     Here for Colonoscopy--CBG >600 on arrival--pt told MD he didn't take insulin for a few days--Sent to ED for Eval  History: DM   Home DM Meds: Toujeo 100 units daily    Novolog 3-20 units TID per SSI    Metformin 1000 mg BID   Invokana 300 mg daily   (Farxiga also listed)    Current Orders: IV Insulin Drip    PCP: [facility]  A1c pending   Addendum 11:45am--Met w/ pt at bedside.  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  Does not sleep many hours per day.  Has Toujeo and Novolog insulins at home (in addition to the Metformin) but not taking any of his meds on a consistent basis due to his busy work schedule.  Supposed to be taking Toujeo 96 units daily + Novolog on a sliding scale basis--usually takes 14 units Novolog when he actually takes it.  Has Freestyle Libre sensor at home but hasn't worn in months.  Does not check CBGs consistently either.  Told me he has lost over 40 pounds since he was started on insulin back in early 2020.  Has not had insulin adjustments in a long time.  Admits to having issues with Hypoglycemia sometimes (when he  takes his insulin).  Told me he knows his sugar control is important and needs to take the time to take better care of his diabetes and that he understands the devastating effects of high CBGs.  Understands how to take his insulin, just needs to be more consistent.  Reviewed with pt the treatments we are giving him for high CBGs and the plan to transition to SQ Insulin when labs and CBGs are stable.  Reinforced the importance of better CBG control.  Reviewed his last A1c with pt and reminded pt we have a current A1c level pending.  Reviewed home goal CBGs and goal A1c levels.  Encouraged pt to start his Freestyle sensor when he goes home and ask his PCP for more refills on the sensor.  Also strongly encouraged pt to make a follow up appt with his PCP for further insulin adjustments.  Discussed with pt that if he starts consistently taking his insulin and has issues with HYPO that he should call his PCP for insulin adjustments asap.  Pt appreciative of visit and did not have any further questions for me.    --Will follow patient during hospitalization--  Wyn Quaker RN, MSN, CDE Diabetes  Coordinator Inpatient Glycemic Control Team Team Pager: [phone] (8a-5p)

## 2021-04-15 NOTE — Assessment & Plan Note (Signed)
-  Nicotine patch 

## 2021-04-15 NOTE — Assessment & Plan Note (Signed)
-   Crestor, fenofibrate

## 2021-04-15 NOTE — Assessment & Plan Note (Signed)
Blood sugar 1007, normal anion gap, no DKA.  Osmolality 315.  Patient mental status normal.  This is most likely due to noncompliance to diabetic medications.  - Admit to stepdown  - IVF:  3L of LR bolus - start insulin gtt with BMP q4h - IVF: LR at 125 cc/h, will switch to D5-LR at 125 cc/h when CBG<250 - replete K as needed - Zofran prn nausea  - consult to diabetic educator

## 2021-04-16 ENCOUNTER — Inpatient Hospital Stay: Payer: BC Managed Care – PPO | Admitting: Anesthesiology

## 2021-04-16 ENCOUNTER — Encounter
Admission: AD | Disposition: A | Discharge status: Home / Self Care | Payer: Self-pay | Source: Home / Self Care | Attending: Internal Medicine

## 2021-04-16 DIAGNOSIS — E1100 Type 2 diabetes mellitus with hyperosmolarity without nonketotic hyperglycemic-hyperosmolar coma (NKHHC): Secondary | ICD-10-CM | POA: Diagnosis not present

## 2021-04-16 DIAGNOSIS — K635 Polyp of colon: Secondary | ICD-10-CM | POA: Diagnosis not present

## 2021-04-16 HISTORY — PX: COLONOSCOPY WITH PROPOFOL: SHX5780

## 2021-04-16 LAB — GLUCOSE, CAPILLARY
Glucose-Capillary: 127 mg/dL — ABNORMAL HIGH (ref 70–99)
Glucose-Capillary: 138 mg/dL — ABNORMAL HIGH (ref 70–99)
Glucose-Capillary: 149 mg/dL — ABNORMAL HIGH (ref 70–99)
Glucose-Capillary: 157 mg/dL — ABNORMAL HIGH (ref 70–99)
Glucose-Capillary: 182 mg/dL — ABNORMAL HIGH (ref 70–99)
Glucose-Capillary: 189 mg/dL — ABNORMAL HIGH (ref 70–99)
Glucose-Capillary: 198 mg/dL — ABNORMAL HIGH (ref 70–99)
Glucose-Capillary: 221 mg/dL — ABNORMAL HIGH (ref 70–99)
Glucose-Capillary: 226 mg/dL — ABNORMAL HIGH (ref 70–99)
Glucose-Capillary: 228 mg/dL — ABNORMAL HIGH (ref 70–99)
Glucose-Capillary: 98 mg/dL (ref 70–99)

## 2021-04-16 LAB — BASIC METABOLIC PANEL
Anion gap: 8 (ref 5–15)
Anion gap: 8 (ref 5–15)
Anion gap: 6 (ref 5–15)
BUN: 6 mg/dL (ref 6–20)
BUN: <5 mg/dL — ABNORMAL LOW (ref 6–20)
BUN: <5 mg/dL — ABNORMAL LOW (ref 6–20)
CO2: 28 mmol/L (ref 22–32)
CO2: 27 mmol/L (ref 22–32)
CO2: 27 mmol/L (ref 22–32)
Calcium: 9.2 mg/dL (ref 8.9–10.3)
Calcium: 9.5 mg/dL (ref 8.9–10.3)
Calcium: 8.8 mg/dL — ABNORMAL LOW (ref 8.9–10.3)
Chloride: 100 mmol/L (ref 98–111)
Chloride: 102 mmol/L (ref 98–111)
Chloride: 105 mmol/L (ref 98–111)
Creatinine, Ser: 0.57 mg/dL — ABNORMAL LOW (ref 0.61–1.24)
Creatinine, Ser: 0.52 mg/dL — ABNORMAL LOW (ref 0.61–1.24)
Creatinine, Ser: 0.5 mg/dL — ABNORMAL LOW (ref 0.61–1.24)
GFR, Estimated: >60 mL/min (ref >60)
GFR, Estimated: >60 mL/min (ref >60)
GFR, Estimated: >60 mL/min (ref >60)
Glucose, Bld: 184 mg/dL — ABNORMAL HIGH (ref 70–99)
Glucose, Bld: 151 mg/dL — ABNORMAL HIGH (ref 70–99)
Glucose, Bld: 95 mg/dL (ref 70–99)
Potassium: 4 mmol/L (ref 3.5–5.1)
Potassium: 3.9 mmol/L (ref 3.5–5.1)
Potassium: 3.6 mmol/L (ref 3.5–5.1)
Sodium: 136 mmol/L (ref 135–145)
Sodium: 137 mmol/L (ref 135–145)
Sodium: 138 mmol/L (ref 135–145)

## 2021-04-16 SURGERY — COLONOSCOPY WITH PROPOFOL
Anesthesia: General

## 2021-04-16 MED ORDER — POTASSIUM CHLORIDE 10 MEQ/100ML IV SOLN
10.0000 meq | INTRAVENOUS | Status: AC
  Administered 2021-04-16 (×4): 10 meq via INTRAVENOUS
  Filled 2021-04-16 (×4): qty 100

## 2021-04-16 MED ORDER — PROPOFOL 10 MG/ML IV BOLUS
INTRAVENOUS | Status: DC | PRN
Start: 2021-04-16 — End: 2021-04-16
  Administered 2021-04-16: 10 mg via INTRAVENOUS
  Administered 2021-04-16: 90 mg via INTRAVENOUS

## 2021-04-16 MED ORDER — INSULIN GLARGINE-YFGN 100 UNIT/ML ~~LOC~~ SOLN
40.0000 [IU] | Freq: Two times a day (BID) | SUBCUTANEOUS | Status: DC
  Administered 2021-04-16: 40 [IU] via SUBCUTANEOUS
  Filled 2021-04-16 (×2): qty 0.4

## 2021-04-16 MED ORDER — LIDOCAINE HCL (CARDIAC) PF 100 MG/5ML IV SOSY
PREFILLED_SYRINGE | INTRAVENOUS | Status: DC | PRN
  Administered 2021-04-16: 50 mg via INTRAVENOUS

## 2021-04-16 MED ORDER — PROPOFOL 500 MG/50ML IV EMUL
INTRAVENOUS | Status: DC | PRN
  Administered 2021-04-16: 150 ug/kg/min via INTRAVENOUS

## 2021-04-16 MED ORDER — INSULIN GLARGINE-YFGN 100 UNIT/ML ~~LOC~~ SOLN
40.0000 [IU] | Freq: Every day | SUBCUTANEOUS | Status: DC
  Filled 2021-04-16: qty 0.4

## 2021-04-16 MED ORDER — INSULIN GLARGINE-YFGN 100 UNIT/ML ~~LOC~~ SOLN
40.0000 [IU] | Freq: Once | SUBCUTANEOUS | Status: AC
  Administered 2021-04-16: 40 [IU] via SUBCUTANEOUS
  Filled 2021-04-16: qty 0.4

## 2021-04-16 MED ORDER — PHENYLEPHRINE 40 MCG/ML (10ML) SYRINGE FOR IV PUSH (FOR BLOOD PRESSURE SUPPORT)
PREFILLED_SYRINGE | INTRAVENOUS | Status: DC | PRN
  Administered 2021-04-16 (×2): 160 ug via INTRAVENOUS

## 2021-04-16 MED ORDER — PROPOFOL 500 MG/50ML IV EMUL
INTRAVENOUS | Status: AC
  Filled 2021-04-16: qty 50

## 2021-04-16 MED ORDER — ACETAMINOPHEN 325 MG PO TABS: 650.0000 mg | ORAL_TABLET | Freq: Four times a day (QID) | ORAL | Status: AC | PRN

## 2021-04-16 MED ORDER — LIDOCAINE HCL (PF) 2 % IJ SOLN
INTRAMUSCULAR | Status: AC
  Filled 2021-04-16: qty 5

## 2021-04-16 MED ORDER — SODIUM CHLORIDE 0.9 % IV SOLN
INTRAVENOUS | Status: DC | PRN
Start: 2021-04-16 — End: 2021-04-16

## 2021-04-16 MED ORDER — SODIUM CHLORIDE 0.9 % IV SOLN: INTRAVENOUS | Status: DC

## 2021-04-16 MED ORDER — INSULIN ASPART 100 UNIT/ML IJ SOLN: 0.0000 [IU] | Freq: Three times a day (TID) | INTRAMUSCULAR | Status: DC

## 2021-04-16 NOTE — Progress Notes (Signed)
EndoTool indicated to consider transition. Notified NP Steward Drone and will hold off on discontinuing the endotool for now since patient is NPO for a coloscopy procedure in the morning. Patient did sign the consent and it is on the chart. Patient's IV infiltrated. IV team consulted and establishing a new IV access at this time NP made aware.

## 2021-04-16 NOTE — Op Note (Signed)
[facility] Gastroenterology Patient Name: Randy Deleon Procedure Date: 04/16/2021 9:04 AM MRN: [id number] Account #: [id number] Date of Birth: 1965-06-19 Admit Type: Outpatient Age: 56 Room: [facility] ENDO ROOM 4 Gender: Male Note Status: Finalized Instrument Name: Colonoscope [id number] Procedure:             Colonoscopy Indications:           Screening for colorectal malignant neoplasm, Last                         colonoscopy: November 2002 Providers:             Toney Reil MD, MD Medicines:             General Anesthesia Complications:         No immediate complications. Estimated blood loss: None. Procedure:             Pre-Anesthesia Assessment:                        - Prior to the procedure, a History and Physical was                         performed, and patient medications and allergies were                         reviewed. The patient is competent. The risks and                         benefits of the procedure and the sedation options and                         risks were discussed with the patient. All questions                         were answered and informed consent was obtained.                         Patient identification and proposed procedure were                         verified by the physician, the nurse, the                         anesthesiologist, the anesthetist and the technician                         in the pre-procedure area in the procedure room in the                         endoscopy suite. Mental Status Examination: alert and                         oriented. Airway Examination: normal oropharyngeal                         airway and neck mobility. Respiratory Examination:                         clear to auscultation. CV Examination:  normal.                         Prophylactic Antibiotics: The patient does not require                         prophylactic antibiotics. Prior Anticoagulants: The                          patient has taken no previous anticoagulant or                         antiplatelet agents. ASA Grade Assessment: III - A                         patient with severe systemic disease. After reviewing                         the risks and benefits, the patient was deemed in                         satisfactory condition to undergo the procedure. The                         anesthesia plan was to use general anesthesia.                         Immediately prior to administration of medications,                         the patient was re-assessed for adequacy to receive                         sedatives. The heart rate, respiratory rate, oxygen                         saturations, blood pressure, adequacy of pulmonary                         ventilation, and response to care were monitored                         throughout the procedure. The physical status of the                         patient was re-assessed after the procedure.                        After obtaining informed consent, the colonoscope was                         passed under direct vision. Throughout the procedure,                         the patient's blood pressure, pulse, and oxygen                         saturations were monitored continuously. The  Colonoscope was introduced through the anus and                         advanced to the the cecum, identified by appendiceal                         orifice and ileocecal valve. The colonoscopy was                         performed with moderate difficulty due to the                         patient's body habitus. Successful completion of the                         procedure was aided by applying abdominal pressure.                         The patient tolerated the procedure well. The quality                         of the bowel preparation was evaluated using the BBPS                         [facility] Bowel Preparation Scale) with scores of: Right                          Colon = 2 (minor amount of residual staining, small                         fragments of stool and/or opaque liquid, but mucosa                         seen well), Transverse Colon = 2 (minor amount of                         residual staining, small fragments of stool and/or                         opaque liquid, but mucosa seen well) and Left Colon =                         2 (minor amount of residual staining, small fragments                         of stool and/or opaque liquid, but mucosa seen well).                         The total BBPS score equals 6. Findings:      The perianal and digital rectal examinations were normal. Pertinent       negatives include normal sphincter tone and no palpable rectal lesions.      Five sessile polyps were found in the rectum and sigmoid colon. The       polyps were 3 to 4 mm in size. These polyps were removed with a cold       snare. Resection and retrieval were complete.  Estimated blood loss: none.      The retroflexed view of the distal rectum and anal verge was normal and       showed no anal or rectal abnormalities. Impression:            - Five 3 to 4 mm polyps in the rectum and in the                         sigmoid colon, removed with a cold snare. Resected and                         retrieved.                        - The distal rectum and anal verge are normal on                         retroflexion view. Recommendation:        - Discharge patient to home (with escort).                        - Resume previous diet today.                        - Continue present medications.                        - Await pathology results.                        - Repeat colonoscopy in 5 years for surveillance. Procedure Code(s):     --- Professional ---                        [phone], Colonoscopy, flexible; with removal of                         tumor(s), polyp(s), or other lesion(s) by snare                          technique Diagnosis Code(s):     --- Professional ---                        Z12.11, Encounter for screening for malignant neoplasm                         of colon                        K62.1, Rectal polyp                        K63.5, Polyp of colon CPT copyright 2019 American Medical Association. All rights reserved. The codes documented in this report are preliminary and upon coder review may  be revised to meet current compliance requirements. Dr. Libby Mawonini Virlee Stroschein Toney Reilohini Reddy Tracina Beaumont MD, MD 04/16/2021 9:51:47 AM This report has been signed electronically. Number of Addenda: 0 Note Initiated On: 04/16/2021 9:04 AM Scope Withdrawal Time: 0 hours 16 minutes 57 seconds  Total Procedure Duration: 0 hours 23 minutes 16 seconds  Estimated Blood Loss:  Estimated blood  loss: none.      [facility]

## 2021-04-16 NOTE — Progress Notes (Deleted)
The [employer] closed, cbg below 180. Transition from endotool IV insulin to SQ insulin management

## 2021-04-16 NOTE — Anesthesia Preprocedure Evaluation (Addendum)
Anesthesia Evaluation  Patient identified by MRN, date of birth, ID band Patient awake    Reviewed: Allergy & Precautions, NPO status , Patient's Chart, lab work & pertinent test results  Airway Mallampati: III  TM Distance: >3 FB Neck ROM: full    Dental no notable dental hx.    Pulmonary Current Smoker and Patient abstained from smoking.,    Pulmonary exam normal        Cardiovascular Exercise Tolerance: Good hypertension, Pt. on home beta blockers and Pt. on medications Normal cardiovascular exam Rhythm:Irregular Rate:Tachycardia     Neuro/Psych negative neurological ROS  negative psych ROS   GI/Hepatic negative GI ROS, Neg liver ROS,   Endo/Other  diabetes (Recent A1c 9.2), Poorly Controlled, Type 2, Insulin Dependent  Renal/GU negative Renal ROS  negative genitourinary   Musculoskeletal   Abdominal (+) + obese,   Peds  Hematology negative hematology ROS (+)   Anesthesia Other Findings Pt had hyperglycemia on pre-op visit 1/27 finger stick >600. Pt reported not being on insulin for days. A VBG and BMP were collected revealing anion gap 14. Pt had polydipsia and polyuria and a HR of 101 in pre-op. A hospitalist was consulted who admitted patient to treat with an insuline infusion. He was placed on endotool. His hyperglycemia has resolved. He was given IVF yesterday.   Past Medical History: No date: Diabetes mellitus without complication (H[facility]) No date: Hypertension  Past Surgical History: No date: none No date: TONSILLECTOMY     Reproductive/Obstetrics negative OB ROS                            Anesthesia Physical  Anesthesia Plan  ASA: 3  Anesthesia Plan: General   Post-op Pain Management:    Induction: Intravenous  PONV Risk Score and Plan: 2 and Propofol infusion, TIVA and Treatment may vary due to age or medical condition  Airway Management Planned: Natural Airway and  Nasal Cannula  Additional Equipment:   Intra-op Plan:   Post-operative Plan:   Informed Consent: I have reviewed the patients History and Physical, chart, labs and discussed the procedure including the risks, benefits and alternatives for the proposed anesthesia with the patient or authorized representative who has indicated his/her understanding and acceptance.     Dental Advisory Given  Plan Discussed with: Anesthesiologist, CRNA and Surgeon  Anesthesia Plan Comments:        Anesthesia Quick Evaluation

## 2021-04-16 NOTE — Anesthesia Postprocedure Evaluation (Signed)
Anesthesia Post Note  Patient: Randy Deleon  Procedure(s) Performed: COLONOSCOPY WITH PROPOFOL  Patient location during evaluation: PACU Anesthesia Type: General Level of consciousness: awake and alert Pain management: pain level controlled Vital Signs Assessment: post-procedure vital signs reviewed and stable Respiratory status: spontaneous breathing, nonlabored ventilation and respiratory function stable Cardiovascular status: blood pressure returned to baseline and stable Postop Assessment: no apparent nausea or vomiting Anesthetic complications: no   No notable events documented.   Last Vitals:  Vitals:   04/16/21 1014 04/16/21 1200  BP: 126/70 132/88  Pulse:  (!) 56  Resp: 17 18  Temp: (!) 36.2 C   SpO2: 97% 97%    Last Pain:  Vitals:   04/16/21 1014  TempSrc:   PainSc: 0-No pain                 Foye Deer

## 2021-04-16 NOTE — Discharge Summary (Signed)
Physician Discharge Summary  Patient ID: Randy Deleon MRN: [id number] DOB/AGE: Mar 24, 1965 56 y.o.  Admit date: 04/15/2021 Discharge date: 04/16/2021  Admission Diagnoses: Hyperosmolar Hyperglycemic State d/t medication nonadherence   Discharge Diagnoses:  Principal Problem:   Hyperosmolar hyperglycemic state (HHS) (HCC) Active Problems:   Diabetes mellitus without complication (HCC)   Tobacco use   HLD (hyperlipidemia)   HTN (hypertension)   Polyp of colon   Discharged Condition: good  Hospital Course:  Randy Deleon is a 56 y.o. male with medical history significant of type 1 diabetes, hypertension, hyperlipidemia, tobacco abuse, who presents with polyuria, polydipsia, elevated blood sugar.   Patient is scheduled for screening colonoscopy by Dr. Timothy Lasso of GI today.  BMP showed that patient has elevated blood sugar 1007, bicarbonate of 23 and anion gap of 14.  Procedure is canceled and rescheduled for tomorrow.  Patient states that he has not been taking his diabetic medication for several days.  He reports polyuria and polydipsia.  No dysuria or burning on urination.  Patient does not have chest pain, cough, shortness breath.  No fever or chills.  No nausea vomiting, diarrhea or abdominal pain.   Patient is supposed to take Toujeo 100 units daily,  Novolog 3-20 units TID per SSI,  Metformin 1000 mg BID, Farxiga.  Invokana 300 mg daily is also on his medication list, but patient states that he is not taking.   Found to be in HHS< treated appropriately and resolved. Patient was eager to be d/c home after colonoscopy today. SInce he was stable off insulin gtt and Gls ok, was sent home on Semglee 40 units daily and novolog SS.    Consults: GI  Significant Diagnostic Studies: labs and imagng as below    Procedures SCREENING colonoscopy   Discharge Exam: Blood pressure 132/88, pulse (!) 56, temperature (!) 97.2 F (36.2 C), resp. rate 18, height 5\' 9"  (1.753 m), weight 100.1  kg, SpO2 97 %. General appearance: alert, cooperative, and no distress Resp: clear to auscultation bilaterally Cardio: regular rate and rhythm, S1, S2 normal, no murmur, click, rub or gallop GI: soft, non-tender; bowel sounds normal; no masses,  no organomegaly Skin: Skin color, texture, turgor normal. No rashes or lesions Neurologic: Grossly normal A&O x3  Disposition: Discharge disposition: 01-Home or Self Care       Discharge Instructions     Diet - low sodium heart healthy   Complete by: As directed    Discharge instructions   Complete by: As directed    Patient to follow up w/ PCP 1-2 weeks after discharge   Increase activity slowly   Complete by: As directed       Allergies as of 04/16/2021       Reactions   Contrast Media [iodinated Contrast Media]    Renal failure   Other Rash, Other (See Comments)   Tested allergic to LIMA BEANS   Shellfish Allergy Rash, Other (See Comments)   TESTED ALLERGIC TO "SHELLFISH"        Medication List     STOP taking these medications    insulin lispro 100 UNIT/ML KwikPen Commonly known as: HUMALOG   Invokana 300 MG Tabs tablet Generic drug: canagliflozin       TAKE these medications    acetaminophen 325 MG tablet Commonly known as: TYLENOL Take 2 tablets (650 mg total) by mouth every 6 (six) hours as needed for mild pain or fever.   amLODipine-benazepril 10-20 MG capsule Commonly known as: LOTREL Take  1 capsule by mouth every morning.   carvedilol 25 MG tablet Commonly known as: COREG Take 25 mg by mouth 2 (two) times daily.   dapagliflozin propanediol 10 MG Tabs tablet Commonly known as: FARXIGA Take by mouth daily.   fenofibrate 160 MG tablet Take 160 mg by mouth every morning.   gabapentin 300 MG capsule Commonly known as: NEURONTIN Take 300 mg by mouth 3 (three) times daily.   icosapent Ethyl 1 g capsule Commonly known as: VASCEPA Take 2 g by mouth 2 (two) times daily.   insulin aspart 100  UNIT/ML FlexPen Commonly known as: NOVOLOG Use TID with meals and bedtime: CBG 121-150: 3u; CBG 151-200: 4u; CBG 201-250: 7u; CBG 251-300: 11u; CBG 301- 350: 15u; CBG 351-400: 20u   metFORMIN 1000 MG tablet Commonly known as: GLUCOPHAGE Take 1,000 mg by mouth 2 (two) times daily.   pioglitazone 45 MG tablet Commonly known as: ACTOS Take 45 mg by mouth every morning.   rosuvastatin 40 MG tablet Commonly known as: CRESTOR Take 40 mg by mouth every evening.   sildenafil 100 MG tablet Commonly known as: VIAGRA Take 100 mg by mouth daily as needed.   Toujeo Max SoloStar 300 UNIT/ML Solostar Pen Generic drug: insulin glargine (2 Unit Dial) Inject 100 Units into the skin.   Vitamin D3 125 MCG (5000 UT) Caps Take 5,000 Units by mouth daily.       Results for orders placed or performed during the hospital encounter of 04/15/21 (from the past 72 hour(s))  Glucose, capillary     Status: Abnormal   Collection Time: 04/15/21  7:04 AM  Result Value Ref Range   Glucose-Capillary >600 (HH) 70 - 99 mg/dL    Comment: Glucose reference range applies only to samples taken after fasting for at least 8 hours.  Glucose, capillary     Status: Abnormal   Collection Time: 04/15/21  7:06 AM  Result Value Ref Range   Glucose-Capillary >600 (HH) 70 - 99 mg/dL    Comment: Glucose reference range applies only to samples taken after fasting for at least 8 hours.  Blood gas, venous     Status: Abnormal   Collection Time: 04/15/21  7:32 AM  Result Value Ref Range   pH, Ven 7.37 7.250 - 7.430   pCO2, Ven 45 44.0 - 60.0 mmHg   pO2, Ven 102.0 (H) 32.0 - 45.0 mmHg   Bicarbonate 26.0 20.0 - 28.0 mmol/L   Acid-Base Excess 0.3 0.0 - 2.0 mmol/L   O2 Saturation 97.7 %   Patient temperature 37.0    Collection site VEIN    Sample type VEIN     Comment: Performed at [facility], [address] [place], [place] [address]  Comprehensive metabolic panel     Status: Abnormal   Collection Time:  04/15/21  7:32 AM  Result Value Ref Range   Sodium 127 (L) 135 - 145 mmol/L   Potassium 4.5 3.5 - 5.1 mmol/L   Chloride 90 (L) 98 - 111 mmol/L   CO2 23 22 - 32 mmol/L   Glucose, Bld 1,007 (HH) 70 - 99 mg/dL    Comment: CRITICAL RESULT CALLED TO, READ BACK BY AND VERIFIED WITH JULIE JONES 04/15/21 0828 MW Glucose reference range applies only to samples taken after fasting for at least 8 hours.    BUN 10 6 - 20 mg/dL   Creatinine, Ser 0.94 0.61 - 1.24 mg/dL   Calcium 9.5 8.9 - 10.3 mg/dL   Total Protein  7.5 6.5 - 8.1 g/dL   Albumin 4.3 3.5 - 5.0 g/dL   AST 14 (L) 15 - 41 U/L   ALT 15 0 - 44 U/L   Alkaline Phosphatase 49 38 - 126 U/L   Total Bilirubin 1.0 0.3 - 1.2 mg/dL   GFR, Estimated >60 >60 mL/min    Comment: (NOTE) Calculated using the CKD-EPI Creatinine Equation (2021)    Anion gap 14 5 - 15    Comment: Performed at [facility], [address] [place], [place] [address]  Beta-hydroxybutyric acid     Status: Abnormal   Collection Time: 04/15/21  7:32 AM  Result Value Ref Range   Beta-Hydroxybutyric Acid 2.08 (H) 0.05 - 0.27 mmol/L    Comment: Performed at [facility], [address] [place], [place] [address]  Resp Panel by RT-PCR (Flu A&B, Covid) Nasopharyngeal Swab     Status: None   Collection Time: 04/15/21  9:13 AM   Specimen: Nasopharyngeal Swab; Nasopharyngeal(NP) swabs in vial transport medium  Result Value Ref Range   SARS Coronavirus 2 by RT PCR NEGATIVE NEGATIVE    Comment: (NOTE) SARS-CoV-2 target nucleic acids are NOT DETECTED.  The SARS-CoV-2 RNA is generally detectable in upper respiratory specimens during the acute phase of infection. The lowest concentration of SARS-CoV-2 viral copies this assay can detect is 138 copies/mL. A negative result does not preclude SARS-Cov-2 infection and should not be used as the sole basis for treatment or other patient management decisions. A negative result may occur with  improper specimen  collection/handling, submission of specimen other than nasopharyngeal swab, presence of viral mutation(s) within the areas targeted by this assay, and inadequate number of viral copies(<138 copies/mL). A negative result must be combined with clinical observations, patient history, and epidemiological information. The expected result is Negative.  Fact Sheet for Patients:  EntrepreneurPulse.com.au  Fact Sheet for Healthcare Providers:  IncredibleEmployment.be  This test is no t yet approved or cleared by the [place] FDA and  has been authorized for detection and/or diagnosis of SARS-CoV-2 by FDA under an Emergency Use Authorization (EUA). This EUA will remain  in effect (meaning this test can be used) for the duration of the COVID-19 declaration under Section 564(b)(1) of the Act, 21 U.S.C.section 360bbb-3(b)(1), unless the authorization is terminated  or revoked sooner.       Influenza A by PCR NEGATIVE NEGATIVE   Influenza B by PCR NEGATIVE NEGATIVE    Comment: (NOTE) The Xpert Xpress SARS-CoV-2/FLU/RSV plus assay is intended as an aid in the diagnosis of influenza from Nasopharyngeal swab specimens and should not be used as a sole basis for treatment. Nasal washings and aspirates are unacceptable for Xpert Xpress SARS-CoV-2/FLU/RSV testing.  Fact Sheet for Patients: EntrepreneurPulse.com.au  Fact Sheet for Healthcare Providers: IncredibleEmployment.be  This test is not yet approved or cleared by the [place] FDA and has been authorized for detection and/or diagnosis of SARS-CoV-2 by FDA under an Emergency Use Authorization (EUA). This EUA will remain in effect (meaning this test can be used) for the duration of the COVID-19 declaration under Section 564(b)(1) of the Act, 21 U.S.C. section 360bbb-3(b)(1), unless the authorization is terminated or revoked.  Performed at [facility], [address] [place], [place] [address]   HIV Antibody (routine testing w rflx)     Status: None   Collection Time: 04/15/21  9:59 AM  Result Value Ref Range   HIV Screen 4th Generation wRfx Non Reactive Non Reactive  Comment: Performed at [facility], [place] [address] [place], [place] [id number]  Basic metabolic panel     Status: Abnormal   Collection Time: 04/15/21  9:59 AM  Result Value Ref Range   Sodium 129 (L) 135 - 145 mmol/L   Potassium 4.8 3.5 - 5.1 mmol/L   Chloride 88 (L) 98 - 111 mmol/L   CO2 29 22 - 32 mmol/L   Glucose, Bld 658 (HH) 70 - 99 mg/dL    Comment: CRITICAL RESULT CALLED TO, READ BACK BY AND VERIFIED WITH KATY CLAYTON AT 1123 04/15/21.PMF Glucose reference range applies only to samples taken after fasting for at least 8 hours.    BUN 10 6 - 20 mg/dL   Creatinine, Ser 0.83 0.61 - 1.24 mg/dL   Calcium 10.3 8.9 - 10.3 mg/dL   GFR, Estimated >60 >60 mL/min    Comment: (NOTE) Calculated using the CKD-EPI Creatinine Equation (2021)    Anion gap 12 5 - 15    Comment: Performed at [facility], [address] [place], [place] [address]  Osmolality     Status: Abnormal   Collection Time: 04/15/21  9:59 AM  Result Value Ref Range   Osmolality 315 (H) 275 - 295 mOsm/kg    Comment: RESULT REPEATED AND VERIFIED Performed at [facility], [address] [place], [place] [address]   CBC     Status: Abnormal   Collection Time: 04/15/21  9:59 AM  Result Value Ref Range   WBC 5.7 4.0 - 10.5 K/uL   RBC 4.55 4.22 - 5.81 MIL/uL   Hemoglobin 15.2 13.0 - 17.0 g/dL   HCT 45.8 39.0 - 52.0 %   MCV 100.7 (H) 80.0 - 100.0 fL   MCH 33.4 26.0 - 34.0 pg   MCHC 33.2 30.0 - 36.0 g/dL   RDW 13.0 11.5 - 15.5 %   Platelets 135 (L) 150 - 400 K/uL   nRBC 0.0 0.0 - 0.2 %    Comment: Performed at [facility], [address] [place], [place] [address]  Glucose, capillary     Status: Abnormal   Collection Time: 04/15/21 10:30 AM  Result  Value Ref Range   Glucose-Capillary >600 (HH) 70 - 99 mg/dL    Comment: Glucose reference range applies only to samples taken after fasting for at least 8 hours.  MRSA Next Gen by PCR, Nasal     Status: None   Collection Time: 04/15/21 10:30 AM   Specimen: Nasal Mucosa; Nasal Swab  Result Value Ref Range   MRSA by PCR Next Gen NOT DETECTED NOT DETECTED    Comment: (NOTE) The GeneXpert MRSA Assay (FDA approved for NASAL specimens only), is one component of a comprehensive MRSA colonization surveillance program. It is not intended to diagnose MRSA infection nor to guide or monitor treatment for MRSA infections. Test performance is not FDA approved in patients less than 27 years old. Performed at [facility], [address] [place], [place] [address]   Glucose, capillary     Status: Abnormal   Collection Time: 04/15/21 11:12 AM  Result Value Ref Range   Glucose-Capillary 534 (HH) 70 - 99 mg/dL    Comment: Glucose reference range applies only to samples taken after fasting for at least 8 hours.   Comment 1 Notify RN   Glucose, capillary     Status: Abnormal   Collection Time: 04/15/21 11:49 AM  Result Value Ref Range   Glucose-Capillary 474 (H) 70 -  99 mg/dL    Comment: Glucose reference range applies only to samples taken after fasting for at least 8 hours.  Glucose, capillary     Status: Abnormal   Collection Time: 04/15/21 12:37 PM  Result Value Ref Range   Glucose-Capillary 346 (H) 70 - 99 mg/dL    Comment: Glucose reference range applies only to samples taken after fasting for at least 8 hours.  Basic metabolic panel     Status: Abnormal   Collection Time: 04/15/21  1:34 PM  Result Value Ref Range   Sodium 132 (L) 135 - 145 mmol/L   Potassium 3.8 3.5 - 5.1 mmol/L   Chloride 96 (L) 98 - 111 mmol/L   CO2 29 22 - 32 mmol/L   Glucose, Bld 299 (H) 70 - 99 mg/dL    Comment: Glucose reference range applies only to samples taken after fasting for at least 8 hours.   BUN  8 6 - 20 mg/dL   Creatinine, Ser 0.72 0.61 - 1.24 mg/dL   Calcium 9.6 8.9 - 10.3 mg/dL   GFR, Estimated >60 >60 mL/min    Comment: (NOTE) Calculated using the CKD-EPI Creatinine Equation (2021)    Anion gap 7 5 - 15    Comment: Performed at [facility], [address] [place], [place] [address]  Glucose, capillary     Status: Abnormal   Collection Time: 04/15/21  1:38 PM  Result Value Ref Range   Glucose-Capillary 293 (H) 70 - 99 mg/dL    Comment: Glucose reference range applies only to samples taken after fasting for at least 8 hours.  Glucose, capillary     Status: Abnormal   Collection Time: 04/15/21  2:46 PM  Result Value Ref Range   Glucose-Capillary 200 (H) 70 - 99 mg/dL    Comment: Glucose reference range applies only to samples taken after fasting for at least 8 hours.  Glucose, capillary     Status: Abnormal   Collection Time: 04/15/21  3:54 PM  Result Value Ref Range   Glucose-Capillary 135 (H) 70 - 99 mg/dL    Comment: Glucose reference range applies only to samples taken after fasting for at least 8 hours.  Glucose, capillary     Status: Abnormal   Collection Time: 04/15/21  4:59 PM  Result Value Ref Range   Glucose-Capillary 190 (H) 70 - 99 mg/dL    Comment: Glucose reference range applies only to samples taken after fasting for at least 8 hours.  Basic metabolic panel     Status: Abnormal   Collection Time: 04/15/21  5:38 PM  Result Value Ref Range   Sodium 134 (L) 135 - 145 mmol/L   Potassium 3.4 (L) 3.5 - 5.1 mmol/L   Chloride 98 98 - 111 mmol/L   CO2 28 22 - 32 mmol/L   Glucose, Bld 265 (H) 70 - 99 mg/dL    Comment: Glucose reference range applies only to samples taken after fasting for at least 8 hours.   BUN 8 6 - 20 mg/dL   Creatinine, Ser 0.59 (L) 0.61 - 1.24 mg/dL   Calcium 9.4 8.9 - 10.3 mg/dL   GFR, Estimated >60 >60 mL/min    Comment: (NOTE) Calculated using the CKD-EPI Creatinine Equation (2021)    Anion gap 8 5 - 15    Comment:  Performed at [facility], [address] [place], [place] [address]  Glucose, capillary     Status: Abnormal   Collection Time: 04/15/21  6:02 PM  Result Value Ref Range   Glucose-Capillary 254 (H) 70 - 99 mg/dL    Comment: Glucose reference range applies only to samples taken after fasting for at least 8 hours.  Glucose, capillary     Status: Abnormal   Collection Time: 04/15/21  7:29 PM  Result Value Ref Range   Glucose-Capillary 165 (H) 70 - 99 mg/dL    Comment: Glucose reference range applies only to samples taken after fasting for at least 8 hours.  Glucose, capillary     Status: Abnormal   Collection Time: 04/15/21  9:03 PM  Result Value Ref Range   Glucose-Capillary 103 (H) 70 - 99 mg/dL    Comment: Glucose reference range applies only to samples taken after fasting for at least 8 hours.  Glucose, capillary     Status: Abnormal   Collection Time: 04/15/21 10:00 PM  Result Value Ref Range   Glucose-Capillary 118 (H) 70 - 99 mg/dL    Comment: Glucose reference range applies only to samples taken after fasting for at least 8 hours.  Basic metabolic panel     Status: Abnormal   Collection Time: 04/15/21 10:47 PM  Result Value Ref Range   Sodium 137 135 - 145 mmol/L   Potassium 3.5 3.5 - 5.1 mmol/L   Chloride 98 98 - 111 mmol/L   CO2 30 22 - 32 mmol/L   Glucose, Bld 202 (H) 70 - 99 mg/dL    Comment: Glucose reference range applies only to samples taken after fasting for at least 8 hours.   BUN 6 6 - 20 mg/dL   Creatinine, Ser 0.68 0.61 - 1.24 mg/dL   Calcium 9.2 8.9 - 10.3 mg/dL   GFR, Estimated >60 >60 mL/min    Comment: (NOTE) Calculated using the CKD-EPI Creatinine Equation (2021)    Anion gap 9 5 - 15    Comment: Performed at [facility], [address] [place], [place] [address]  Glucose, capillary     Status: Abnormal   Collection Time: 04/15/21 10:58 PM  Result Value Ref Range   Glucose-Capillary 123 (H) 70 - 99 mg/dL    Comment: Glucose  reference range applies only to samples taken after fasting for at least 8 hours.  Glucose, capillary     Status: Abnormal   Collection Time: 04/16/21 12:00 AM  Result Value Ref Range   Glucose-Capillary 149 (H) 70 - 99 mg/dL    Comment: Glucose reference range applies only to samples taken after fasting for at least 8 hours.  Glucose, capillary     Status: Abnormal   Collection Time: 04/16/21  1:04 AM  Result Value Ref Range   Glucose-Capillary 157 (H) 70 - 99 mg/dL    Comment: Glucose reference range applies only to samples taken after fasting for at least 8 hours.  Basic metabolic panel     Status: Abnormal   Collection Time: 04/16/21  2:00 AM  Result Value Ref Range   Sodium 136 135 - 145 mmol/L   Potassium 4.0 3.5 - 5.1 mmol/L   Chloride 100 98 - 111 mmol/L   CO2 28 22 - 32 mmol/L   Glucose, Bld 184 (H) 70 - 99 mg/dL    Comment: Glucose reference range applies only to samples taken after fasting for at least 8 hours.   BUN 6 6 - 20 mg/dL   Creatinine, Ser 0.57 (L) 0.61 - 1.24 mg/dL   Calcium 9.2 8.9 - 10.3 mg/dL   GFR, Estimated >60 >60 mL/min  Comment: (NOTE) Calculated using the CKD-EPI Creatinine Equation (2021)    Anion gap 8 5 - 15    Comment: Performed at [facility], [address] [place], [place] [address]  Glucose, capillary     Status: Abnormal   Collection Time: 04/16/21  2:10 AM  Result Value Ref Range   Glucose-Capillary 182 (H) 70 - 99 mg/dL    Comment: Glucose reference range applies only to samples taken after fasting for at least 8 hours.  Glucose, capillary     Status: Abnormal   Collection Time: 04/16/21  3:04 AM  Result Value Ref Range   Glucose-Capillary 221 (H) 70 - 99 mg/dL    Comment: Glucose reference range applies only to samples taken after fasting for at least 8 hours.  Glucose, capillary     Status: Abnormal   Collection Time: 04/16/21  4:08 AM  Result Value Ref Range   Glucose-Capillary 228 (H) 70 - 99 mg/dL    Comment:  Glucose reference range applies only to samples taken after fasting for at least 8 hours.  Glucose, capillary     Status: Abnormal   Collection Time: 04/16/21  5:00 AM  Result Value Ref Range   Glucose-Capillary 226 (H) 70 - 99 mg/dL    Comment: Glucose reference range applies only to samples taken after fasting for at least 8 hours.  Glucose, capillary     Status: Abnormal   Collection Time: 04/16/21  6:17 AM  Result Value Ref Range   Glucose-Capillary 189 (H) 70 - 99 mg/dL    Comment: Glucose reference range applies only to samples taken after fasting for at least 8 hours.  Glucose, capillary     Status: Abnormal   Collection Time: 04/16/21  7:07 AM  Result Value Ref Range   Glucose-Capillary 198 (H) 70 - 99 mg/dL    Comment: Glucose reference range applies only to samples taken after fasting for at least 8 hours.  Basic metabolic panel     Status: Abnormal   Collection Time: 04/16/21  7:23 AM  Result Value Ref Range   Sodium 137 135 - 145 mmol/L   Potassium 3.9 3.5 - 5.1 mmol/L   Chloride 102 98 - 111 mmol/L   CO2 27 22 - 32 mmol/L   Glucose, Bld 151 (H) 70 - 99 mg/dL    Comment: Glucose reference range applies only to samples taken after fasting for at least 8 hours.   BUN <5 (L) 6 - 20 mg/dL   Creatinine, Ser 0.52 (L) 0.61 - 1.24 mg/dL   Calcium 9.5 8.9 - 10.3 mg/dL   GFR, Estimated >60 >60 mL/min    Comment: (NOTE) Calculated using the CKD-EPI Creatinine Equation (2021)    Anion gap 8 5 - 15    Comment: Performed at [facility], [address] [place], [place] [address]  Glucose, capillary     Status: Abnormal   Collection Time: 04/16/21  8:27 AM  Result Value Ref Range   Glucose-Capillary 138 (H) 70 - 99 mg/dL    Comment: Glucose reference range applies only to samples taken after fasting for at least 8 hours.  Basic metabolic panel     Status: Abnormal   Collection Time: 04/16/21 10:28 AM  Result Value Ref Range   Sodium 138 135 - 145 mmol/L   Potassium  3.6 3.5 - 5.1 mmol/L   Chloride 105 98 - 111 mmol/L   CO2 27 22 - 32 mmol/L   Glucose, Bld 95  70 - 99 mg/dL    Comment: Glucose reference range applies only to samples taken after fasting for at least 8 hours.   BUN <5 (L) 6 - 20 mg/dL   Creatinine, Ser 0.50 (L) 0.61 - 1.24 mg/dL   Calcium 8.8 (L) 8.9 - 10.3 mg/dL   GFR, Estimated >60 >60 mL/min    Comment: (NOTE) Calculated using the CKD-EPI Creatinine Equation (2021)    Anion gap 6 5 - 15    Comment: Performed at [facility], [address] [place], [place] [address]  Glucose, capillary     Status: None   Collection Time: 04/16/21 11:06 AM  Result Value Ref Range   Glucose-Capillary 98 70 - 99 mg/dL    Comment: Glucose reference range applies only to samples taken after fasting for at least 8 hours.  Glucose, capillary     Status: Abnormal   Collection Time: 04/16/21 12:29 PM  Result Value Ref Range   Glucose-Capillary 127 (H) 70 - 99 mg/dL    Comment: Glucose reference range applies only to samples taken after fasting for at least 8 hours.   No results found.  Colonoscopy 04/16/21 Findings:      The perianal and digital rectal examinations were normal. Pertinent       negatives include normal sphincter tone and no palpable rectal lesions.      Five sessile polyps were found in the rectum and sigmoid colon. The       polyps were 3 to 4 mm in size. These polyps were removed with a cold       snare. Resection and retrieval were complete. Estimated blood loss: none.      The retroflexed view of the distal rectum and anal verge was normal and       showed no anal or rectal abnormalities. Impression:            - Five 3 to 4 mm polyps in the rectum and in the                         sigmoid colon, removed with a cold snare. Resected and                         retrieved.                        - The distal rectum and anal verge are normal on                         retroflexion view. Recommendation:        -  Discharge patient to home (with escort).                        - Resume previous diet today.                        - Continue present medications.                        - Await pathology results.                        - Repeat colonoscopy in 5 years for surveillance.    Signed: Emeterio Reeve 04/16/2021, 7:05 PM

## 2021-04-16 NOTE — Progress Notes (Signed)
New IV patent all fluids infused without any additional difficulties. CHG bath completed at this time. Call bell kept within reach.

## 2021-04-16 NOTE — Progress Notes (Signed)
Inpatient Diabetes Program Recommendations  AACE/ADA: New Consensus Statement on Inpatient Glycemic Control (2015)  Target Ranges:  Prepandial:   less than 140 mg/dL      Peak postprandial:   less than 180 mg/dL (1-2 hours)      Critically ill patients:  140 - 180 mg/dL   Lab Results  Component Value Date   GLUCAP 198 (H) 04/16/2021   HGBA1C 9.2 (H) 06/28/2019    Review of Glycemic Control  Latest Reference Range & Units 04/16/21 05:00 04/16/21 06:17 04/16/21 07:07  Glucose-Capillary 70 - 99 mg/dL 474 (H) 259 (H) 563 (H)  History: DM   Home DM Meds: Toujeo 100 units daily    Novolog 3-20 units TID per SSI    Metformin 1000 mg BID   Invokana 300 mg daily   (Farxiga also listed)    Current Orders: IV Insulin Drip  Inpatient Diabetes Program Recommendations:    Note patient is having procedure today. For transition off insulin drip, consider Semglee 40 units 2 hours prior to d/c of insulin drip.  Per discussion with DM coordinator on 1/27, patient was not taking insulin consistently at home so would probably be best to resume lower dose.  Also consider Novolog moderate q 4 hours.    Thanks,  Beryl Meager, RN, BC-ADM Inpatient Diabetes Coordinator Pager [phone]  (8a-5p)

## 2021-04-16 NOTE — Transfer of Care (Signed)
Immediate Anesthesia Transfer of Care Note  Patient: Randy Deleon  Procedure(s) Performed: Procedure(s): COLONOSCOPY WITH PROPOFOL (N/A)  Patient Location: PACU  Anesthesia Type:General  Level of Consciousness: sedated  Airway & Oxygen Therapy: Patient Spontanous Breathing and Patient connected to face mask oxygen  Post-op Assessment: Report given to RN and Post -op Vital signs reviewed and stable  Post vital signs: Reviewed and stable  Last Vitals:  Vitals:   04/16/21 0910 04/16/21 0956  BP: 126/80 114/66  Pulse: 63   Resp: 20 18  Temp: 36.8 C (!) 36.3 C  SpO2: 123XX123 123XX123    Complications: No apparent anesthesia complications

## 2021-04-16 NOTE — Progress Notes (Signed)
Discharge instructions given to patient and verbalizes understanding. Patient with no complaints at the current time. Patient take out via WC.

## 2021-04-18 ENCOUNTER — Encounter: Payer: Self-pay | Admitting: Gastroenterology

## 2021-04-18 LAB — GLUCOSE, CAPILLARY: Glucose-Capillary: 107 mg/dL — ABNORMAL HIGH (ref 70–99)

## 2021-04-19 ENCOUNTER — Encounter: Payer: Self-pay | Admitting: Gastroenterology

## 2021-04-19 LAB — SURGICAL PATHOLOGY

## 2021-04-20 LAB — HEMOGLOBIN A1C
Hgb A1c MFr Bld: 13.6 % — ABNORMAL HIGH (ref 4.8–5.6)
Mean Plasma Glucose: 344 mg/dL

## 2021-12-18 ENCOUNTER — Encounter: Payer: Self-pay | Admitting: Internal Medicine

## 2021-12-18 ENCOUNTER — Other Ambulatory Visit: Payer: Self-pay

## 2021-12-18 ENCOUNTER — Emergency Department: Payer: BC Managed Care – PPO

## 2021-12-18 ENCOUNTER — Inpatient Hospital Stay
Admission: EM | Admit: 2021-12-18 | Discharge: 2021-12-21 | DRG: 638 | Disposition: A | Discharge status: Home / Self Care | Payer: BC Managed Care – PPO | Attending: Internal Medicine | Admitting: Internal Medicine

## 2021-12-18 DIAGNOSIS — Z79899 Other long term (current) drug therapy: Secondary | ICD-10-CM | POA: Diagnosis not present

## 2021-12-18 DIAGNOSIS — E1110 Type 2 diabetes mellitus with ketoacidosis without coma: Principal | ICD-10-CM | POA: Diagnosis present

## 2021-12-18 DIAGNOSIS — F17210 Nicotine dependence, cigarettes, uncomplicated: Secondary | ICD-10-CM | POA: Diagnosis present

## 2021-12-18 DIAGNOSIS — R531 Weakness: Secondary | ICD-10-CM | POA: Diagnosis not present

## 2021-12-18 DIAGNOSIS — Z794 Long term (current) use of insulin: Secondary | ICD-10-CM

## 2021-12-18 DIAGNOSIS — G8929 Other chronic pain: Secondary | ICD-10-CM | POA: Diagnosis present

## 2021-12-18 DIAGNOSIS — Z1152 Encounter for screening for COVID-19: Secondary | ICD-10-CM | POA: Diagnosis not present

## 2021-12-18 DIAGNOSIS — Z7984 Long term (current) use of oral hypoglycemic drugs: Secondary | ICD-10-CM | POA: Diagnosis not present

## 2021-12-18 DIAGNOSIS — E1010 Type 1 diabetes mellitus with ketoacidosis without coma: Secondary | ICD-10-CM | POA: Diagnosis not present

## 2021-12-18 DIAGNOSIS — D7589 Other specified diseases of blood and blood-forming organs: Secondary | ICD-10-CM | POA: Diagnosis present

## 2021-12-18 DIAGNOSIS — T383X6A Underdosing of insulin and oral hypoglycemic [antidiabetic] drugs, initial encounter: Secondary | ICD-10-CM | POA: Diagnosis present

## 2021-12-18 DIAGNOSIS — M549 Dorsalgia, unspecified: Secondary | ICD-10-CM | POA: Diagnosis present

## 2021-12-18 DIAGNOSIS — Z91041 Radiographic dye allergy status: Secondary | ICD-10-CM

## 2021-12-18 DIAGNOSIS — Z91013 Allergy to seafood: Secondary | ICD-10-CM

## 2021-12-18 DIAGNOSIS — E860 Dehydration: Secondary | ICD-10-CM

## 2021-12-18 DIAGNOSIS — Z91128 Patient's intentional underdosing of medication regimen for other reason: Secondary | ICD-10-CM

## 2021-12-18 DIAGNOSIS — N179 Acute kidney failure, unspecified: Secondary | ICD-10-CM | POA: Diagnosis present

## 2021-12-18 DIAGNOSIS — Z8249 Family history of ischemic heart disease and other diseases of the circulatory system: Secondary | ICD-10-CM | POA: Diagnosis not present

## 2021-12-18 DIAGNOSIS — Z833 Family history of diabetes mellitus: Secondary | ICD-10-CM

## 2021-12-18 DIAGNOSIS — I10 Essential (primary) hypertension: Secondary | ICD-10-CM | POA: Diagnosis present

## 2021-12-18 LAB — BASIC METABOLIC PANEL
Anion gap: 25 — ABNORMAL HIGH (ref 5–15)
Anion gap: 18 — ABNORMAL HIGH (ref 5–15)
Anion gap: 12 (ref 5–15)
BUN: 39 mg/dL — ABNORMAL HIGH (ref 6–20)
BUN: 34 mg/dL — ABNORMAL HIGH (ref 6–20)
BUN: 28 mg/dL — ABNORMAL HIGH (ref 6–20)
CO2: 11 mmol/L — ABNORMAL LOW (ref 22–32)
CO2: 17 mmol/L — ABNORMAL LOW (ref 22–32)
CO2: 24 mmol/L (ref 22–32)
Calcium: 9.1 mg/dL (ref 8.9–10.3)
Calcium: 9.6 mg/dL (ref 8.9–10.3)
Calcium: 9.7 mg/dL (ref 8.9–10.3)
Chloride: 100 mmol/L (ref 98–111)
Chloride: 104 mmol/L (ref 98–111)
Chloride: 104 mmol/L (ref 98–111)
Creatinine, Ser: 1.5 mg/dL — ABNORMAL HIGH (ref 0.61–1.24)
Creatinine, Ser: 1.51 mg/dL — ABNORMAL HIGH (ref 0.61–1.24)
Creatinine, Ser: 1.27 mg/dL — ABNORMAL HIGH (ref 0.61–1.24)
GFR, Estimated: 55 mL/min — ABNORMAL LOW (ref >60)
GFR, Estimated: 55 mL/min — ABNORMAL LOW (ref >60)
GFR, Estimated: >60 mL/min (ref >60)
Glucose, Bld: 379 mg/dL — ABNORMAL HIGH (ref 70–99)
Glucose, Bld: 233 mg/dL — ABNORMAL HIGH (ref 70–99)
Glucose, Bld: 171 mg/dL — ABNORMAL HIGH (ref 70–99)
Potassium: 4 mmol/L (ref 3.5–5.1)
Potassium: 3.9 mmol/L (ref 3.5–5.1)
Potassium: 3.6 mmol/L (ref 3.5–5.1)
Sodium: 136 mmol/L (ref 135–145)
Sodium: 139 mmol/L (ref 135–145)
Sodium: 140 mmol/L (ref 135–145)

## 2021-12-18 LAB — BLOOD GAS, VENOUS
Acid-base deficit: 18.9 mmol/L — ABNORMAL HIGH (ref 0.0–2.0)
Bicarbonate: 9.6 mmol/L — ABNORMAL LOW (ref 20.0–28.0)
O2 Saturation: 66.8 %
Patient temperature: 37
pCO2, Ven: 31 mmHg — ABNORMAL LOW (ref 44–60)
pH, Ven: 7.1 — CL (ref 7.25–7.43)
pO2, Ven: 43 mmHg (ref 32–45)

## 2021-12-18 LAB — URINALYSIS, ROUTINE W REFLEX MICROSCOPIC
Bacteria, UA: NONE SEEN
Bilirubin Urine: NEGATIVE
Glucose, UA: >=500 mg/dL — AB
Hgb urine dipstick: NEGATIVE
Ketones, ur: 80 mg/dL — AB
Leukocytes,Ua: NEGATIVE
Nitrite: NEGATIVE
Protein, ur: NEGATIVE mg/dL
Specific Gravity, Urine: 1.018 (ref 1.005–1.030)
Squamous Epithelial / HPF: NONE SEEN (ref 0–5)
pH: 5 (ref 5.0–8.0)

## 2021-12-18 LAB — CBC
HCT: 47.4 % (ref 39.0–52.0)
HCT: 44.4 % (ref 39.0–52.0)
Hemoglobin: 15.5 g/dL (ref 13.0–17.0)
Hemoglobin: 14.7 g/dL (ref 13.0–17.0)
MCH: 33 pg (ref 26.0–34.0)
MCH: 32.9 pg (ref 26.0–34.0)
MCHC: 32.7 g/dL (ref 30.0–36.0)
MCHC: 33.1 g/dL (ref 30.0–36.0)
MCV: 100.9 fL — ABNORMAL HIGH (ref 80.0–100.0)
MCV: 99.3 fL (ref 80.0–100.0)
Platelets: 194 10*3/uL (ref 150–400)
Platelets: 165 10*3/uL (ref 150–400)
RBC: 4.7 MIL/uL (ref 4.22–5.81)
RBC: 4.47 MIL/uL (ref 4.22–5.81)
RDW: 12.5 % (ref 11.5–15.5)
RDW: 12.5 % (ref 11.5–15.5)
WBC: 10 10*3/uL (ref 4.0–10.5)
WBC: 9.1 10*3/uL (ref 4.0–10.5)
nRBC: 0 % (ref 0.0–0.2)
nRBC: 0 % (ref 0.0–0.2)

## 2021-12-18 LAB — COMPREHENSIVE METABOLIC PANEL
ALT: 12 U/L (ref 0–44)
AST: 12 U/L — ABNORMAL LOW (ref 15–41)
Albumin: 4.5 g/dL (ref 3.5–5.0)
Alkaline Phosphatase: 51 U/L (ref 38–126)
Anion gap: 31 — ABNORMAL HIGH (ref 5–15)
BUN: 45 mg/dL — ABNORMAL HIGH (ref 6–20)
CO2: 9 mmol/L — ABNORMAL LOW (ref 22–32)
Calcium: 9.4 mg/dL (ref 8.9–10.3)
Chloride: 91 mmol/L — ABNORMAL LOW (ref 98–111)
Creatinine, Ser: 1.96 mg/dL — ABNORMAL HIGH (ref 0.61–1.24)
GFR, Estimated: 40 mL/min — ABNORMAL LOW (ref >60)
Glucose, Bld: 573 mg/dL (ref 70–99)
Potassium: 5.2 mmol/L — ABNORMAL HIGH (ref 3.5–5.1)
Sodium: 131 mmol/L — ABNORMAL LOW (ref 135–145)
Total Bilirubin: 3.3 mg/dL — ABNORMAL HIGH (ref 0.3–1.2)
Total Protein: 8.2 g/dL — ABNORMAL HIGH (ref 6.5–8.1)

## 2021-12-18 LAB — GLUCOSE, CAPILLARY
Glucose-Capillary: 151 mg/dL — ABNORMAL HIGH (ref 70–99)
Glucose-Capillary: 159 mg/dL — ABNORMAL HIGH (ref 70–99)
Glucose-Capillary: 161 mg/dL — ABNORMAL HIGH (ref 70–99)
Glucose-Capillary: 164 mg/dL — ABNORMAL HIGH (ref 70–99)
Glucose-Capillary: 167 mg/dL — ABNORMAL HIGH (ref 70–99)
Glucose-Capillary: 179 mg/dL — ABNORMAL HIGH (ref 70–99)
Glucose-Capillary: 186 mg/dL — ABNORMAL HIGH (ref 70–99)
Glucose-Capillary: 189 mg/dL — ABNORMAL HIGH (ref 70–99)
Glucose-Capillary: 206 mg/dL — ABNORMAL HIGH (ref 70–99)
Glucose-Capillary: 215 mg/dL — ABNORMAL HIGH (ref 70–99)
Glucose-Capillary: 240 mg/dL — ABNORMAL HIGH (ref 70–99)
Glucose-Capillary: 242 mg/dL — ABNORMAL HIGH (ref 70–99)
Glucose-Capillary: 244 mg/dL — ABNORMAL HIGH (ref 70–99)
Glucose-Capillary: 249 mg/dL — ABNORMAL HIGH (ref 70–99)
Glucose-Capillary: 324 mg/dL — ABNORMAL HIGH (ref 70–99)
Glucose-Capillary: 350 mg/dL — ABNORMAL HIGH (ref 70–99)
Glucose-Capillary: 356 mg/dL — ABNORMAL HIGH (ref 70–99)

## 2021-12-18 LAB — CBG MONITORING, ED
Glucose-Capillary: 519 mg/dL (ref 70–99)
Glucose-Capillary: 386 mg/dL — ABNORMAL HIGH (ref 70–99)
Glucose-Capillary: 408 mg/dL — ABNORMAL HIGH (ref 70–99)

## 2021-12-18 LAB — SARS CORONAVIRUS 2 BY RT PCR: SARS Coronavirus 2 by RT PCR: NEGATIVE

## 2021-12-18 LAB — MAGNESIUM: Magnesium: 1.2 mg/dL — ABNORMAL LOW (ref 1.7–2.4)

## 2021-12-18 LAB — BETA-HYDROXYBUTYRIC ACID
Beta-Hydroxybutyric Acid: 0.21 mmol/L (ref 0.05–0.27)
Beta-Hydroxybutyric Acid: >8 mmol/L — ABNORMAL HIGH (ref 0.05–0.27)
Beta-Hydroxybutyric Acid: 5.91 mmol/L — ABNORMAL HIGH (ref 0.05–0.27)
Beta-Hydroxybutyric Acid: 3.55 mmol/L — ABNORMAL HIGH (ref 0.05–0.27)

## 2021-12-18 LAB — HEMOGLOBIN A1C
Hgb A1c MFr Bld: 12 % — ABNORMAL HIGH (ref 4.8–5.6)
Mean Plasma Glucose: 297.7 mg/dL

## 2021-12-18 LAB — TROPONIN I (HIGH SENSITIVITY)
Troponin I (High Sensitivity): 5 ng/L (ref <18)
Troponin I (High Sensitivity): 5 ng/L (ref <18)

## 2021-12-18 LAB — MRSA NEXT GEN BY PCR, NASAL: MRSA by PCR Next Gen: NOT DETECTED

## 2021-12-18 LAB — LIPASE, BLOOD: Lipase: 123 U/L — ABNORMAL HIGH (ref 11–51)

## 2021-12-18 MED ORDER — DEXTROSE 50 % IV SOLN: 0.0000 mL | INTRAVENOUS | Status: DC | PRN

## 2021-12-18 MED ORDER — ONDANSETRON HCL 4 MG/2ML IJ SOLN: 4.0000 mg | Freq: Three times a day (TID) | INTRAMUSCULAR | Status: DC | PRN

## 2021-12-18 MED ORDER — SODIUM CHLORIDE 0.9 % IV SOLN
12.5000 mg | Freq: Four times a day (QID) | INTRAVENOUS | Status: DC | PRN
  Administered 2021-12-18: 12.5 mg via INTRAVENOUS
  Filled 2021-12-18: qty 0.5

## 2021-12-18 MED ORDER — SODIUM CHLORIDE 0.9 % IV SOLN
8.0000 mg | Freq: Once | INTRAVENOUS | Status: DC
  Filled 2021-12-18: qty 4

## 2021-12-18 MED ORDER — THIAMINE MONONITRATE 100 MG PO TABS
100.0000 mg | ORAL_TABLET | Freq: Every day | ORAL | Status: DC
  Administered 2021-12-20: 100 mg via ORAL
  Filled 2021-12-18: qty 1

## 2021-12-18 MED ORDER — FOLIC ACID 1 MG PO TABS
1.0000 mg | ORAL_TABLET | Freq: Every day | ORAL | Status: DC
  Administered 2021-12-20: 1 mg via ORAL
  Filled 2021-12-18: qty 1

## 2021-12-18 MED ORDER — DEXTROSE IN LACTATED RINGERS 5 % IV SOLN: INTRAVENOUS | Status: DC

## 2021-12-18 MED ORDER — CHLORHEXIDINE GLUCONATE CLOTH 2 % EX PADS
6.0000 | MEDICATED_PAD | Freq: Every day | CUTANEOUS | Status: DC
  Administered 2021-12-18 – 2021-12-21 (×2): 6 via TOPICAL

## 2021-12-18 MED ORDER — LORAZEPAM 2 MG/ML IJ SOLN: 1.0000 mg | INTRAMUSCULAR | Status: DC | PRN

## 2021-12-18 MED ORDER — ENOXAPARIN SODIUM 60 MG/0.6ML IJ SOSY: 0.5000 mg/kg | PREFILLED_SYRINGE | INTRAMUSCULAR | Status: DC

## 2021-12-18 MED ORDER — HYDRALAZINE HCL 20 MG/ML IJ SOLN: 5.0000 mg | INTRAMUSCULAR | Status: DC | PRN

## 2021-12-18 MED ORDER — THIAMINE HCL 100 MG/ML IJ SOLN
100.0000 mg | Freq: Every day | INTRAMUSCULAR | Status: DC
  Administered 2021-12-18 – 2021-12-19 (×2): 100 mg via INTRAVENOUS
  Filled 2021-12-18 (×2): qty 2

## 2021-12-18 MED ORDER — PANTOPRAZOLE SODIUM 40 MG IV SOLR
40.0000 mg | Freq: Two times a day (BID) | INTRAVENOUS | Status: DC
  Administered 2021-12-18 – 2021-12-20 (×6): 40 mg via INTRAVENOUS
  Filled 2021-12-18 (×6): qty 10

## 2021-12-18 MED ORDER — INSULIN REGULAR(HUMAN) IN NACL 100-0.9 UT/100ML-% IV SOLN
INTRAVENOUS | Status: DC
  Administered 2021-12-18: 5 [IU]/h via INTRAVENOUS
  Administered 2021-12-18: 13 [IU]/h via INTRAVENOUS
  Administered 2021-12-18 – 2021-12-19 (×2): 9 [IU]/h via INTRAVENOUS
  Filled 2021-12-18 (×3): qty 100

## 2021-12-18 MED ORDER — LORAZEPAM 2 MG/ML IJ SOLN: 0.0000 mg | INTRAMUSCULAR | Status: AC

## 2021-12-18 MED ORDER — LACTATED RINGERS IV SOLN: INTRAVENOUS | Status: DC

## 2021-12-18 MED ORDER — ROSUVASTATIN CALCIUM 10 MG PO TABS
40.0000 mg | ORAL_TABLET | Freq: Every evening | ORAL | Status: DC
  Administered 2021-12-19: 40 mg via ORAL
  Filled 2021-12-18: qty 4

## 2021-12-18 MED ORDER — CARVEDILOL 6.25 MG PO TABS
6.2500 mg | ORAL_TABLET | Freq: Two times a day (BID) | ORAL | Status: DC
  Administered 2021-12-19 – 2021-12-21 (×5): 6.25 mg via ORAL
  Filled 2021-12-18 (×5): qty 1

## 2021-12-18 MED ORDER — LACTATED RINGERS IV BOLUS
2000.0000 mL | Freq: Once | INTRAVENOUS | Status: AC
Start: 2021-12-18 — End: 2021-12-18
  Administered 2021-12-18: 2000 mL via INTRAVENOUS

## 2021-12-18 MED ORDER — ONDANSETRON HCL 4 MG/2ML IJ SOLN
4.0000 mg | Freq: Once | INTRAMUSCULAR | Status: AC
  Administered 2021-12-18: 4 mg via INTRAVENOUS
  Filled 2021-12-18: qty 2

## 2021-12-18 MED ORDER — LORAZEPAM 2 MG/ML IJ SOLN: 0.0000 mg | Freq: Three times a day (TID) | INTRAMUSCULAR | Status: DC

## 2021-12-18 MED ORDER — ADULT MULTIVITAMIN W/MINERALS CH
1.0000 | ORAL_TABLET | Freq: Every day | ORAL | Status: DC
  Administered 2021-12-20: 1 via ORAL
  Filled 2021-12-18: qty 1

## 2021-12-18 MED ORDER — LABETALOL HCL 5 MG/ML IV SOLN: 10.0000 mg | Freq: Three times a day (TID) | INTRAVENOUS | Status: DC | PRN

## 2021-12-18 MED ORDER — LORAZEPAM 1 MG PO TABS: 1.0000 mg | ORAL_TABLET | ORAL | Status: DC | PRN

## 2021-12-18 NOTE — ED Triage Notes (Signed)
Pt states has weakness for a week, states began to have vomiting yesterday. Pt states two negative covid tests. Pt denies known fever.

## 2021-12-18 NOTE — Assessment & Plan Note (Signed)
IV hydralazine as needed until able to tolerate orally consistent.  Continue Coreg

## 2021-12-18 NOTE — Assessment & Plan Note (Signed)
Improved with hydration, likely prerenal  Lab Results  Component Value Date   CREATININE 0.95 12/19/2021   CREATININE 0.97 12/19/2021   CREATININE 1.11 12/19/2021

## 2021-12-18 NOTE — Assessment & Plan Note (Addendum)
Transitioned to subcutaneous insulin as the anion gap closed Continue insulin Semglee and sliding scale.  Add meal coverage NovoLog

## 2021-12-18 NOTE — Progress Notes (Signed)
At 1430 CBG check, endo tool gave me a pop up about pts blood glucose still being high and not responding to treatment, It gave a list of possible issues, such as infiltrated IV, pt had eaten.... all of which I assessed and nothing had occurred. I notified charge RN and paged diabetes coordinator to find out if there is anything further she would like me to do. I did go forward with the endo tool and adjusted pts insulin gtt per endo tool. Will continue to monitor.

## 2021-12-18 NOTE — Progress Notes (Signed)
Anticoagulation monitoring(Lovenox):  56 yo  male ordered Lovenox 40 mg Q24h    Filed Weights   12/18/21 0341  Weight: 97.1 kg (214 lb)   BMI  31.6    Lab Results  Component Value Date   CREATININE 1.96 (H) 12/18/2021   CREATININE 0.50 (L) 04/16/2021   CREATININE 0.52 (L) 04/16/2021   Estimated Creatinine Clearance: 49.5 mL/min (A) (by C-G formula based on SCr of 1.96 mg/dL (H)). Hemoglobin & Hematocrit     Component Value Date/Time   HGB 15.5 12/18/2021 0345   HGB 15.4 11/20/2017 0909   HCT 47.4 12/18/2021 0345   HCT 44.2 11/20/2017 0909     Per Protocol for Patient with estCrcl >30 ml/min and BMI > 30, will transition to Lovenox 47.5 mg Q24h.

## 2021-12-18 NOTE — ED Provider Notes (Addendum)
[facility] Provider Note    Event Date/Time   First MD Initiated Contact with Patient 12/18/21 [phone]     (approximate)   History   Weakness   HPI  Randy Deleon is a 56 y.o. male who presents to the ED for evaluation of Weakness   I reviewed medical DC summary from January where patient was admitted for HHS.  He is a history of type 1 diabetes.  Patient presents to the ED for evaluation of generalized weakness and malaise.  Reports he has been sick for the past 1 week.  Reports generalized malaise.  He saw his PCP and was tested for COVID, which was negative, as well as a negative home antigen test.  He denies any focal symptoms such as pain to the abdomen, chest or back.  Denies any syncopal episodes or falls.  Denies any rashes or fevers.  Reports he was started on antiviral medications for supposed viral infection.  Reports that he has not been taking his insulin this week while he has been feeling bad.  Physical Exam   Triage Vital Signs: ED Triage Vitals [12/18/21 0341]  Enc Vitals Group     BP (!) 142/69     Pulse Rate (!) 110     Resp 16     Temp 98.7 F (37.1 C)     Temp Source Oral     SpO2 99 %     Weight 214 lb (97.1 kg)     Height 5\' 9"  (1.753 m)     Head Circumference      Peak Flow      Pain Score      Pain Loc      Pain Edu?      Excl. in GC?     Most recent vital signs: Vitals:   12/18/21 0341  BP: (!) 142/69  Pulse: (!) 110  Resp: 16  Temp: 98.7 F (37.1 C)  SpO2: 99%    General: Awake, no distress.  Very weak.  Requires 2 person assist to transfer from a wheelchair to the stretcher. CV:  Good peripheral perfusion.  Resp:  Normal effort.  Abd:  No distention.  Soft and benign throughout MSK:  No deformity noted.  Neuro:  No focal deficits appreciated. Other:  Dry mucous membranes.   ED Results / Procedures / Treatments   Labs (all labs ordered are listed, but only abnormal results are displayed) Labs  Reviewed  CBC - Abnormal; Notable for the following components:      Result Value   MCV 100.9 (*)    All other components within normal limits  COMPREHENSIVE METABOLIC PANEL - Abnormal; Notable for the following components:   Sodium 131 (*)    Potassium 5.2 (*)    Chloride 91 (*)    CO2 9 (*)    Glucose, Bld 573 (*)    BUN 45 (*)    Creatinine, Ser 1.96 (*)    Total Protein 8.2 (*)    AST 12 (*)    Total Bilirubin 3.3 (*)    GFR, Estimated 40 (*)    Anion gap 31 (*)    All other components within normal limits  CBG MONITORING, ED - Abnormal; Notable for the following components:   Glucose-Capillary 519 (*)    All other components within normal limits  CULTURE, BLOOD (ROUTINE X 2)  CULTURE, BLOOD (ROUTINE X 2)  BETA-HYDROXYBUTYRIC ACID  URINALYSIS, ROUTINE W REFLEX MICROSCOPIC  MAGNESIUM  BLOOD GAS, VENOUS  LIPASE, BLOOD  BASIC METABOLIC PANEL  BASIC METABOLIC PANEL  BASIC METABOLIC PANEL  BASIC METABOLIC PANEL  BASIC METABOLIC PANEL  BETA-HYDROXYBUTYRIC ACID  BETA-HYDROXYBUTYRIC ACID  BETA-HYDROXYBUTYRIC ACID  CBG MONITORING, ED  TROPONIN I (HIGH SENSITIVITY)    EKG Sinus tachycardia with a rate of 104 bpm.  Normal axis.  QTc 499.  No clear STEMI.  RADIOLOGY CXR interpreted by me without evidence of acute cardiopulmonary pathology.  Official radiology report(s): DG Chest Portable 1 View  Result Date: 12/18/2021 CLINICAL DATA:  Weakness for a week.  Vomiting yesterday. EXAM: PORTABLE CHEST 1 VIEW COMPARISON:  03/25/2018 FINDINGS: Normal heart size and mediastinal contours. No acute infiltrate or edema. No effusion or pneumothorax. No acute osseous findings. IMPRESSION: Negative portable chest. Electronically Signed   By: Tiburcio Pea M.D.   On: 12/18/2021 04:40    PROCEDURES and INTERVENTIONS:  .1-3 Lead EKG Interpretation  Performed by: Delton Prairie, MD Authorized by: Delton Prairie, MD     Interpretation: abnormal     ECG rate:  110   ECG rate assessment:  tachycardic     Rhythm: sinus tachycardia     Ectopy: none     Conduction: normal   .Critical Care  Performed by: Delton Prairie, MD Authorized by: Delton Prairie, MD   Critical care provider statement:    Critical care time (minutes):  30   Critical care time was exclusive of:  Separately billable procedures and treating other patients   Critical care was necessary to treat or prevent imminent or life-threatening deterioration of the following conditions:  Endocrine crisis, metabolic crisis and dehydration   Critical care was time spent personally by me on the following activities:  Development of treatment plan with patient or surrogate, discussions with consultants, evaluation of patient's response to treatment, examination of patient, ordering and review of laboratory studies, ordering and review of radiographic studies, ordering and performing treatments and interventions, pulse oximetry, re-evaluation of patient's condition and review of old charts Ultrasound ED Peripheral IV (Provider)  Date/Time: 12/18/2021 5:42 AM  Performed by: Delton Prairie, MD Authorized by: Delton Prairie, MD   Procedure details:    Indications: hydration     Skin Prep: chlorhexidine gluconate     Location: right basilic v.   Angiocath:  18 G   Bedside Ultrasound Guided: Yes     Images: not archived     Patient tolerated procedure without complications: Yes     Dressing applied: Yes     Medications  insulin regular, human (MYXREDLIN) 100 units/ 100 mL infusion (has no administration in time range)  lactated ringers infusion (has no administration in time range)  dextrose 5 % in lactated ringers infusion (has no administration in time range)  dextrose 50 % solution 0-50 mL (has no administration in time range)  lactated ringers bolus 2,000 mL (2,000 mLs Intravenous New Bag/Given 12/18/21 0415)  ondansetron (ZOFRAN) injection 4 mg (4 mg Intravenous Given 12/18/21 0454)     IMPRESSION / MDM / ASSESSMENT AND  PLAN / ED COURSE  I reviewed the triage vital signs and the nursing notes.  Differential diagnosis includes, but is not limited to, DKA, HHS, sepsis  {Patient presents with symptoms of an acute illness or injury that is potentially life-threatening.  56yo M presents to the ED with generalized weakness and evidence of DKA requiring insulin drip and medical admission.  He looks unwell and dry.  Tachycardic but hemodynamically stable.  Hyperglycemic and acidotic with anion gap  of 31.  No leukocytosis and I doubt bacterial etiology or sepsis.  Uncertain precipitator of this episode, possibly viral syndrome.  Interestingly, has been hydroxybutyric acid is normal.  Awaiting venous blood gas.  Considering his metabolic changes, acidosis and AKI I do suspect DKA or HHS.  We will initiate fluid resuscitation and insulin drip.  Clinical Course as of 12/18/21 0500  Sun Dec 18, 2021  0458 I consult with medicine for admission [DS]    Clinical Course User Index [DS] Vladimir Crofts, MD     FINAL CLINICAL IMPRESSION(S) / ED DIAGNOSES   Final diagnoses:  Diabetic ketoacidosis without coma associated with type 1 diabetes mellitus ([place])  Generalized weakness  Dehydration  AKI (acute kidney injury) ([place])     Rx / DC Orders   ED Discharge Orders     None        Note:  This document was prepared using Dragon voice recognition software and may include unintentional dictation errors.   Vladimir Crofts, MD 12/18/21 0500    Vladimir Crofts, MD 12/18/21 [phone]

## 2021-12-18 NOTE — Final Progress Note (Signed)
Patient seen and examined while in the ED.  Please see Dr. Josefine Class dictated history and physical for further details.  I agree with assessment plan  DKA Continue insulin drip for now  Nausea and vomiting with some concern of brown/bloody emesis per nursing GI consult  Time spent: 35 minutes

## 2021-12-18 NOTE — ED Notes (Signed)
Dark/bloody vomit noted. RN to notified provider, as need for holding lovenox for possible GI bleed.

## 2021-12-18 NOTE — Consult Note (Signed)
Inpatient Consultation   Patient ID: Randy Deleon is a 56 y.o. male.  Requesting Provider: Delfino Lovett, MD  Date of Admission: 12/18/2021  Date of Consult: 12/18/21   Reason for Consultation: dark emesis   Patient's Chief Complaint:   Chief Complaint  Patient presents with   Weakness    HPI  56 y/o Male with DM1, HTN, tobacco dependence who presents 1 week of worsening malaise. Pt reports he started having non bloody emesis yesterday 4-5 times. He has not been taking his diabetic medications during this time period. GI is consulted after nursing noted some possible blood associated with emesis in the ED.  Macrocytosis with hgb of 15.5 and MCV >100 that saw global decrease in cell lines with IVF resuscitation in setting of dka to only 14.7. He has remained hemodynamically stable. ICU nursing notes continued emesis, however, this is brown and gastric contents in nature without any signs of bleeding.  Pt reports regular brown BM at home and denies melena and hematochezia. No hematemesis.  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; melanosis coli  Past Medical History:  Diagnosis Date   Diabetes mellitus without complication (HCC)    Hypertension     Past Surgical History:  Procedure Laterality Date   COLONOSCOPY WITH PROPOFOL N/A 04/16/2021   Procedure: COLONOSCOPY WITH PROPOFOL;  Surgeon: Toney Reil, MD;  Location: [facility] ENDOSCOPY;  Service: Gastroenterology;  Laterality: N/A;   none     TONSILLECTOMY      Allergies  Allergen Reactions   Contrast Media [Iodinated Contrast Media]     Renal failure   Other Rash and Other (See Comments)    Tested allergic to LIMA BEANS   Shellfish Allergy Rash and Other (See Comments)    TESTED ALLERGIC TO "SHELLFISH"    Family History   Problem Relation Age of Onset   Diabetes Mother    Hypertension Mother    Congestive Heart Failure Father    Diabetes Father    Hypertension Father     Social History   Tobacco Use   Smoking status: Every Day    Packs/day: 0.50    Years: 20.00    Total pack years: 10.00    Types: Cigarettes    Start date: 01/29/1997   Smokeless tobacco: Never   Tobacco comments:    Refer to [facility]  Vaping Use   Vaping Use: Never used  Substance Use Topics   Alcohol use: Not Currently    Comment: occ   Drug use: Not Currently     Pertinent GI related history and allergies were reviewed with the patient  Review of Systems  Constitutional:  Positive for activity change, appetite change and fatigue. Negative for chills, diaphoresis, fever and unexpected weight change.  HENT:  Negative for trouble swallowing and voice change.   Respiratory:  Negative for shortness of breath and wheezing.   Cardiovascular:  Negative for chest pain, palpitations and leg swelling.  Gastrointestinal:  Positive for nausea and vomiting. Negative for abdominal distention, abdominal pain, anal bleeding, blood in stool, constipation and diarrhea.  Musculoskeletal:  Negative for arthralgias and myalgias.  Skin:  Negative for color change and pallor.  Neurological:  Positive for weakness. Negative for dizziness and syncope.  Psychiatric/Behavioral:  Negative for confusion. The patient is not nervous/anxious.   All other systems reviewed and are  negative.    Medications Home Medications No current facility-administered medications on file prior to encounter.   Current Outpatient Medications on File Prior to Encounter  Medication Sig Dispense Refill   azithromycin (ZITHROMAX) 250 MG tablet Take 250 mg by mouth daily.     benzonatate (TESSALON) 100 MG capsule Take 100 mg by mouth 3 (three) times daily.     Cholecalciferol 125 MCG (5000 UT) capsule Take 5,000 Units by mouth daily.     RYBELSUS 7 MG TABS Take 7  mg by mouth daily.     acetaminophen (TYLENOL) 325 MG tablet Take 2 tablets (650 mg total) by mouth every 6 (six) hours as needed for mild pain or fever.     amLODipine-benazepril (LOTREL) 10-20 MG capsule Take 1 capsule by mouth every morning.     carvedilol (COREG) 25 MG tablet Take 25 mg by mouth 2 (two) times daily.     Cholecalciferol (VITAMIN D3) 125 MCG (5000 UT) CAPS Take 5,000 Units by mouth daily.     dapagliflozin propanediol (FARXIGA) 10 MG TABS tablet Take by mouth daily.     fenofibrate 160 MG tablet Take 160 mg by mouth every morning.     gabapentin (NEURONTIN) 300 MG capsule Take 300 mg by mouth 3 (three) times daily. (Patient not taking: Reported on 12/18/2021)     icosapent Ethyl (VASCEPA) 1 g capsule Take 2 g by mouth 2 (two) times daily. (Patient not taking: Reported on 12/18/2021)     insulin aspart (NOVOLOG) 100 UNIT/ML FlexPen Use TID with meals and bedtime: CBG 121-150: 3u; CBG 151-200: 4u; CBG 201-250: 7u; CBG 251-300: 11u; CBG 301- 350: 15u; CBG 351-400: 20u (Patient not taking: Reported on 12/18/2021) 15 mL 0   insulin glargine, 2 Unit Dial, (TOUJEO MAX SOLOSTAR) 300 UNIT/ML Solostar Pen Inject 100 Units into the skin. (Patient not taking: Reported on 12/18/2021)     insulin lispro (HUMALOG) 100 UNIT/ML KwikPen Inject into the skin 3 (three) times daily.     metFORMIN (GLUCOPHAGE) 1000 MG tablet Take 1,000 mg by mouth 2 (two) times daily.     pioglitazone (ACTOS) 45 MG tablet Take 45 mg by mouth every morning.     rosuvastatin (CRESTOR) 40 MG tablet Take 40 mg by mouth every evening.     sildenafil (VIAGRA) 100 MG tablet Take 100 mg by mouth daily as needed.     Pertinent GI related medications were reviewed with the patient  Inpatient Medications  Current Facility-Administered Medications:    Chlorhexidine Gluconate Cloth 2 % PADS 6 each, 6 each, Topical, Q0600, Athena Masse, MD   dextrose 5 % in lactated ringers infusion, , Intravenous, Continuous, Vladimir Crofts,  MD   dextrose 5 % in lactated ringers infusion, , Intravenous, Continuous, Damita Dunnings, Hazel V, MD   dextrose 50 % solution 0-50 mL, 0-50 mL, Intravenous, PRN, Vladimir Crofts, MD   dextrose 50 % solution 0-50 mL, 0-50 mL, Intravenous, PRN, Athena Masse, MD   hydrALAZINE (APRESOLINE) injection 5 mg, 5 mg, Intravenous, Q4H PRN, Athena Masse, MD   insulin regular, human (MYXREDLIN) 100 units/ 100 mL infusion, , Intravenous, Continuous, Vladimir Crofts, MD, Last Rate: 8.5 mL/hr at 12/18/21 0804, 8.5 Units/hr at 12/18/21 0804   lactated ringers infusion, , Intravenous, Continuous, Vladimir Crofts, MD, Last Rate: 125 mL/hr at 12/18/21 0804, Infusion Verify at 12/18/21 0804   promethazine (PHENERGAN) 12.5 mg in sodium chloride 0.9 % 50 mL IVPB, 12.5 mg, Intravenous, Q6H PRN, Athena Masse, MD,  Last Rate: 200 mL/hr at 12/18/21 0804, 12.5 mg at 12/18/21 0804  dextrose 5% lactated ringers     dextrose 5% lactated ringers     insulin 8.5 Units/hr (12/18/21 0804)   lactated ringers 125 mL/hr at 12/18/21 0804   promethazine (PHENERGAN) injection (IM or IVPB) 12.5 mg (12/18/21 0804)    dextrose, dextrose, hydrALAZINE, promethazine (PHENERGAN) injection (IM or IVPB)   Objective   Vitals:   12/18/21 0700 12/18/21 0751 12/18/21 0810 12/18/21 0840  BP: 129/67  137/61 129/66  Pulse: (!) 111  (!) 114 (!) 111  Resp: 17  20 (!) 23  Temp:  98 F (36.7 C)  98 F (36.7 C)  TempSrc:  Oral  Oral  SpO2: 98%  98% 98%  Weight:    92 kg  Height:    5\' 9"  (1.753 m)     Physical Exam Vitals and nursing note reviewed.  Constitutional:      General: He is not in acute distress.    Appearance: He is ill-appearing. He is not toxic-appearing or diaphoretic.  HENT:     Head: Normocephalic and atraumatic.     Nose: Nose normal.     Mouth/Throat:     Mouth: Mucous membranes are dry.     Pharynx: Oropharynx is clear.  Eyes:     General: No scleral icterus.    Extraocular Movements: Extraocular movements intact.   Cardiovascular:     Rate and Rhythm: Normal rate and regular rhythm.     Heart sounds: Normal heart sounds. No murmur heard.    No friction rub. No gallop.  Pulmonary:     Effort: Pulmonary effort is normal. No respiratory distress.     Breath sounds: Normal breath sounds. No wheezing, rhonchi or rales.  Abdominal:     General: Bowel sounds are normal. There is no distension.     Palpations: Abdomen is soft.     Tenderness: There is no abdominal tenderness. There is no guarding or rebound.  Musculoskeletal:     Cervical back: Neck supple.     Right lower leg: No edema.     Left lower leg: No edema.  Skin:    General: Skin is warm and dry.     Coloration: Skin is not jaundiced or pale.  Neurological:     General: No focal deficit present.     Mental Status: He is alert and oriented to person, place, and time. Mental status is at baseline.  Psychiatric:        Mood and Affect: Mood normal.        Behavior: Behavior normal.        Thought Content: Thought content normal.        Judgment: Judgment normal.     Comments: withdrawn     Laboratory Data Recent Labs  Lab 12/18/21 0345 12/18/21 0820  WBC 10.0 9.1  HGB 15.5 14.7  HCT 47.4 44.4  PLT 194 165   Recent Labs  Lab 12/18/21 0345 12/18/21 0820  NA 131* 136  K 5.2* 4.0  CL 91* 100  CO2 9* 11*  BUN 45* 39*  CALCIUM 9.4 9.1  PROT 8.2*  --   BILITOT 3.3*  --   ALKPHOS 51  --   ALT 12  --   AST 12*  --   GLUCOSE 573* 379*   No results for input(s): "INR" in the last 168 hours.  Recent Labs    12/18/21 0406  LIPASE 123*  Imaging Studies: DG Chest Portable 1 View  Result Date: 12/18/2021 CLINICAL DATA:  Weakness for a week.  Vomiting yesterday. EXAM: PORTABLE CHEST 1 VIEW COMPARISON:  03/25/2018 FINDINGS: Normal heart size and mediastinal contours. No acute infiltrate or edema. No effusion or pneumothorax. No acute osseous findings. IMPRESSION: Negative portable chest. Electronically Signed   By:  Tiburcio Pea M.D.   On: 12/18/2021 04:40    Assessment:   # Nausea and vomiting in setting of DKA and uncontrolled DM (a1c 12+) - pt with persistent n/v due to his DKA - anti emetics, ivf resuscitation, and insulin gtt are improving his sx - emesis is brown and without blood  # Macrocytosis w/o anemia - hgb 15.5 on admission- 14.7 with fluid resuscitation and stable vital signs - bun cr 39/1.5  # DKA  # Multiple electrolyte derangements  # h/o pancreatitis  # tobacco dependence  # malaise   Plan:  Continue supportive care including anti emetics at this time No signs of gib and hgb (at baseline) and vital signs remain stable Continue bid ppi as he likely has gastritis/esophagitis at this time from repeated vomiting.  No indication for endoscopy at this time Avoid nsaids Monitor H&H  Electrolyte correction as per primary team  GI to sign off. Available as needed.   I personally performed the service.  Management of other medical comorbidities as per primary team  Thank you for allowing [place] to participate in this patient's care. Please don't hesitate to call if any questions or concerns arise.   Jaynie Collins, DO [facility]  Portions of the record may have been created with voice recognition software. Occasional wrong-word or 'sound-a-like' substitutions may have occurred due to the inherent limitations of voice recognition software.  Read the chart carefully and recognize, using context, where substitutions may have occurred.

## 2021-12-18 NOTE — Consult Note (Signed)
Pulmonary and Critical care    NAME:  Randy Deleon, MRN:  [id number], DOB:  1965-11-10, LOS: 0 ADMISSION DATE:  12/18/2021,  Consultation requested by Dr. Carlynn Spry Reason for consult.  DKA with vomiting concern for GI bleed.    History of Present Illness:   Patient is a 56 year old male active smoker and he smokes about half pack of cigarettes daily and also drinks about 2-3 drinks of liquor over the weekend. He has been diagnosed with diabetes about 10 to 15 years ago.  The patient denies any cardiac history, he presented to the emergency room on October 1 with weakness nausea and vomiting at home.  The patient was advised by PCP to perform COVID test which was negative.  He denied to any fever chills sore throat coughing or headache.  In the emergency room patient was found to have blood pressure 142/69 pulse 110 respirations 16, laboratory data showed sodium 131 potassium 5.2 creatinine 1.96 baseline creatinine is near normal.  The patient was admitted by the hospitalist he had nonbilious vomiting however there was a concern per the nursing staff that it was dark Durning for GI bleed. GI services were also consulted.  Patient however did not have any significant decline in hemoglobin. His nausea has improved with Phenergan as well as ondansetron.  pH 7.1 has been started on insulin drip.  Denies headache denies chest pain.  Denies shortness of breath currently.  Patient is not compliant to insulin Review of systems is negative except as mentioned above.          12/18/2021    8:40 AM 12/18/2021    8:10 AM 12/18/2021    7:00 AM  Vitals with BMI  Height 5\' 9"     Weight 202 lbs 13 oz    BMI 96.75    Systolic 916 384 665  Diastolic 66 61 67  Pulse 993 114 111        Examination: General: Alert uncomfortable and nauseous.  No obvious distress seen HENT: No pallor or icterus. Lungs: Air entry heard bilaterally no wheezing or crackles. Cardiovascular: Heart S1-S2 no murmurs or  gallops. Abdomen: Soft generalized tenderness nonspecific overall mild tenderness no rebound or guarding. Extremities: No edema or cyanosis Neuro: 3x oriented moving all extremities to commands     Assessment & Plan:  1.  DKA due to noncompliance 2.  Nausea vomiting 3.?  Upper GI bleed 4.  ACute kidney injury 5.  Essential hypertension 6.  Cigarette smoker and alcohol use over weekends as above in HPI Plan 1.  Continue on insulin infusion 2.  3 with IV fluids 3.  Started on pantoprazole 4.  Continue to monitor nausea vomiting, GI has signed off 5.  Resume Coreg tonight 6.  Can start aspirin and DVT prophylaxis with anticoagulant if no clear signs of bleeding in the next 24 to 36 hours.   Best Practice (right click and "Reselect all SmartList Selections" daily)   Diet/type: NPO w/ oral meds DVT prophylaxis: SCD GI prophylaxis: PPI Lines: N/A Foley:  N/A Code Status:  full code   Labs   CBC: Recent Labs  Lab 12/18/21 0345 12/18/21 0820  WBC 10.0 9.1  HGB 15.5 14.7  HCT 47.4 44.4  MCV 100.9* 99.3  PLT 194 570    Basic Metabolic Panel: Recent Labs  Lab 12/18/21 0345 12/18/21 0406 12/18/21 0820  NA 131*  --  136  K 5.2*  --  4.0  CL 91*  --  100  CO2 9*  --  11*  GLUCOSE 573*  --  379*  BUN 45*  --  39*  CREATININE 1.96*  --  1.50*  CALCIUM 9.4  --  9.1  MG  --  1.2*  --    GFR: Estimated Creatinine Clearance: 63.1 mL/min (A) (by C-G formula based on SCr of 1.5 mg/dL (H)). Recent Labs  Lab 12/18/21 0345 12/18/21 0820  WBC 10.0 9.1    Liver Function Tests: Recent Labs  Lab 12/18/21 0345  AST 12*  ALT 12  ALKPHOS 51  BILITOT 3.3*  PROT 8.2*  ALBUMIN 4.5   Recent Labs  Lab 12/18/21 0406  LIPASE 123*   No results for input(s): "AMMONIA" in the last 168 hours.  ABG    Component Value Date/Time   PHART 7.313 (L) 03/26/2018 0708   PCO2ART 46.5 03/26/2018 0708   PO2ART 92.0 03/26/2018 0708   HCO3 9.6 (L) 12/18/2021 0406   TCO2 25  03/26/2018 0708   ACIDBASEDEF 18.9 (H) 12/18/2021 0406   O2SAT 66.8 12/18/2021 0406     Coagulation Profile: No results for input(s): "INR", "PROTIME" in the last 168 hours.  Cardiac Enzymes: No results for input(s): "CKTOTAL", "CKMB", "CKMBINDEX", "TROPONINI" in the last 168 hours.  HbA1C: Hgb A1c MFr Bld  Date/Time Value Ref Range Status  12/18/2021 06:08 AM 12.0 (H) 4.8 - 5.6 % Final    Comment:    (NOTE) Pre diabetes:          5.7%-6.4%  Diabetes:              >6.4%  Glycemic control for   <7.0% adults with diabetes   04/15/2021 09:59 AM 13.6 (H) 4.8 - 5.6 % Final    Comment:    (NOTE)         Prediabetes: 5.7 - 6.4         Diabetes: >6.4         Glycemic control for adults with diabetes: <7.0     CBG: Recent Labs  Lab 12/18/21 0715 12/18/21 0747 12/18/21 0845 12/18/21 0941 12/18/21 1044  GLUCAP 408* 356* 350* 324* 244*    RADIOLOGY Chest x-ray was done during this hospitalization no acute changes seen.  Review of Systems:   Patient reports nausea vomiting feeling tired abdominal pain no fever or chills no sore throat.  Denies focal logic weakness.  Notices minimal heartburn.  No history of fall.  Review of systems is negative except as mentioned above. Past Medical History:  He,  has a past medical history of Diabetes mellitus without complication (HCC) and Hypertension.   Surgical History:   Past Surgical History:  Procedure Laterality Date   COLONOSCOPY WITH PROPOFOL N/A 04/16/2021   Procedure: COLONOSCOPY WITH PROPOFOL;  Surgeon: Toney Reil, MD;  Location[facility] END[facility]COPY;  Service: Gastroenterology;  Laterality: N/A;   none     TONSILLECTOMY       Social History:   reports that he has been smoking cigarettes. He started smoking about 24 years ago. He has a 10.00 pack-year smoking history. He has never used smokeless tobacco. He reports that he does not currently use alcohol. He reports that he does not currently use drugs.   Family  History:  His family history includes Congestive Heart Failure in his father; Diabetes in his father and mother; Hypertension in his father and mother.   Allergies Allergies  Allergen Reactions   Contrast Media [Iodinated Contrast Media]     Renal failure  Other Rash and Other (See Comments)    Tested allergic to LIMA BEANS   Shellfish Allergy Rash and Other (See Comments)    TESTED ALLERGIC TO "SHELLFISH"     Home Medications  Prior to Admission medications   Medication Sig Start Date End Date Taking? Authorizing Provider  azithromycin (ZITHROMAX) 250 MG tablet Take 250 mg by mouth daily. 12/13/21  Yes [provider]  benzonatate (TESSALON) 100 MG capsule Take 100 mg by mouth 3 (three) times daily. 12/13/21  Yes [provider]  Cholecalciferol 125 MCG (5000 UT) capsule Take 5,000 Units by mouth daily. 10/20/20  Yes [provider]  RYBELSUS 7 MG TABS Take 7 mg by mouth daily. 12/15/21  Yes [provider]  acetaminophen (TYLENOL) 325 MG tablet Take 2 tablets (650 mg total) by mouth every 6 (six) hours as needed for mild pain or fever. 04/16/21   Sunnie Nielsen, DO  amLODipine-benazepril (LOTREL) 10-20 MG capsule Take 1 capsule by mouth every morning. 05/30/19   [provider]  carvedilol (COREG) 25 MG tablet Take 25 mg by mouth 2 (two) times daily. 05/30/19   [provider]  Cholecalciferol (VITAMIN D3) 125 MCG (5000 UT) CAPS Take 5,000 Units by mouth daily.    [provider]  dapagliflozin propanediol (FARXIGA) 10 MG TABS tablet Take by mouth daily.    [provider]  fenofibrate 160 MG tablet Take 160 mg by mouth every morning. 05/30/19   [provider]  gabapentin (NEURONTIN) 300 MG capsule Take 300 mg by mouth 3 (three) times daily. Patient not taking: Reported on 12/18/2021 06/16/19   [provider]  icosapent Ethyl (VASCEPA) 1 g capsule Take 2 g by mouth 2 (two) times daily. Patient not  taking: Reported on 12/18/2021    [provider]  insulin aspart (NOVOLOG) 100 UNIT/ML FlexPen Use TID with meals and bedtime: CBG 121-150: 3u; CBG 151-200: 4u; CBG 201-250: 7u; CBG 251-300: 11u; CBG 301- 350: 15u; CBG 351-400: 20u Patient not taking: Reported on 12/18/2021 03/30/18   Edsel Petrin, DO  insulin glargine, 2 Unit Dial, (TOUJEO MAX SOLOSTAR) 300 UNIT/ML Solostar Pen Inject 100 Units into the skin. Patient not taking: Reported on 12/18/2021    [provider]  insulin lispro (HUMALOG) 100 UNIT/ML KwikPen Inject into the skin 3 (three) times daily. 11/10/21   [provider]  metFORMIN (GLUCOPHAGE) 1000 MG tablet Take 1,000 mg by mouth 2 (two) times daily. 05/14/19   [provider]  pioglitazone (ACTOS) 45 MG tablet Take 45 mg by mouth every morning. 12/09/20   [provider]  rosuvastatin (CRESTOR) 40 MG tablet Take 40 mg by mouth every evening. 01/10/21   [provider]  sildenafil (VIAGRA) 100 MG tablet Take 100 mg by mouth daily as needed. 06/16/19   [provider]      Jonny Longino MD ([facility]) FOR [facility]

## 2021-12-18 NOTE — H&P (Addendum)
History and Physical    Patient: Randy Deleon OJJ:[id number] DOB: 1966-02-01 DOA: 12/18/2021 DOS: the patient was seen and examined on 12/18/2021 PCP: Sherron Monday, MD  Patient coming from: Home  Chief Complaint:  Chief Complaint  Patient presents with   Weakness    HPI: Randy Deleon is a 56 y.o. male with medical history significant for Type 1 diabetes, hypertension who presents to the ED with a 1 week history of generalized malaise and a 1 day history of non bloody , non bilious vomiting.He saw his PCP twice in the past week and tested negative for COVID on 2 occasions.  He denies cough or shortness of breath or chest pain.  He denied abdominal pain until arrival to the ED when he complained of mild burning in the epigastric area.  Denies diarrhea or dysuria.  Denies fevers or chills.  Due to generalized weakness and nausea he did not take insulin over the past week. ED course and data review: BP 142/69 with pulse 110.  Afebrile.  Labs with glucose 573 with anion gap of 31, bicarb 9, sodium 131 and potassium 5.2.  Creatinine 1.96 above baseline of 0.5.  CBC unremarkable.  Lipase and LFTs pending.  Beta hydroxybutyric acid 0.21.  EKG, personally viewed and interpreted with sinus tachycardia at 104 with nonspecific ST-T wave changes.  Chest x-ray clear.  Urinalysis pending. Patient started on IV fluid boluses for treatment of DKA along with IV insulin and hospitalist consulted for admission.   Review of Systems: As mentioned in the history of present illness. All other systems reviewed and are negative.  Past Medical History:  Diagnosis Date   Diabetes mellitus without complication (HCC)    Hypertension    Past Surgical History:  Procedure Laterality Date   COLONOSCOPY WITH PROPOFOL N/A 04/16/2021   Procedure: COLONOSCOPY WITH PROPOFOL;  Surgeon: Toney Reil, MD;  Location: [facility] ENDOSCOPY;  Service: Gastroenterology;  Laterality: N/A;   none     TONSILLECTOMY      Social History:  reports that he has been smoking cigarettes. He started smoking about 24 years ago. He has a 10.00 pack-year smoking history. He has never used smokeless tobacco. He reports that he does not currently use alcohol. He reports that he does not currently use drugs.  Allergies  Allergen Reactions   Contrast Media [Iodinated Contrast Media]     Renal failure   Other Rash and Other (See Comments)    Tested allergic to LIMA BEANS   Shellfish Allergy Rash and Other (See Comments)    TESTED ALLERGIC TO "SHELLFISH"    Family History  Problem Relation Age of Onset   Diabetes Mother    Hypertension Mother    Congestive Heart Failure Father    Diabetes Father    Hypertension Father     Prior to Admission medications   Medication Sig Start Date End Date Taking? Authorizing Provider  acetaminophen (TYLENOL) 325 MG tablet Take 2 tablets (650 mg total) by mouth every 6 (six) hours as needed for mild pain or fever. 04/16/21   Sunnie Nielsen, DO  amLODipine-benazepril (LOTREL) 10-20 MG capsule Take 1 capsule by mouth every morning. 05/30/19   [provider]  carvedilol (COREG) 25 MG tablet Take 25 mg by mouth 2 (two) times daily. 05/30/19   [provider]  Cholecalciferol (VITAMIN D3) 125 MCG (5000 UT) CAPS Take 5,000 Units by mouth daily.    [provider]  dapagliflozin propanediol (FARXIGA) 10 MG TABS  tablet Take by mouth daily.    [provider]  fenofibrate 160 MG tablet Take 160 mg by mouth every morning. 05/30/19   [provider]  gabapentin (NEURONTIN) 300 MG capsule Take 300 mg by mouth 3 (three) times daily. 06/16/19   [provider]  icosapent Ethyl (VASCEPA) 1 g capsule Take 2 g by mouth 2 (two) times daily.    [provider]  insulin aspart (NOVOLOG) 100 UNIT/ML FlexPen Use TID with meals and bedtime: CBG 121-150: 3u; CBG 151-200: 4u; CBG 201-250: 7u; CBG 251-300: 11u; CBG 301- 350: 15u; CBG 351-400:  20u 03/30/18   Mikhail, Maryann, DO  insulin glargine, 2 Unit Dial, (TOUJEO MAX SOLOSTAR) 300 UNIT/ML Solostar Pen Inject 100 Units into the skin.    [provider]  metFORMIN (GLUCOPHAGE) 1000 MG tablet Take 1,000 mg by mouth 2 (two) times daily. 05/14/19   [provider]  pioglitazone (ACTOS) 45 MG tablet Take 45 mg by mouth every morning. 12/09/20   [provider]  rosuvastatin (CRESTOR) 40 MG tablet Take 40 mg by mouth every evening. 01/10/21   [provider]  sildenafil (VIAGRA) 100 MG tablet Take 100 mg by mouth daily as needed. 06/16/19   [provider]    Physical Exam: Vitals:   12/18/21 0341  BP: (!) 142/69  Pulse: (!) 110  Resp: 16  Temp: 98.7 F (37.1 C)  TempSrc: Oral  SpO2: 99%  Weight: 97.1 kg  Height: 5\' 9"  (1.753 m)   Physical Exam Vitals and nursing note reviewed.  Constitutional:      General: He is not in acute distress.    Appearance: He is ill-appearing.  HENT:     Head: Normocephalic and atraumatic.  Cardiovascular:     Rate and Rhythm: Regular rhythm. Tachycardia present.     Heart sounds: Normal heart sounds.  Pulmonary:     Effort: Pulmonary effort is normal.     Breath sounds: Normal breath sounds.  Abdominal:     Palpations: Abdomen is soft.     Tenderness: There is no abdominal tenderness.  Neurological:     Mental Status: Mental status is at baseline.     Labs on Admission: I have personally reviewed following labs and imaging studies  CBC: Recent Labs  Lab 12/18/21 0345  WBC 10.0  HGB 15.5  HCT 47.4  MCV 100.9*  PLT 623   Basic Metabolic Panel: Recent Labs  Lab 12/18/21 0345  NA 131*  K 5.2*  CL 91*  CO2 9*  GLUCOSE 573*  BUN 45*  CREATININE 1.96*  CALCIUM 9.4   GFR: Estimated Creatinine Clearance: 49.5 mL/min (A) (by C-G formula based on SCr of 1.96 mg/dL (H)). Liver Function Tests: Recent Labs  Lab 12/18/21 0345  AST 12*  ALT 12  ALKPHOS 51  BILITOT 3.3*  PROT  8.2*  ALBUMIN 4.5   No results for input(s): "LIPASE", "AMYLASE" in the last 168 hours. No results for input(s): "AMMONIA" in the last 168 hours. Coagulation Profile: No results for input(s): "INR", "PROTIME" in the last 168 hours. Cardiac Enzymes: No results for input(s): "CKTOTAL", "CKMB", "CKMBINDEX", "TROPONINI" in the last 168 hours. BNP (last 3 results) No results for input(s): "PROBNP" in the last 8760 hours. HbA1C: No results for input(s): "HGBA1C" in the last 72 hours. CBG: Recent Labs  Lab 12/18/21 0354  GLUCAP 519*   Lipid Profile: No results for input(s): "CHOL", "HDL", "LDLCALC", "TRIG", "CHOLHDL", "LDLDIRECT" in the last 72 hours. Thyroid  Function Tests: No results for input(s): "TSH", "T4TOTAL", "FREET4", "T3FREE", "THYROIDAB" in the last 72 hours. Anemia Panel: No results for input(s): "VITAMINB12", "FOLATE", "FERRITIN", "TIBC", "IRON", "RETICCTPCT" in the last 72 hours. Urine analysis:    Component Value Date/Time   COLORURINE STRAW (A) 06/28/2019 0430   APPEARANCEUR CLEAR (A) 06/28/2019 0430   LABSPEC 1.022 06/28/2019 0430   PHURINE 5.0 06/28/2019 0430   GLUCOSEU >=500 (A) 06/28/2019 0430   HGBUR MODERATE (A) 06/28/2019 0430   BILIRUBINUR NEGATIVE 06/28/2019 0430   KETONESUR 80 (A) 06/28/2019 0430   PROTEINUR 100 (A) 06/28/2019 0430   NITRITE NEGATIVE 06/28/2019 0430   LEUKOCYTESUR NEGATIVE 06/28/2019 0430    Radiological Exams on Admission: DG Chest Portable 1 View  Result Date: 12/18/2021 CLINICAL DATA:  Weakness for a week.  Vomiting yesterday. EXAM: PORTABLE CHEST 1 VIEW COMPARISON:  03/25/2018 FINDINGS: Normal heart size and mediastinal contours. No acute infiltrate or edema. No effusion or pneumothorax. No acute osseous findings. IMPRESSION: Negative portable chest. Electronically Signed   By: Tiburcio Pea M.D.   On: 12/18/2021 04:40     Data Reviewed: Relevant notes from primary care and specialist visits, past discharge summaries as  available in EHR, including Care Everywhere. Prior diagnostic testing as pertinent to current admission diagnoses Updated medications and problem lists for reconciliation ED course, including vitals, labs, imaging, treatment and response to treatment Triage notes, nursing and pharmacy notes and ED provider's notes Notable results as noted in HPI   Assessment and Plan: * DKA, type 1 (HCC) Patient symptomatic for generalized malaise and vomiting Follow lipase and LFTs and urinalysis No definite stigmata of infection pending above results.  Had negative COVID test at doctor's office IV fluid bolus and IV insulin per Endo tool Serial potassium checks with repletion per order set Transition to subcutaneous insulin when gap closes IV antiemetics and supportive care   AKI (acute kidney injury) (HCC) Creatinine 1.96 above baseline of 0.5 Expecting improvement with treatment of DKA Continue to monitor and avoid nephrotoxins  HTN (hypertension) IV hydralazine as needed until able to tolerate orally consistent        DVT prophylaxis: Lovenox  Consults: None  Advance Care Planning:   Code Status: Prior   Family Communication: Mother at bedside  Disposition Plan: Back to previous home environment  Severity of Illness: The appropriate patient status for this patient is INPATIENT. Inpatient status is judged to be reasonable and necessary in order to provide the required intensity of service to ensure the patient's safety. [name] presenting symptoms, physical exam findings, and initial radiographic and laboratory data in the context of their chronic comorbidities is felt to place them at high risk for further clinical deterioration. Furthermore, it is not anticipated that the patient will be medically stable for discharge from the hospital within 2 midnights of admission.   * I certify that at the point of admission it is my clinical judgment that the patient will require  inpatient hospital care spanning beyond 2 midnights from the point of admission due to high intensity of service, high risk for further deterioration and high frequency of surveillance required.*  Author: Andris Baumann, MD 12/18/2021 5:11 AM  For on call review [email or web address].

## 2021-12-18 NOTE — ED Notes (Signed)
RN notified Dr. Manuella Ghazi about pt emesis. Per provider, "hold lovenox and I will notify GI."

## 2021-12-18 NOTE — Hospital Course (Addendum)
56 year old male admitted for DKA  10/1: On insulin drip, GI consult for possible hematemesis, PCCM following for critical care management 10/2: Switch to subcu insulin as acidosis resolved.  Transfer to floors 10/3: Increasing insulin for better blood sugar control.  10/4: CBG remained elevated.  Semglee switched to 32 units twice daily instead of 60 units daily and mealtime coverage increased to 6 unit.  Patient remained stable.  Wants to go home.  Patient has type 2 diabetes and was not taking his home insulin as directed.  He was instructed to restart his home insulin along with his other medications and have a close follow-up with PCP for further recommendations and better control of diabetes. We made his home glargine to twice daily and he will continue taking Humalog as he was instructed before with a sliding scale. Patient was also instructed to continue checking his blood glucose level regularly.  Patient will continue with the rest of his home medications and follow-up with his providers for further recommendations.

## 2021-12-18 NOTE — ED Notes (Signed)
Advised nurse that patient has assigned bed 

## 2021-12-19 DIAGNOSIS — N179 Acute kidney failure, unspecified: Secondary | ICD-10-CM | POA: Diagnosis not present

## 2021-12-19 DIAGNOSIS — E1010 Type 1 diabetes mellitus with ketoacidosis without coma: Secondary | ICD-10-CM | POA: Diagnosis not present

## 2021-12-19 DIAGNOSIS — E860 Dehydration: Secondary | ICD-10-CM | POA: Diagnosis not present

## 2021-12-19 LAB — GLUCOSE, CAPILLARY
Glucose-Capillary: 139 mg/dL — ABNORMAL HIGH (ref 70–99)
Glucose-Capillary: 151 mg/dL — ABNORMAL HIGH (ref 70–99)
Glucose-Capillary: 152 mg/dL — ABNORMAL HIGH (ref 70–99)
Glucose-Capillary: 152 mg/dL — ABNORMAL HIGH (ref 70–99)
Glucose-Capillary: 152 mg/dL — ABNORMAL HIGH (ref 70–99)
Glucose-Capillary: 156 mg/dL — ABNORMAL HIGH (ref 70–99)
Glucose-Capillary: 165 mg/dL — ABNORMAL HIGH (ref 70–99)
Glucose-Capillary: 167 mg/dL — ABNORMAL HIGH (ref 70–99)
Glucose-Capillary: 174 mg/dL — ABNORMAL HIGH (ref 70–99)
Glucose-Capillary: 180 mg/dL — ABNORMAL HIGH (ref 70–99)
Glucose-Capillary: 184 mg/dL — ABNORMAL HIGH (ref 70–99)
Glucose-Capillary: 202 mg/dL — ABNORMAL HIGH (ref 70–99)
Glucose-Capillary: 210 mg/dL — ABNORMAL HIGH (ref 70–99)
Glucose-Capillary: 213 mg/dL — ABNORMAL HIGH (ref 70–99)
Glucose-Capillary: 222 mg/dL — ABNORMAL HIGH (ref 70–99)

## 2021-12-19 LAB — BASIC METABOLIC PANEL
Anion gap: 11 (ref 5–15)
Anion gap: 8 (ref 5–15)
Anion gap: 6 (ref 5–15)
BUN: 25 mg/dL — ABNORMAL HIGH (ref 6–20)
BUN: 21 mg/dL — ABNORMAL HIGH (ref 6–20)
BUN: 19 mg/dL (ref 6–20)
CO2: 26 mmol/L (ref 22–32)
CO2: 26 mmol/L (ref 22–32)
CO2: 26 mmol/L (ref 22–32)
Calcium: 9.7 mg/dL (ref 8.9–10.3)
Calcium: 9.5 mg/dL (ref 8.9–10.3)
Calcium: 9.5 mg/dL (ref 8.9–10.3)
Chloride: 104 mmol/L (ref 98–111)
Chloride: 106 mmol/L (ref 98–111)
Chloride: 103 mmol/L (ref 98–111)
Creatinine, Ser: 1.11 mg/dL (ref 0.61–1.24)
Creatinine, Ser: 0.97 mg/dL (ref 0.61–1.24)
Creatinine, Ser: 0.95 mg/dL (ref 0.61–1.24)
GFR, Estimated: >60 mL/min (ref >60)
GFR, Estimated: >60 mL/min (ref >60)
GFR, Estimated: >60 mL/min (ref >60)
Glucose, Bld: 162 mg/dL — ABNORMAL HIGH (ref 70–99)
Glucose, Bld: 159 mg/dL — ABNORMAL HIGH (ref 70–99)
Glucose, Bld: 162 mg/dL — ABNORMAL HIGH (ref 70–99)
Potassium: 3.3 mmol/L — ABNORMAL LOW (ref 3.5–5.1)
Potassium: 3.3 mmol/L — ABNORMAL LOW (ref 3.5–5.1)
Potassium: 3.4 mmol/L — ABNORMAL LOW (ref 3.5–5.1)
Sodium: 141 mmol/L (ref 135–145)
Sodium: 140 mmol/L (ref 135–145)
Sodium: 135 mmol/L (ref 135–145)

## 2021-12-19 LAB — CBC
HCT: 41.5 % (ref 39.0–52.0)
Hemoglobin: 14.6 g/dL (ref 13.0–17.0)
MCH: 33.3 pg (ref 26.0–34.0)
MCHC: 35.2 g/dL (ref 30.0–36.0)
MCV: 94.5 fL (ref 80.0–100.0)
Platelets: 105 10*3/uL — ABNORMAL LOW (ref 150–400)
RBC: 4.39 MIL/uL (ref 4.22–5.81)
RDW: 12.1 % (ref 11.5–15.5)
WBC: 8.7 10*3/uL (ref 4.0–10.5)
nRBC: 0 % (ref 0.0–0.2)

## 2021-12-19 LAB — RESPIRATORY PANEL BY PCR

## 2021-12-19 MED ORDER — POTASSIUM CHLORIDE 10 MEQ/100ML IV SOLN
10.0000 meq | INTRAVENOUS | Status: AC
  Administered 2021-12-19 (×4): 10 meq via INTRAVENOUS
  Filled 2021-12-19 (×4): qty 100

## 2021-12-19 MED ORDER — ENOXAPARIN SODIUM 40 MG/0.4ML IJ SOSY
40.0000 mg | PREFILLED_SYRINGE | INTRAMUSCULAR | Status: DC
  Administered 2021-12-19 – 2021-12-20 (×2): 40 mg via SUBCUTANEOUS
  Filled 2021-12-19 (×2): qty 0.4

## 2021-12-19 MED ORDER — POTASSIUM CHLORIDE 10 MEQ/100ML IV SOLN
10.0000 meq | INTRAVENOUS | Status: AC
  Administered 2021-12-19 (×3): 10 meq via INTRAVENOUS
  Filled 2021-12-19 (×3): qty 100

## 2021-12-19 MED ORDER — INSULIN ASPART 100 UNIT/ML IJ SOLN
3.0000 [IU] | Freq: Three times a day (TID) | INTRAMUSCULAR | Status: DC
  Administered 2021-12-20 – 2021-12-21 (×3): 3 [IU] via SUBCUTANEOUS
  Filled 2021-12-19 (×5): qty 1

## 2021-12-19 MED ORDER — LORAZEPAM 2 MG/ML IJ SOLN: 1.0000 mg | INTRAMUSCULAR | Status: DC | PRN

## 2021-12-19 MED ORDER — INSULIN GLARGINE-YFGN 100 UNIT/ML ~~LOC~~ SOLN
46.0000 [IU] | Freq: Every day | SUBCUTANEOUS | Status: DC
  Administered 2021-12-19: 46 [IU] via SUBCUTANEOUS
  Filled 2021-12-19 (×2): qty 0.46

## 2021-12-19 MED ORDER — MIDAZOLAM HCL 2 MG/2ML IJ SOLN: 2.0000 mg | INTRAMUSCULAR | Status: DC | PRN

## 2021-12-19 MED ORDER — INSULIN ASPART 100 UNIT/ML IJ SOLN
0.0000 [IU] | INTRAMUSCULAR | Status: DC
  Administered 2021-12-19 (×2): 3 [IU] via SUBCUTANEOUS
  Administered 2021-12-19 – 2021-12-20 (×4): 5 [IU] via SUBCUTANEOUS
  Filled 2021-12-19 (×6): qty 1

## 2021-12-19 MED ORDER — POTASSIUM CHLORIDE CRYS ER 20 MEQ PO TBCR
40.0000 meq | EXTENDED_RELEASE_TABLET | Freq: Once | ORAL | Status: AC
  Administered 2021-12-19: 40 meq via ORAL
  Filled 2021-12-19: qty 2

## 2021-12-19 NOTE — Inpatient Diabetes Management (Addendum)
Inpatient Diabetes Program Recommendations  AACE/ADA: New Consensus Statement on Inpatient Glycemic Control  Target Ranges:  Prepandial:   less than 140 mg/dL      Peak postprandial:   less than 180 mg/dL (1-2 hours)      Critically ill patients:  140 - 180 mg/dL    Latest Reference Range & Units 12/19/21 00:38 12/19/21 01:28 12/19/21 02:28 12/19/21 03:20 12/19/21 04:30 12/19/21 05:43 12/19/21 06:38  Glucose-Capillary 70 - 99 mg/dL 165 (H) 167 (H) 202 (H) 210 (H) 174 (H) 152 (H) 151 (H)    Latest Reference Range & Units 12/19/21 04:31  CO2 22 - 32 mmol/L 26  Glucose 70 - 99 mg/dL 159 (H)  Anion gap 5 - 15  8    Latest Reference Range & Units 12/18/21 03:45  CO2 22 - 32 mmol/L 9 (L)  Glucose 70 - 99 mg/dL 573 (HH)  Anion gap 5 - 15  31 (H)    Latest Reference Range & Units 12/18/21 04:06 12/18/21 08:20 12/18/21 14:18 12/18/21 18:05  Beta-Hydroxybutyric Acid 0.05 - 0.27 mmol/L 0.21 >8.00 (H) 5.91 (H) 3.55 (H)    Latest Reference Range & Units 04/15/21 09:59 12/18/21 06:08  Hemoglobin A1C 4.8 - 5.6 % 13.6 (H) 12.0 (H)   Review of Glycemic Control  Diabetes history: DM2 Outpatient Diabetes medications: Toujeo 62-64 units BID, Humalog BID with meals, Metformin 1000 mg GID, Rybelsus 7 mg daily, Farxiga 10 mg daily, Actos 45 mg daily (patient reports not sure if he is taking [place] and Actos; had some recent changes to DM medications 2 weeks ago by PCP) Current orders for Inpatient glycemic control: IV insulin  Inpatient Diabetes Program Recommendations:    Insulin: Once provider is ready to transition from IV to SQ insulin, please consider ordering Semglee 46 units Q24H (based on 92 kg x 0.5 units), CBGs Q4H, and Novolog 0-15 units Q4H.  HbgA1C:  A1C 12.0% on 12/18/21 indicating an average glucose of 298 mg/dl over the past 2-3 months.  NOTE: Patient admitted on 12/18/21 with DKA, initial glucose 573 mg/dl and patient was started on IV insulin per [facility]. Per note by Dr. Virgina Jock on  12/18/21, "presents 1 week of worsening malaise. Pt reports he started having non bloody emesis yesterday 4-5 times. He has not been taking his diabetic medications during this time period." In reviewing the chart, noted patient was inpatient 04/15/21-04/16/21 and inpatient diabetes coordinator spoke with patient on 04/15/21; patient reported that he was not taking any DM medications on a consistently basis due to his busy work schedule (working 2 jobs). Will plan to speak with patient today.  Addendum 12/19/21@12 :25-Spoke with patient at bedside. Patient sitting up in chair asleep and easily awoke to name. Patient states that his PCP manages his DM and recently made some changes with medications about 2 weeks ago. Patient states that he is suppose to take Toujeo 62-64 units BID, Humalog BID, Metformin 1000 mg BID, Rybelsus 7 mg daily, and he was not sure if he was taking Actos 45 mg daily and Farxiga 10 mg daily. Patient states that the Rybelsus was just started 2 weeks ago and he typically only takes the [place] 62-64 units once a day and the Humalog once a day. Patient reports that he was feeling poorly since Saturday 12/10/21 and he went to his PCP on 12/13/21. Patient states that he was not taking the insulin because he felt so poorly. Patient reports that he is not checking his glucose at home; he has  a FreeStyle Libre2 CGM at home but has not been using it. Inquired about why not checking glucose and patient stated, "I am working all the time and just haven't taken time to check it." Discussed the benefits of using the FreeStyle Libre CGM which would allow him to keep a close check on glucose by using the app on his phone to read the CGM sensor. Discussed A1C results (12% on 12/18/21) and explained that current A1C indicates an average glucose of 298 mg/dl over the past 2-3 months. Discussed glucose and A1C goals. Discussed importance of checking CBGs and maintaining good CBG control to prevent long-term and  short-term complications. Explained how hyperglycemia leads to damage within blood vessels which lead to the common complications seen with uncontrolled diabetes. Stressed to the patient the importance of improving glycemic control to prevent further complications from uncontrolled diabetes. Encouraged patient to start taking insulin consistently as prescribed and to talk with his PCP about referring him to an Endocrinologist to assist with improving DM control. Encouraged patient to apply the FreeStyle Libre2 CGM when he gets home so he can closely monitor his glucose. Patient states he has all needed DM supplies and medications at home.  Patient verbalized understanding of information discussed and reports no further questions at this time related to diabetes.   Thanks, Orlando Penner, RN, MSN, CDCES Diabetes Coordinator Inpatient Diabetes Program [phone] (Team Pager from 8am to 5pm)

## 2021-12-19 NOTE — Consult Note (Signed)
NAME:  CANUTO PATYK, MRN:  [id number], DOB:  02/09/1966, LOS: 1 ADMISSION DATE:  12/18/2021  REFERRING MD:  [facility] CHIEF COMPLAINT:  acidosis  BRIEF SYNOPSIS TRH CONSULTED PCCM FOR DKA  History of Present Illness:  56 y.o. male with medical history significant for Type 1 diabetes, hypertension who presents to the ED with a 1 week history of generalized malaise and a 1 day history of non bloody , non bilious vomiting.  He saw his PCP twice in the past week and tested negative for COVID on 2 occasions.   He denies cough or shortness of breath or chest pain.  He denied abdominal pain until arrival to the ED when he complained of mild burning in the epigastric area.  Denies diarrhea or dysuria.  Denies fevers or chills.  Due to generalized weakness and nausea he did not take insulin over the past week.  ED course and data review: BP 142/69 with pulse 110.  Afebrile.  Labs with glucose 573 with anion gap of 31, bicarb 9, sodium 131 and potassium 5.2.  Creatinine 1.96 above baseline of 0.5.  CBC unremarkable.  Lipase and LFTs pending.   Chest x-ray clear.  Urinalysis pending.  Patient started on IV fluid boluses for treatment of DKA along with IV insulin and hospitalist consulted for admission.        Significant Hospital Events: Including procedures, antibiotic start and stop dates in addition to other pertinent events   10/1 admitted for DKA 10/2 acidosis resolving     Micro Data:  +RHINOVIURS  Antimicrobials:   Antibiotics Given (last 72 hours)     None           Interim History / Subjective:  Asleep in chair resting comfortably Acidosis resolving       Objective   Blood pressure (!) 144/83, pulse 96, temperature 98.6 F (37 C), temperature source Oral, resp. rate 17, height 5\' 9"  (1.753 m), weight 92 kg, SpO2 93 %.        Intake/Output Summary (Last 24 hours) at 12/19/2021 0735 Last data filed at 12/19/2021 0656 Gross per 24 hour  Intake 5511.82 ml   Output 975 ml  Net 4536.82 ml   Filed Weights   12/18/21 0341 12/18/21 0840  Weight: 97.1 kg 92 kg       Review of Systems: Gen:  Denies  fever, sweats, chills weight loss  HEENT: Denies blurred vision, double vision, ear pain, eye pain, hearing loss, nose bleeds, sore throat Cardiac:  No dizziness, chest pain or heaviness, chest tightness,edema, No JVD Resp:   No cough, -sputum production, -shortness of breath,-wheezing, -hemoptysis,  Other:  All other systems negative   Physical Examination:   General Appearance: No distress  EYES PERRLA, EOM intact.   NECK Supple, No JVD Pulmonary: normal breath sounds, No wheezing.  CardiovascularNormal S1,S2.  No m/r/g.   Abdomen: Benign, Soft, non-tender. ALL OTHER ROS ARE NEGATIVE     Labs/imaging that I havepersonally reviewed  (right click and "Reselect all SmartList Selections" daily)       ASSESSMENT AND PLAN SYNOPSIS  DKA resolving Follow up insulin drip protocol IVF's as needed  CARDIAC ICU monitoring   RENAL -continue Foley Catheter-assess need -Avoid nephrotoxic agents -Follow urine output, BMP -Ensure adequate renal perfusion, optimize oxygenation -Renal dose medications   Intake/Output Summary (Last 24 hours) at 12/19/2021 0735 Last data filed at 12/19/2021 0656 Gross per 24 hour  Intake 5511.82 ml  Output 975 ml  Net 4536.82  ml       Latest Ref Rng & Units 12/19/2021    4:31 AM 12/19/2021   12:52 AM 12/18/2021    8:56 PM  BMP  Glucose 70 - 99 mg/dL 159  162  171   BUN 6 - 20 mg/dL 21  25  28    Creatinine 0.61 - 1.24 mg/dL 0.97  1.11  1.27   Sodium 135 - 145 mmol/L 140  141  140   Potassium 3.5 - 5.1 mmol/L 3.3  3.3  3.6   Chloride 98 - 111 mmol/L 106  104  104   CO2 22 - 32 mmol/L 26  26  24    Calcium 8.9 - 10.3 mg/dL 9.5  9.7  9.7     ENDO - ICU hypoglycemic\Hyperglycemia protocol -check FSBS per protocol   GI GI PROPHYLAXIS as indicated  NUTRITIONAL STATUS DIET--> as  tolerated Constipation protocol as indicated   ELECTROLYTES -follow labs as needed -replace as needed -pharmacy consultation and following      Best practice (right click and "Reselect all SmartList Selections" daily)  Code Status:  FULL Disposition:SD  Labs   CBC: Recent Labs  Lab 12/18/21 0345 12/18/21 0820 12/19/21 0431  WBC 10.0 9.1 8.7  HGB 15.5 14.7 14.6  HCT 47.4 44.4 41.5  MCV 100.9* 99.3 94.5  PLT 194 165 105*    Basic Metabolic Panel: Recent Labs  Lab 12/18/21 0406 12/18/21 0820 12/18/21 1418 12/18/21 2056 12/19/21 0052 12/19/21 0431  NA  --  136 139 140 141 140  K  --  4.0 3.9 3.6 3.3* 3.3*  CL  --  100 104 104 104 106  CO2  --  11* 17* 24 26 26   GLUCOSE  --  379* 233* 171* 162* 159*  BUN  --  39* 34* 28* 25* 21*  CREATININE  --  1.50* 1.51* 1.27* 1.11 0.97  CALCIUM  --  9.1 9.6 9.7 9.7 9.5  MG 1.2*  --   --   --   --   --    GFR: Estimated Creatinine Clearance: 97.5 mL/min (by C-G formula based on SCr of 0.97 mg/dL). Recent Labs  Lab 12/18/21 0345 12/18/21 0820 12/19/21 0431  WBC 10.0 9.1 8.7    Liver Function Tests: Recent Labs  Lab 12/18/21 0345  AST 12*  ALT 12  ALKPHOS 51  BILITOT 3.3*  PROT 8.2*  ALBUMIN 4.5   Recent Labs  Lab 12/18/21 0406  LIPASE 123*   No results for input(s): "AMMONIA" in the last 168 hours.  ABG    Component Value Date/Time   PHART 7.313 (L) 03/26/2018 0708   PCO2ART 46.5 03/26/2018 0708   PO2ART 92.0 03/26/2018 0708   HCO3 9.6 (L) 12/18/2021 0406   TCO2 25 03/26/2018 0708   ACIDBASEDEF 18.9 (H) 12/18/2021 0406   O2SAT 66.8 12/18/2021 0406     Coagulation Profile: No results for input(s): "INR", "PROTIME" in the last 168 hours.  Cardiac Enzymes: No results for input(s): "CKTOTAL", "CKMB", "CKMBINDEX", "TROPONINI" in the last 168 hours.  HbA1C: Hgb A1c MFr Bld  Date/Time Value Ref Range Status  12/18/2021 06:08 AM 12.0 (H) 4.8 - 5.6 % Final    Comment:    (NOTE) Pre diabetes:           5.7%-6.4%  Diabetes:              >6.4%  Glycemic control for   <7.0% adults with diabetes   04/15/2021 09:59 AM 13.6 (H)  4.8 - 5.6 % Final    Comment:    (NOTE)         Prediabetes: 5.7 - 6.4         Diabetes: >6.4         Glycemic control for adults with diabetes: <7.0     CBG: Recent Labs  Lab 12/19/21 0228 12/19/21 0320 12/19/21 0430 12/19/21 0543 12/19/21 0638  GLUCAP 202* 210* 174* 152* 151*     Past Medical History:  He,  has a past medical history of Diabetes mellitus without complication ([place]) and Hypertension.   Surgical History:   Past Surgical History:  Procedure Laterality Date   COLONOSCOPY WITH PROPOFOL N/A 04/16/2021   Procedure: COLONOSCOPY WITH PROPOFOL;  Surgeon: Lin Landsman, MD;  Location[facility] ENDOSCOPY;  Service: Gastroenterology;  Laterality: N/A;   none     TONSILLECTOMY       Social History:   reports that he has been smoking cigarettes. He started smoking about 24 years ago. He has a 10.00 pack-year smoking history. He has never used smokeless tobacco. He reports that he does not currently use alcohol. He reports that he does not currently use drugs.   Family History:  His family history includes Congestive Heart Failure in his father; Diabetes in his father and mother; Hypertension in his father and mother.   Allergies Allergies  Allergen Reactions   Contrast Media [Iodinated Contrast Media]     Renal failure   Other Rash and Other (See Comments)    Tested allergic to LIMA BEANS   Shellfish Allergy Rash and Other (See Comments)    TESTED ALLERGIC TO "SHELLFISH"     Home Medications  Prior to Admission medications   Medication Sig Start Date End Date Taking? Authorizing Provider  amLODipine-benazepril (LOTREL) 10-20 MG capsule Take 1 capsule by mouth every morning. 05/30/19  Yes [provider]  azithromycin (ZITHROMAX) 250 MG tablet Take 250 mg by mouth daily. 12/13/21  Yes [provider]   benzonatate (TESSALON) 100 MG capsule Take 100 mg by mouth 3 (three) times daily. 12/13/21  Yes [provider]  carvedilol (COREG) 25 MG tablet Take 25 mg by mouth 2 (two) times daily. 05/30/19  Yes [provider]  Cholecalciferol 125 MCG (5000 UT) capsule Take 5,000 Units by mouth daily. 10/20/20  Yes [provider]  dapagliflozin propanediol (FARXIGA) 10 MG TABS tablet Take 10 mg by mouth daily.   Yes [provider]  fenofibrate 160 MG tablet Take 160 mg by mouth every morning. 05/30/19  Yes [provider]  insulin lispro (HUMALOG) 100 UNIT/ML KwikPen Inject 0-45 Units into the skin 3 (three) times daily. 11/10/21  Yes [provider]  metFORMIN (GLUCOPHAGE) 1000 MG tablet Take 1,000 mg by mouth 2 (two) times daily. 05/14/19  Yes [provider]  pioglitazone (ACTOS) 45 MG tablet Take 45 mg by mouth every morning. 12/09/20  Yes [provider]  rosuvastatin (CRESTOR) 40 MG tablet Take 40 mg by mouth every evening. 01/10/21  Yes [provider]  RYBELSUS 7 MG TABS Take 7 mg by mouth daily. 12/15/21  Yes [provider]  acetaminophen (TYLENOL) 325 MG tablet Take 2 tablets (650 mg total) by mouth every 6 (six) hours as needed for mild pain or fever. 04/16/21   Emeterio Reeve, DO  Cholecalciferol (VITAMIN D3) 125 MCG (5000 UT) CAPS Take 5,000 Units by mouth daily. Patient not taking: Reported on 12/19/2021    [provider]  gabapentin (NEURONTIN) 300 MG capsule Take 300 mg by mouth 3 (three) times daily. Patient not taking: Reported on 12/18/2021 06/16/19   [provider]  icosapent Ethyl (VASCEPA) 1 g capsule Take 2 g by mouth 2 (two) times daily. Patient not taking: Reported on 12/18/2021    [provider]  insulin aspart (NOVOLOG) 100 UNIT/ML FlexPen Use TID with meals and bedtime: CBG 121-150: 3u; CBG 151-200: 4u; CBG 201-250: 7u; CBG 251-300: 11u; CBG 301- 350: 15u; CBG 351-400:  20u Patient not taking: Reported on 12/18/2021 03/30/18   Cristal Ford, DO  insulin glargine, 2 Unit Dial, (TOUJEO MAX SOLOSTAR) 300 UNIT/ML Solostar Pen Inject 100 Units into the skin. Patient not taking: Reported on 12/18/2021    [provider]  sildenafil (VIAGRA) 100 MG tablet Take 100 mg by mouth daily as needed. 06/16/19   [provider]      Corrin Parker, M.D.  [facility]  Medical Director [place] Director [facility] Cardio-Pulmonary Department

## 2021-12-19 NOTE — Progress Notes (Signed)
  Progress Note   Patient: Randy Deleon:[id number] DOB: 19-Jun-1965 DOA: 12/18/2021     1 DOS: the patient was seen and examined on 12/19/2021   Brief hospital course: 56 year old male admitted for DKA  10/1: On insulin drip, GI consult for possible hematemesis, PCCM following for critical care management 10/2: Switch to subcu insulin as acidosis resolved.  Transfer to floors   Assessment and Plan: * DKA, type 1 ([place]) Transitioned to subcutaneous insulin as the anion gap closed Continue insulin Semglee and sliding scale.  Add meal coverage NovoLog   AKI (acute kidney injury) ([place]) Improved with hydration, likely prerenal  Lab Results  Component Value Date   CREATININE 0.95 12/19/2021   CREATININE 0.97 12/19/2021   CREATININE 1.11 12/19/2021    HTN (hypertension) IV hydralazine as needed until able to tolerate orally consistent.  Continue Coreg        Subjective: Feeling better now.  Not vomiting or nauseous  Physical Exam: Vitals:   12/19/21 1200 12/19/21 1600 12/19/21 1800 12/19/21 2000  BP: (!) 162/88 135/71 135/71 (!) 148/73  Pulse: 86 90  88  Resp: 15 15  17   Temp: 98.6 F (37 C)   98.8 F (37.1 C)  TempSrc: Oral   Oral  SpO2: 92% 92%  98%  Weight:      Height:       56 year old male lying in the bed comfortably without any acute distress Lungs clear to auscultation bilaterally Heart regular rate and rhythm Abdomen soft, benign Neuro alert and awake, nonfocal Skin no rash or lesion Data Reviewed:  Potassium 3.4  Family Communication: None  Disposition: Status is: Inpatient Remains inpatient appropriate because: Monitoring of blood sugar    Planned Discharge Destination: Home    DVT prophylaxis-Lovenox Time spent: 35 minutes  Author: Max Sane, MD 12/19/2021 8:57 PM  For on call review [email or web address].

## 2021-12-19 NOTE — Plan of Care (Signed)
ICU resolved, Acidosis is improving time, fluids and insulin infusion   Vital signs reviewed, ICU needs resolved  Will sign off at this time. No further recommendations at this time.  Please call [phone] for further questions. Thank you.    Corrin Parker, M.D.  [facility]  Medical Director [place] Director [facility] Cardio-Pulmonary Department

## 2021-12-19 NOTE — TOC Initial Note (Signed)
Transition of Care [facility]) - Initial/Assessment Note    Patient Details  Name: Randy Deleon MRN: [id number] Date of Birth: 09-May-1965  Transition of [facility]) CM/SW Contact:    Shelbie Hutching, RN Phone Number: 12/19/2021, 2:15 PM  Clinical Narrative:                 [facility] consult for substance abuse counseling, in H&P patient denies ETOH or drug use.  TOC consult completed.    Expected Discharge Plan: Home/Self Care Barriers to Discharge: Continued Medical Work up   Patient Goals and CMS Choice        Expected Discharge Plan and Services Expected Discharge Plan: Home/Self Care       Living arrangements for the past 2 months: Single Family Home                                      Prior Living Arrangements/Services Living arrangements for the past 2 months: Single Family Home                     Activities of Daily Living Home Assistive Devices/Equipment: None ADL Screening (condition at time of admission) Patient's cognitive ability adequate to safely complete daily activities?: Yes Is the patient deaf or have difficulty hearing?: No Does the patient have difficulty seeing, even when wearing glasses/contacts?: Yes Does the patient have difficulty concentrating, remembering, or making decisions?: No Patient able to express need for assistance with ADLs?: Yes Does the patient have difficulty dressing or bathing?: No Independently performs ADLs?: Yes (appropriate for developmental age) Does the patient have difficulty walking or climbing stairs?: No Weakness of Legs: None Weakness of Arms/Hands: None  Permission Sought/Granted                  Emotional Assessment       Orientation: : Oriented to Self, Oriented to Place, Oriented to  Time, Oriented to Situation Alcohol / Substance Use: Tobacco Use Psych Involvement: No (comment)  Admission diagnosis:  Dehydration [E86.0] Generalized weakness [R53.1] AKI (acute kidney injury) ([place])  [N17.9] DKA, type 1 ([place]) [E10.10] Diabetic ketoacidosis without coma associated with type 1 diabetes mellitus ([place]) [E10.10] Patient Active Problem List   Diagnosis Date Noted   Dehydration    Polyp of colon    Hyperosmolar hyperglycemic state (HHS) ([place]) 04/15/2021   Diabetes mellitus without complication ([place]) 58/11/9831   Tobacco use 04/15/2021   HLD (hyperlipidemia) 04/15/2021   HTN (hypertension) 04/15/2021   AKI (acute kidney injury) ([place]) 03/23/2018   Acute pancreatitis 03/23/2018   Pancreatitis, alcoholic, acute 82/50/5397   Respiratory failure, acute ([place]) 03/21/2018   Diabetic ketoacidosis without coma associated with type 1 diabetes mellitus ([place]) 03/20/2018   DKA, type 1 ([place]) 11/23/2017   Fatigue 11/19/2017   Abnormal weight loss 11/19/2017   PCP:  [name], MD Pharmacy:   [id number] - Closed - [place], [place] - [address] [address] Phone: [phone] Fax: [phone]  [place], [place] - [place] [facility] [address] [place] [address] Phone: [phone] Fax: [phone]     Social Determinants of Health ([place]) Interventions    Readmission Risk Interventions     No data to display

## 2021-12-20 DIAGNOSIS — E860 Dehydration: Secondary | ICD-10-CM | POA: Diagnosis not present

## 2021-12-20 DIAGNOSIS — E1010 Type 1 diabetes mellitus with ketoacidosis without coma: Secondary | ICD-10-CM | POA: Diagnosis not present

## 2021-12-20 DIAGNOSIS — N179 Acute kidney failure, unspecified: Secondary | ICD-10-CM | POA: Diagnosis not present

## 2021-12-20 LAB — GLUCOSE, CAPILLARY
Glucose-Capillary: 227 mg/dL — ABNORMAL HIGH (ref 70–99)
Glucose-Capillary: 226 mg/dL — ABNORMAL HIGH (ref 70–99)
Glucose-Capillary: 218 mg/dL — ABNORMAL HIGH (ref 70–99)
Glucose-Capillary: 234 mg/dL — ABNORMAL HIGH (ref 70–99)
Glucose-Capillary: 243 mg/dL — ABNORMAL HIGH (ref 70–99)
Glucose-Capillary: 227 mg/dL — ABNORMAL HIGH (ref 70–99)

## 2021-12-20 MED ORDER — INSULIN ASPART 100 UNIT/ML IJ SOLN
0.0000 [IU] | Freq: Three times a day (TID) | INTRAMUSCULAR | Status: DC
  Administered 2021-12-20: 5 [IU] via SUBCUTANEOUS
  Administered 2021-12-20: 8 [IU] via SUBCUTANEOUS
  Filled 2021-12-20 (×2): qty 1

## 2021-12-20 MED ORDER — ACETAMINOPHEN 325 MG PO TABS
650.0000 mg | ORAL_TABLET | Freq: Four times a day (QID) | ORAL | Status: DC | PRN
  Administered 2021-12-20 – 2021-12-21 (×3): 650 mg via ORAL
  Filled 2021-12-20 (×3): qty 2

## 2021-12-20 MED ORDER — INSULIN GLARGINE-YFGN 100 UNIT/ML ~~LOC~~ SOLN
60.0000 [IU] | Freq: Every day | SUBCUTANEOUS | Status: DC
  Administered 2021-12-20: 60 [IU] via SUBCUTANEOUS
  Filled 2021-12-20 (×2): qty 0.6

## 2021-12-20 MED ORDER — GUAIFENESIN-DM 100-10 MG/5ML PO SYRP
5.0000 mL | ORAL_SOLUTION | ORAL | Status: DC | PRN
  Administered 2021-12-20: 5 mL via ORAL
  Filled 2021-12-20: qty 10

## 2021-12-20 MED ORDER — LIDOCAINE 5 % EX PTCH
1.0000 | MEDICATED_PATCH | CUTANEOUS | Status: DC
  Administered 2021-12-20: 1 via TRANSDERMAL
  Filled 2021-12-20: qty 1

## 2021-12-20 MED ORDER — INSULIN ASPART 100 UNIT/ML IJ SOLN
0.0000 [IU] | Freq: Three times a day (TID) | INTRAMUSCULAR | Status: DC
  Administered 2021-12-21: 5 [IU] via SUBCUTANEOUS
  Administered 2021-12-21: 3 [IU] via SUBCUTANEOUS
  Filled 2021-12-20 (×3): qty 1

## 2021-12-20 NOTE — Evaluation (Signed)
Physical Therapy Evaluation Patient Details Name: Randy Deleon MRN: [id number] DOB: 1965/07/22 Today's Date: 12/20/2021  History of Present Illness  presented to ER secondary to progressive weakness, nausea/vomiting; admitted for management of DKA  Clinical Impression  Patient resting in bed upon arrival to room; alert and oriented, follows commands and agreeable to participation with session.  Denies pain, and notes improvement in nausea/vomiting since admission.  Very soft-spoken in communication; limited spontaneous conversation.  Bilat UE/LE strength and ROM grossly symmetrical and WFL; no focal weakness appreciated.  Able to complete bed mobility with mod indep; sit/stand, basic transfers and gait (150') without assist device, cga/close sup.  Demonstrates reciprocal stepping pattern with fair step height/length; slow and guarded; limited trunk rotation and arm swing, but no overt buckling or LOB.  Fatigues quickly, but anticipate consistent progression towards mobility goals as medical status optimized. Would benefit from skilled PT to address above deficits and promote optimal return to PLOF.; will maintain on caseload throughout remaining stay (x1-2 additional visits) to ensure progressive mobilization throughout remainder of hospital stay.  Will engage with mobility specialist team for additional opportunity as appropriate.       Recommendations for follow up therapy are one component of a multi-disciplinary discharge planning process, led by the attending physician.  Recommendations may be updated based on patient status, additional functional criteria and insurance authorization.  Follow Up Recommendations No PT follow up      Assistance Recommended at Discharge PRN  Patient can return home with the following  A little help with walking and/or transfers;A little help with bathing/dressing/bathroom    Equipment Recommendations    Recommendations for Other Services        Functional Status Assessment Patient has had a recent decline in their functional status and demonstrates the ability to make significant improvements in function in a reasonable and predictable amount of time.     Precautions / Restrictions Precautions Precautions: None Restrictions Weight Bearing Restrictions: No      Mobility  Bed Mobility Overal bed mobility: Modified Independent                  Transfers Overall transfer level: Needs assistance Equipment used: None Transfers: Sit to/from Stand, Bed to chair/wheelchair/BSC Sit to Stand: Supervision Stand pivot transfers: Supervision              Ambulation/Gait   Gait Distance (Feet): 150 Feet Assistive device: None         General Gait Details: reciprocal stepping pattern with fair step height/length; slow and guarded; limited trunk rotation and arm swing, but no overt buckling or LOB.  Stairs            Wheelchair Mobility    Modified Rankin (Stroke Patients Only)       Balance Overall balance assessment: Needs assistance Sitting-balance support: No upper extremity supported, Feet supported Sitting balance-Leahy Scale: Normal     Standing balance support: No upper extremity supported Standing balance-Leahy Scale: Good                               Pertinent Vitals/Pain Pain Assessment Pain Assessment: No/denies pain    Home Living Family/patient expects to be discharged to:: Private residence Living Arrangements: Parent Available Help at Discharge: Family;Available PRN/intermittently Type of Home: House Home Access: Stairs to enter   Entrance Stairs-Number of Steps: 1-2   Home Layout: One level Home Equipment: None  Prior Function Prior Level of Function : Independent/Modified Independent             Mobility Comments: Indep with ADLs, household and community mobilization upon discharge; denies fall history.  Working full-time job (x2) in  Teacher, English as a foreign language Dominance   Dominant Hand: Right    Extremity/Trunk Assessment   Upper Extremity Assessment Upper Extremity Assessment: Overall WFL for tasks assessed    Lower Extremity Assessment Lower Extremity Assessment: Overall WFL for tasks assessed (grossly 4/5 throughout)       Communication   Communication:  (very soft-spoken)  Cognition Arousal/Alertness: Awake/alert Behavior During Therapy: WFL for tasks assessed/performed Overall Cognitive Status: Within Functional Limits for tasks assessed                                          General Comments      Exercises     Assessment/Plan    PT Assessment Patient needs continued PT services  PT Problem List Decreased strength;Decreased activity tolerance;Decreased balance;Decreased mobility;Decreased knowledge of use of DME;Decreased safety awareness;Decreased knowledge of precautions       PT Treatment Interventions DME instruction;Gait training;Stair training;Functional mobility training;Therapeutic activities;Patient/family education;Balance training;Therapeutic exercise    PT Goals (Current goals can be found in the Care Plan section)  Acute Rehab PT Goals Patient Stated Goal: to return home PT Goal Formulation: With patient Time For Goal Achievement: 01/03/22 Potential to Achieve Goals: Good    Frequency Min 2X/week     Co-evaluation               AM-PAC PT "6 Clicks" Mobility  Outcome Measure Help needed turning from your back to your side while in a flat bed without using bedrails?: None Help needed moving from lying on your back to sitting on the side of a flat bed without using bedrails?: None Help needed moving to and from a bed to a chair (including a wheelchair)?: None Help needed standing up from a chair using your arms (e.g., wheelchair or bedside chair)?: None Help needed to walk in hospital room?: A Little Help needed climbing 3-5 steps with a  railing? : A Little 6 Click Score: 22    End of Session Equipment Utilized During Treatment: Gait belt Activity Tolerance: Patient tolerated treatment well Patient left: in chair;with call bell/phone within reach Nurse Communication: Mobility status PT Visit Diagnosis: Muscle weakness (generalized) (M62.81);Difficulty in walking, not elsewhere classified (R26.2)    Time: 7106-2694 PT Time Calculation (min) (ACUTE ONLY): 15 min   Charges:   PT Evaluation $PT Eval Low Complexity: 1 Low          Ginevra Tacker H. Owens Shark, PT, DPT, NCS 12/20/21, 11:30 AM [phone]

## 2021-12-20 NOTE — Assessment & Plan Note (Signed)
Improved with hydration, likely prerenal  Lab Results  Component Value Date   CREATININE 0.95 12/19/2021   CREATININE 0.97 12/19/2021   CREATININE 1.11 12/19/2021

## 2021-12-20 NOTE — Progress Notes (Signed)
Remains in NSR and stable all day. Still awaiting a floor bed.

## 2021-12-20 NOTE — Progress Notes (Signed)
  Progress Note   Patient: Randy Deleon ION:[id number] DOB: Nov 26, 1965 DOA: 12/18/2021     2 DOS: the patient was seen and examined on 12/20/2021   Brief hospital course: 56 year old male admitted for DKA  10/1: On insulin drip, GI consult for possible hematemesis, PCCM following for critical care management 10/2: Switch to subcu insulin as acidosis resolved.  Transfer to floors 10/3: Increasing insulin for better blood sugar control   Assessment and Plan: * DKA, type 1 ([place]) Transitioned to subcutaneous insulin as the anion gap closed on 10/2  Increase insulin Semglee to 60 units for better blood sugar control.  Continue sliding scale and meal coverage NovoLog   AKI (acute kidney injury) ([place]) Improved with hydration, likely prerenal  Lab Results  Component Value Date   CREATININE 0.95 12/19/2021   CREATININE 0.97 12/19/2021   CREATININE 1.11 12/19/2021    HTN (hypertension) Continue Coreg, as needed hydralazine        Subjective: Sitting in the bed.  Feels weak  Physical Exam: Vitals:   12/20/21 0432 12/20/21 0749 12/20/21 0800 12/20/21 1200  BP: 132/72 (!) 144/87 (!) 140/69 136/69  Pulse: 88 89 77 81  Resp: 20  19 17   Temp: 98.2 F (36.8 C)  98.7 F (37.1 C) 98.1 F (36.7 C)  TempSrc: Oral  Oral Oral  SpO2: 93%  93% 94%  Weight:      Height:       56 year old male lying in the bed comfortably without any acute distress Lungs clear to auscultation bilaterally Heart regular rate and rhythm Abdomen soft, benign Neuro alert and awake, nonfocal Skin no rash or lesion Data Reviewed:  Blood sugars in 200.  Potassium 3.4  Family Communication: None  Disposition: Status is: Inpatient Remains inpatient appropriate because: Getting blood sugar under better control   Planned Discharge Destination: Home    DVT prophylaxis-Lovenox Time spent: 35 minutes  Author: Max Sane, MD 12/20/2021 1:31 PM  For on call review [email or web address].

## 2021-12-20 NOTE — Assessment & Plan Note (Signed)
Transitioned to subcutaneous insulin as the anion gap closed on 10/2  Increase insulin Semglee to 60 units for better blood sugar control.  Continue sliding scale and meal coverage NovoLog

## 2021-12-20 NOTE — Progress Notes (Signed)
       CROSS COVER NOTE  NAME: Randy Deleon MRN: [id number] DOB : 1965/12/09    Date of Service   12/20/2021   HPI/Events of Note   Notified of 4/10 chronic back pain unrelieved by tylenol  Interventions   Assessment/Plan:   Lidocaine patch     This document was prepared using Dragon voice recognition software and may include unintentional dictation errors.  Neomia Glass DNP, MHA, FNP-BC Nurse Practitioner [facility] Pager [phone]

## 2021-12-20 NOTE — Assessment & Plan Note (Signed)
Continue Coreg, as needed hydralazine

## 2021-12-20 NOTE — Inpatient Diabetes Management (Signed)
Inpatient Diabetes Program Recommendations  AACE/ADA: New Consensus Statement on Inpatient Glycemic Control   Target Ranges:  Prepandial:   less than 140 mg/dL      Peak postprandial:   less than 180 mg/dL (1-2 hours)      Critically ill patients:  140 - 180 mg/dL    Latest Reference Range & Units 12/20/21 03:56  Glucose-Capillary 70 - 99 mg/dL 227 (H)  Novolog 5 units    Latest Reference Range & Units 12/19/21 10:28 12/19/21 11:25 12/19/21 12:27 12/19/21 13:54 12/19/21 15:18 12/19/21 19:42 12/19/21 23:26  Glucose-Capillary 70 - 99 mg/dL 184 (H) 139 (H)    Semglee 46 units  180 (H) 152 (H)  Novolog 3 units 156 (H)  Novolog 3 units 222 (H)  Novolog 5 units 213 (H)  Novolog 5 units   Review of Glycemic Control  Diabetes history: DM2 Outpatient Diabetes medications: Toujeo 62-64 units BID, Humalog BID with meals, Metformin 1000 mg GID, Rybelsus 7 mg daily, Farxiga 10 mg daily, Actos 45 mg daily (patient reports not sure if he is taking [place] and Actos; had some recent changes to DM medications 2 weeks ago by PCP) Current orders for Inpatient glycemic control: Semglee 46 units daily, Novolog 3 units TID with meals, Novolog 0-15 units Q4H  Inpatient Diabetes Program Recommendations:    Insulin: Please consider increasing Semglee to 60 units daily and change CBGs and Novolog to 0-15 units AC&HS.  Thanks, Barnie Alderman, RN, MSN, [place] [facility] Coordinator Inpatient Diabetes Program [phone] (Team Pager from 8am to [place])

## 2021-12-20 NOTE — Progress Notes (Signed)
OT Cancellation Note  Patient Details Name: Randy Deleon MRN: [id number] DOB: Oct 28, 1965   Cancelled Treatment:    Reason Eval/Treat Not Completed: OT screened, no needs identified, will sign off. Order received, chart reviewed. Per conversation with PT, pt back to baseline functional independence. No skilled OT needs identified. Will sign off. Please re-consult if additional needs arise.   Dessie Coma, M.S. OTR/L  12/20/21, 11:32 AM  ascom [phone]

## 2021-12-21 DIAGNOSIS — R531 Weakness: Secondary | ICD-10-CM

## 2021-12-21 LAB — GLUCOSE, CAPILLARY
Glucose-Capillary: 198 mg/dL — ABNORMAL HIGH (ref 70–99)
Glucose-Capillary: 234 mg/dL — ABNORMAL HIGH (ref 70–99)

## 2021-12-21 MED ORDER — INSULIN ASPART 100 UNIT/ML IJ SOLN
6.0000 [IU] | Freq: Three times a day (TID) | INTRAMUSCULAR | Status: DC
  Administered 2021-12-21: 6 [IU] via SUBCUTANEOUS
  Filled 2021-12-21: qty 1

## 2021-12-21 MED ORDER — CARVEDILOL 6.25 MG PO TABS: 6.2500 mg | ORAL_TABLET | Freq: Two times a day (BID) | ORAL | 1 refills | Status: DC

## 2021-12-21 MED ORDER — TOUJEO MAX SOLOSTAR 300 UNIT/ML ~~LOC~~ SOPN: 35.0000 [IU] | PEN_INJECTOR | Freq: Two times a day (BID) | SUBCUTANEOUS | 1 refills | Status: DC

## 2021-12-21 MED ORDER — LIDOCAINE 5 % EX PTCH: 1.0000 | MEDICATED_PATCH | CUTANEOUS | 0 refills | Status: DC

## 2021-12-21 MED ORDER — INSULIN GLARGINE-YFGN 100 UNIT/ML ~~LOC~~ SOLN
32.0000 [IU] | Freq: Two times a day (BID) | SUBCUTANEOUS | Status: DC
  Administered 2021-12-21: 32 [IU] via SUBCUTANEOUS
  Filled 2021-12-21 (×2): qty 0.32

## 2021-12-21 MED ORDER — GUAIFENESIN-DM 100-10 MG/5ML PO SYRP: 5.0000 mL | ORAL_SOLUTION | ORAL | 0 refills | Status: DC | PRN

## 2021-12-21 NOTE — Inpatient Diabetes Management (Signed)
Inpatient Diabetes Program Recommendations  AACE/ADA: New Consensus Statement on Inpatient Glycemic Control   Target Ranges:  Prepandial:   less than 140 mg/dL      Peak postprandial:   less than 180 mg/dL (1-2 hours)      Critically ill patients:  140 - 180 mg/dL    Latest Reference Range & Units 12/20/21 07:36 12/20/21 12:15 12/20/21 16:54 12/20/21 19:52 12/20/21 21:59 12/21/21 07:36  Glucose-Capillary 70 - 99 mg/dL 226 (H) 218 (H) 234 (H) 243 (H) 227 (H) 198 (H)   Review of Glycemic Control  Diabetes history: DM2 Outpatient Diabetes medications: Toujeo 62-64 units BID, Humalog BID with meals, Metformin 1000 mg GID, Rybelsus 7 mg daily, Farxiga 10 mg daily, Actos 45 mg daily (patient reports not sure if he is taking [place] and Actos; had some recent changes to DM medications 2 weeks ago by PCP) Current orders for Inpatient glycemic control: Semglee 60 units daily, Novolog 3 units TID with meals, Novolog 0-15 units AC&HS   Inpatient Diabetes Program Recommendations:     Insulin: Please consider increasing Semglee 63 units daily and meal coverage to Novolog 6 units TID with meals.  Thanks, Barnie Alderman, RN, MSN, [place] [facility] Coordinator Inpatient Diabetes Program [phone] (Team Pager from 8am to [place])

## 2021-12-21 NOTE — Progress Notes (Signed)
Discharge instructions given with verbalized understanding.

## 2021-12-21 NOTE — Discharge Summary (Signed)
Physician Discharge Summary   Patient: Randy Deleon MRN: [id number] DOB: 03-08-1966  Admit date:     12/18/2021  Discharge date: 12/21/21  Discharge Physician: Lorella Nimrod   PCP: [name], MD   Recommendations at discharge:  Please obtain CBC and BMP in 1 week Please encourage using home insulin regularly Follow-up with primary care provider within a week.  Discharge Diagnoses: Principal Problem:   DKA, type 2 ([place]) Active Problems:   AKI (acute kidney injury) ([place])   HTN (hypertension)   Generalized weakness   Diabetic ketoacidosis without coma associated with type 1 diabetes mellitus ([place])   Dehydration   Hospital Course: 56 year old male admitted for DKA  10/1: On insulin drip, GI consult for possible hematemesis, PCCM following for critical care management 10/2: Switch to subcu insulin as acidosis resolved.  Transfer to floors 10/3: Increasing insulin for better blood sugar control.  10/4: CBG remained elevated.  Semglee switched to 32 units twice daily instead of 60 units daily and mealtime coverage increased to 6 unit.  Patient remained stable.  Wants to go home.  Patient has type 2 diabetes and was not taking his home insulin as directed.  He was instructed to restart his home insulin along with his other medications and have a close follow-up with PCP for further recommendations and better control of diabetes. We made his home glargine to twice daily and he will continue taking Humalog as he was instructed before with a sliding scale. Patient was also instructed to continue checking his blood glucose level regularly.  Patient will continue with the rest of his home medications and follow-up with his providers for further recommendations.  Assessment and Plan: * DKA, type 2 (Randy Deleon) Transitioned to subcutaneous insulin as the anion gap closed on 10/2  Increase insulin Semglee to 60 units for better blood sugar control.  Continue sliding scale and meal  coverage NovoLog   AKI (acute kidney injury) (Randy Deleon) Improved with hydration, likely prerenal  Lab Results  Component Value Date   CREATININE 0.95 12/19/2021   CREATININE 0.97 12/19/2021   CREATININE 1.11 12/19/2021    HTN (hypertension) Continue Coreg, as needed hydralazine   Consultants: None Procedures performed: None Disposition: Home Diet recommendation:  Discharge Diet Orders (From admission, onward)     Start     Ordered   12/21/21 0000  Diet - low sodium heart healthy        12/21/21 1211           Cardiac and Carb modified diet DISCHARGE MEDICATION: Allergies as of 12/21/2021       Reactions   Contrast Media [iodinated Contrast Media]    Renal failure   Other Rash, Other (See Comments)   Tested allergic to LIMA BEANS   Shellfish Allergy Rash, Other (See Comments)   TESTED ALLERGIC TO "SHELLFISH"        Medication List     STOP taking these medications    azithromycin 250 MG tablet Commonly known as: ZITHROMAX   benzonatate 100 MG capsule Commonly known as: TESSALON   insulin aspart 100 UNIT/ML FlexPen Commonly known as: NOVOLOG       TAKE these medications    acetaminophen 325 MG tablet Commonly known as: TYLENOL Take 2 tablets (650 mg total) by mouth every 6 (six) hours as needed for mild pain or fever.   amLODipine-benazepril 10-20 MG capsule Commonly known as: LOTREL Take 1 capsule by mouth every morning.   carvedilol 6.25 MG tablet Commonly known  as: COREG Take 1 tablet (6.25 mg total) by mouth 2 (two) times daily with a meal. What changed:  medication strength how much to take when to take this   Cholecalciferol 125 MCG (5000 UT) capsule Take 5,000 Units by mouth daily.   dapagliflozin propanediol 10 MG Tabs tablet Commonly known as: FARXIGA Take 10 mg by mouth daily.   fenofibrate 160 MG tablet Take 160 mg by mouth every morning.   guaiFENesin-dextromethorphan 100-10 MG/5ML syrup Commonly known as: ROBITUSSIN  DM Take 5 mLs by mouth every 4 (four) hours as needed for cough.   insulin lispro 100 UNIT/ML KwikPen Commonly known as: HUMALOG Inject 0-45 Units into the skin 3 (three) times daily.   lidocaine 5 % Commonly known as: LIDODERM Place 1 patch onto the skin daily. Remove & Discard patch within 12 hours or as directed by MD   metFORMIN 1000 MG tablet Commonly known as: GLUCOPHAGE Take 1,000 mg by mouth 2 (two) times daily.   pioglitazone 45 MG tablet Commonly known as: ACTOS Take 45 mg by mouth every morning.   rosuvastatin 40 MG tablet Commonly known as: CRESTOR Take 40 mg by mouth every evening.   Rybelsus 7 MG Tabs Generic drug: Semaglutide Take 7 mg by mouth daily.   sildenafil 100 MG tablet Commonly known as: VIAGRA Take 100 mg by mouth daily as needed.   Toujeo Max SoloStar 300 UNIT/ML Solostar Pen Generic drug: insulin glargine (2 Unit Dial) Inject 35 Units into the skin 2 (two) times daily. What changed:  how much to take when to take this        Follow-up Information     [name], MD Follow up in 1 week(s).   Specialty: Internal Medicine Contact information: [address] [place] [address] [phone]                Discharge Exam: Randy Deleon Weights   12/18/21 0341 12/18/21 0840  Weight: 97.1 kg 92 kg   General.  Well-developed gentleman, in no acute distress. Pulmonary.  Lungs clear bilaterally, normal respiratory effort. CV.  Regular rate and rhythm, no JVD, rub or murmur. Abdomen.  Soft, nontender, nondistended, BS positive. CNS.  Alert and oriented .  No focal neurologic deficit. Extremities.  No edema, no cyanosis, pulses intact and symmetrical. Psychiatry.  Judgment and insight appears normal.   Condition at discharge: stable  The results of significant diagnostics from this hospitalization (including imaging, microbiology, ancillary and laboratory) are listed below for reference.   Imaging Studies: DG Chest Portable  1 View  Result Date: 12/18/2021 CLINICAL DATA:  Weakness for a week.  Vomiting yesterday. EXAM: PORTABLE CHEST 1 VIEW COMPARISON:  03/25/2018 FINDINGS: Normal heart size and mediastinal contours. No acute infiltrate or edema. No effusion or pneumothorax. No acute osseous findings. IMPRESSION: Negative portable chest. Electronically Signed   By: Jorje Guild M.D.   On: 12/18/2021 04:40    Microbiology: Results for orders placed or performed during the hospital encounter of 12/18/21  Blood culture (routine x 2)     Status: None (Preliminary result)   Collection Time: 12/18/21  4:28 AM   Specimen: BLOOD  Result Value Ref Range Status   Specimen Description BLOOD BLOOD RIGHT ARM  Final   Special Requests   Final    BOTTLES DRAWN AEROBIC AND ANAEROBIC Blood Culture adequate volume   Culture   Final    NO GROWTH 3 DAYS Performed at [facility], [address] [place], [place]  [address]    Report Status PENDING  Incomplete  MRSA Next Gen by PCR, Nasal     Status: None   Collection Time: 12/18/21  8:43 AM   Specimen: Nasal Mucosa; Nasal Swab  Result Value Ref Range Status   MRSA by PCR Next Gen NOT DETECTED NOT DETECTED Final    Comment: (NOTE) The GeneXpert MRSA Assay (FDA approved for NASAL specimens only), is one component of a comprehensive MRSA colonization surveillance program. It is not intended to diagnose MRSA infection nor to guide or monitor treatment for MRSA infections. Test performance is not FDA approved in patients less than 75 years old. Performed at [facility], [address] [place], [place] [address]   Blood culture (routine x 2)     Status: None (Preliminary result)   Collection Time: 12/18/21  9:04 AM   Specimen: BLOOD LEFT ARM  Result Value Ref Range Status   Specimen Description BLOOD LEFT ARM  Final   Special Requests   Final    BOTTLES DRAWN AEROBIC AND ANAEROBIC Blood Culture adequate volume   Culture   Final    NO GROWTH 3  DAYS Performed at [facility], [address] [place], [place] [address]    Report Status PENDING  Incomplete  SARS Coronavirus 2 by RT PCR (hospital order, performed in [place] hospital lab) *cepheid single result test* Anterior Nasal Swab     Status: None   Collection Time: 12/18/21  5:10 PM   Specimen: Anterior Nasal Swab  Result Value Ref Range Status   SARS Coronavirus 2 by RT PCR NEGATIVE NEGATIVE Final    Comment: (NOTE) SARS-CoV-2 target nucleic acids are NOT DETECTED.  The SARS-CoV-2 RNA is generally detectable in upper and lower respiratory specimens during the acute phase of infection. The lowest concentration of SARS-CoV-2 viral copies this assay can detect is 250 copies / mL. A negative result does not preclude SARS-CoV-2 infection and should not be used as the sole basis for treatment or other patient management decisions.  A negative result may occur with improper specimen collection / handling, submission of specimen other than nasopharyngeal swab, presence of viral mutation(s) within the areas targeted by this assay, and inadequate number of viral copies (<250 copies / mL). A negative result must be combined with clinical observations, patient history, and epidemiological information.  Fact Sheet for Patients:   https://www.patel.info/  Fact Sheet for Healthcare Providers: https://hall.com/  This test is not yet approved or  cleared by the [place] FDA and has been authorized for detection and/or diagnosis of SARS-CoV-2 by FDA under an Emergency Use Authorization (EUA).  This EUA will remain in effect (meaning this test can be used) for the duration of the COVID-19 declaration under Section 564(b)(1) of the Act, 21 U.S.C. section 360bbb-3(b)(1), unless the authorization is terminated or revoked sooner.  Performed at [facility], [place], [place] [address]   Respiratory  (~20 pathogens) panel by PCR     Status: Abnormal   Collection Time: 12/18/21  5:10 PM   Specimen: Nasopharyngeal Swab; Respiratory  Result Value Ref Range Status   Adenovirus NOT DETECTED NOT DETECTED Final   Coronavirus 229E NOT DETECTED NOT DETECTED Final    Comment: (NOTE) The Coronavirus on the Respiratory Panel, DOES NOT test for the novel  Coronavirus (2019 nCoV)    Coronavirus HKU1 NOT DETECTED NOT DETECTED Final   Coronavirus NL63 NOT DETECTED NOT DETECTED Final   Coronavirus OC43 NOT DETECTED  NOT DETECTED Final   Metapneumovirus NOT DETECTED NOT DETECTED Final   Rhinovirus / Enterovirus DETECTED (A) NOT DETECTED Final   Influenza A NOT DETECTED NOT DETECTED Final   Influenza B NOT DETECTED NOT DETECTED Final   Parainfluenza Virus 1 NOT DETECTED NOT DETECTED Final   Parainfluenza Virus 2 NOT DETECTED NOT DETECTED Final   Parainfluenza Virus 3 NOT DETECTED NOT DETECTED Final   Parainfluenza Virus 4 NOT DETECTED NOT DETECTED Final   Respiratory Syncytial Virus NOT DETECTED NOT DETECTED Final   Bordetella pertussis NOT DETECTED NOT DETECTED Final   Bordetella Parapertussis NOT DETECTED NOT DETECTED Final   Chlamydophila pneumoniae NOT DETECTED NOT DETECTED Final   Mycoplasma pneumoniae NOT DETECTED NOT DETECTED Final    Comment: Performed at [facility], [place] [address] [place], [place] [address]    Labs: CBC: Recent Labs  Lab 12/18/21 0345 12/18/21 0820 12/19/21 0431  WBC 10.0 9.1 8.7  HGB 15.5 14.7 14.6  HCT 47.4 44.4 41.5  MCV 100.9* 99.3 94.5  PLT 194 165 267*   Basic Metabolic Panel: Recent Labs  Lab 12/18/21 0406 12/18/21 0820 12/18/21 1418 12/18/21 2056 12/19/21 0052 12/19/21 0431 12/19/21 0907  NA  --    < > 139 140 141 140 135  K  --    < > 3.9 3.6 3.3* 3.3* 3.4*  CL  --    < > 104 104 104 106 103  CO2  --    < > 17* 24 26 26 26   GLUCOSE  --    < > 233* 171* 162* 159* 162*  BUN  --    < > 34* 28* 25* 21* 19  CREATININE  --    < > 1.51*  1.27* 1.11 0.97 0.95  CALCIUM  --    < > 9.6 9.7 9.7 9.5 9.5  MG 1.2*  --   --   --   --   --   --    < > = values in this interval not displayed.   Liver Function Tests: Recent Labs  Lab 12/18/21 0345  AST 12*  ALT 12  ALKPHOS 51  BILITOT 3.3*  PROT 8.2*  ALBUMIN 4.5   CBG: Recent Labs  Lab 12/20/21 1654 12/20/21 1952 12/20/21 2159 12/21/21 0736 12/21/21 1201  GLUCAP 234* 243* 227* 198* 234*    Discharge time spent: greater than 30 minutes.  This record has been created using Systems analyst. Errors have been sought and corrected,but may not always be located. Such creation errors do not reflect on the standard of care.   Signed: Lorella Nimrod, MD [facility] 12/21/2021

## 2021-12-21 NOTE — Progress Notes (Signed)
Patient taken to medical mall via wheelchair to be taken home by friend in personal vehicle.

## 2021-12-23 LAB — CULTURE, BLOOD (ROUTINE X 2)
Culture: NO GROWTH
Culture: NO GROWTH
Special Requests: ADEQUATE
Special Requests: ADEQUATE

## 2022-05-24 ENCOUNTER — Other Ambulatory Visit: Payer: Self-pay | Admitting: Internal Medicine

## 2022-05-24 ENCOUNTER — Other Ambulatory Visit: Payer: BC Managed Care – PPO

## 2022-05-25 LAB — FRUCTOSAMINE: Fructosamine: 385 umol/L — ABNORMAL HIGH (ref 0–285)

## 2022-05-26 ENCOUNTER — Encounter: Payer: Self-pay | Admitting: Internal Medicine

## 2022-05-26 ENCOUNTER — Ambulatory Visit: Payer: BC Managed Care – PPO | Admitting: Internal Medicine

## 2022-05-26 VITALS — BP 140/80 | HR 76 | Ht 69.0 in | Wt 231.6 lb

## 2022-05-26 DIAGNOSIS — Z794 Long term (current) use of insulin: Secondary | ICD-10-CM

## 2022-05-26 DIAGNOSIS — E119 Type 2 diabetes mellitus without complications: Secondary | ICD-10-CM

## 2022-05-26 DIAGNOSIS — E1142 Type 2 diabetes mellitus with diabetic polyneuropathy: Secondary | ICD-10-CM | POA: Diagnosis not present

## 2022-05-26 DIAGNOSIS — E782 Mixed hyperlipidemia: Secondary | ICD-10-CM | POA: Diagnosis not present

## 2022-05-26 DIAGNOSIS — I10 Essential (primary) hypertension: Secondary | ICD-10-CM | POA: Diagnosis not present

## 2022-05-26 LAB — GLUCOSE, POCT (MANUAL RESULT ENTRY): POC Glucose: 231 mg/dl — AB (ref 70–99)

## 2022-05-26 MED ORDER — RYBELSUS 14 MG PO TABS: 14.0000 mg | ORAL_TABLET | Freq: Every day | ORAL | 3 refills | Status: AC

## 2022-05-26 NOTE — Progress Notes (Signed)
Established Patient Office Visit  Subjective:  Patient ID: Randy Deleon, male    DOB: 09-Oct-1965  Age: 57 y.o. MRN: [id number]  Chief Complaint  Patient presents with   Follow-up    6 week follow up    No new complaints, here for lab review and medication refills. Diabetes still uncontrolled per elevated Fructosamine of 385 and reports high home bg readings on his [employer].      Past Medical History:  Diagnosis Date   Diabetes mellitus without complication ([place])    Hypertension     Past Surgical History:  Procedure Laterality Date   COLONOSCOPY WITH PROPOFOL N/A 04/16/2021   Procedure: COLONOSCOPY WITH PROPOFOL;  Surgeon: Lin Landsman, MD;  Location: [facility] ENDOSCOPY;  Service: Gastroenterology;  Laterality: N/A;   none     TONSILLECTOMY      Social History   Socioeconomic History   Marital status: Single    Spouse name: Not on file   Number of children: Not on file   Years of education: Not on file   Highest education level: Not on file  Occupational History   Not on file  Tobacco Use   Smoking status: Every Day    Packs/day: 0.50    Years: 20.00    Total pack years: 10.00    Types: Cigarettes    Start date: 01/29/1997   Smokeless tobacco: Never   Tobacco comments:    Refer to [facility]  Vaping Use   Vaping Use: Never used  Substance and Sexual Activity   Alcohol use: Not Currently    Comment: occ   Drug use: Not Currently   Sexual activity: Not on file  Other Topics Concern   Not on file  Social History Narrative   Not on file   Social Determinants of Health   Financial Resource Strain: Not on file  Food Insecurity: No Food Insecurity (12/18/2021)   Hunger Vital Sign    Worried About Running Out of Food in the Last Year: Never true    Ran Out of Food in the Last Year: Never true  Transportation Needs: No Transportation Needs (12/18/2021)   PRAPARE - Hydrologist (Medical): No    Lack of  Transportation (Non-Medical): No  Physical Activity: Not on file  Stress: Not on file  Social Connections: Not on file  Intimate Partner Violence: Not At Risk (12/18/2021)   Humiliation, Afraid, Rape, and Kick questionnaire    Fear of Current or Ex-Partner: No    Emotionally Abused: No    Physically Abused: No    Sexually Abused: No    Family History  Problem Relation Age of Onset   Diabetes Mother    Hypertension Mother    Congestive Heart Failure Father    Diabetes Father    Hypertension Father     Allergies  Allergen Reactions   Contrast Media [Iodinated Contrast Media]     Renal failure   Other Rash and Other (See Comments)    Tested allergic to LIMA BEANS   Shellfish Allergy Rash and Other (See Comments)    TESTED ALLERGIC TO "SHELLFISH"    Review of Systems  Constitutional: Negative.   HENT: Negative.    Eyes: Negative.   Respiratory: Negative.    Cardiovascular: Negative.   Gastrointestinal: Negative.   Genitourinary: Negative.   Skin: Negative.   Neurological:  Positive for sensory change.       Numbness and tingling in hands and  feet  Endo/Heme/Allergies: Negative.        Objective:   BP (!) 140/80   Pulse 76   Ht '5\' 9"'$  (1.753 m)   Wt 231 lb 9.6 oz (105.1 kg)   SpO2 95%   BMI 34.20 kg/m   Vitals:   05/26/22 1027  BP: (!) 140/80  Pulse: 76  Height: '5\' 9"'$  (1.753 m)  Weight: 231 lb 9.6 oz (105.1 kg)  SpO2: 95%  BMI (Calculated): 34.19    Physical Exam Vitals reviewed.  Constitutional:      Appearance: Normal appearance. He is obese.  HENT:     Head: Normocephalic.     Left Ear: There is no impacted cerumen.     Nose: Nose normal.     Mouth/Throat:     Mouth: Mucous membranes are moist.     Pharynx: No posterior oropharyngeal erythema.  Eyes:     Extraocular Movements: Extraocular movements intact.     Pupils: Pupils are equal, round, and reactive to light.  Cardiovascular:     Rate and Rhythm: Regular rhythm.     Chest Wall: PMI  is not displaced.     Pulses: Normal pulses.     Heart sounds: Normal heart sounds. No murmur heard. Pulmonary:     Effort: Pulmonary effort is normal.     Breath sounds: Normal air entry. No rhonchi or rales.  Abdominal:     General: Abdomen is flat. Bowel sounds are normal. There is no distension.     Palpations: Abdomen is soft. There is no hepatomegaly, splenomegaly or mass.     Tenderness: There is no abdominal tenderness.  Musculoskeletal:        General: Normal range of motion.     Cervical back: Normal range of motion and neck supple.     Right lower leg: No edema.     Left lower leg: No edema.  Skin:    General: Skin is warm and dry.  Neurological:     General: No focal deficit present.     Mental Status: He is alert and oriented to person, place, and time.     Cranial Nerves: No cranial nerve deficit.     Motor: No weakness.  Psychiatric:        Mood and Affect: Mood normal.        Behavior: Behavior normal.      Results for orders placed or performed in visit on 05/26/22  POCT Glucose (CBG)  Result Value Ref Range   POC Glucose 231 (A) 70 - 99 mg/dl    Recent Results (from the past 2160 hour(Linh Hedberg))  Fructosamine     Status: Abnormal   Collection Time: 05/24/22  9:05 AM  Result Value Ref Range   Fructosamine 385 (H) 0 - 285 umol/L    Comment: Published reference interval for apparently healthy subjects between age 31 and 60 is 55 - 285 umol/L and in a poorly controlled diabetic population is 228 - 563 umol/L with a mean of 396 umol/L.   POCT Glucose (CBG)     Status: Abnormal   Collection Time: 05/26/22 10:35 AM  Result Value Ref Range   POC Glucose 231 (A) 70 - 99 mg/dl      Assessment & Plan:   Problem List Items Addressed This Visit   None Visit Diagnoses     Type 2 diabetes mellitus without complication, with long-term current use of insulin ([place])    -  Primary   Relevant Orders  POCT Glucose (CBG) (Completed)      1. Type 2 diabetes  mellitus without complication, with long-term current use of insulin ([facility]) - POCT Glucose (CBG) - Hemoglobin A1c; Future - Amb Referral to Nutrition and Diabetic Education - Semaglutide (RYBELSUS) 14 MG TABS; Take 1 tablet (14 mg total) by mouth daily.  Dispense: 30 tablet; Refill: 3  2. Primary hypertension - Comprehensive metabolic panel; Future  3. Diabetic polyneuropathy associated with type 2 diabetes mellitus ([place])  4. Mixed hyperlipidemia - Lipid panel; Future - CK; Future   No follow-ups on file.   Total time spent: 20 minutes  Volanda Napoleon, MD  05/26/2022

## 2022-06-27 ENCOUNTER — Other Ambulatory Visit: Payer: Self-pay | Admitting: Internal Medicine

## 2022-07-07 ENCOUNTER — Ambulatory Visit: Payer: BC Managed Care – PPO | Admitting: Internal Medicine

## 2022-07-14 ENCOUNTER — Ambulatory Visit: Payer: BC Managed Care – PPO | Admitting: Skilled Nursing Facility1

## 2022-07-19 ENCOUNTER — Ambulatory Visit: Payer: BC Managed Care – PPO | Admitting: Internal Medicine

## 2022-08-16 ENCOUNTER — Other Ambulatory Visit: Payer: BC Managed Care – PPO

## 2022-08-16 ENCOUNTER — Other Ambulatory Visit: Payer: Self-pay

## 2022-08-16 DIAGNOSIS — E782 Mixed hyperlipidemia: Secondary | ICD-10-CM

## 2022-08-16 DIAGNOSIS — E119 Type 2 diabetes mellitus without complications: Secondary | ICD-10-CM

## 2022-08-16 DIAGNOSIS — I10 Essential (primary) hypertension: Secondary | ICD-10-CM

## 2022-08-17 LAB — HEMOGLOBIN A1C
Est. average glucose Bld gHb Est-mCnc: 235 mg/dL
Hgb A1c MFr Bld: 9.8 % — ABNORMAL HIGH (ref 4.8–5.6)

## 2022-08-17 LAB — COMPREHENSIVE METABOLIC PANEL
ALT: 11 IU/L (ref 0–44)
AST: 10 IU/L (ref 0–40)
Albumin/Globulin Ratio: 2 (ref 1.2–2.2)
Albumin: 4.6 g/dL (ref 3.8–4.9)
Alkaline Phosphatase: 68 IU/L (ref 44–121)
BUN/Creatinine Ratio: 19 (ref 9–20)
BUN: 15 mg/dL (ref 6–24)
Bilirubin Total: 0.5 mg/dL (ref 0.0–1.2)
CO2: 24 mmol/L (ref 20–29)
Calcium: 9.8 mg/dL (ref 8.7–10.2)
Chloride: 94 mmol/L — ABNORMAL LOW (ref 96–106)
Creatinine, Ser: 0.77 mg/dL (ref 0.76–1.27)
Globulin, Total: 2.3 g/dL (ref 1.5–4.5)
Glucose: 389 mg/dL — ABNORMAL HIGH (ref 70–99)
Potassium: 4.6 mmol/L (ref 3.5–5.2)
Sodium: 134 mmol/L (ref 134–144)
Total Protein: 6.9 g/dL (ref 6.0–8.5)
eGFR: 106 mL/min/{1.73_m2} (ref >59)

## 2022-08-17 LAB — CK: Total CK: 69 U/L (ref 41–331)

## 2022-08-17 LAB — LIPID PANEL
Chol/HDL Ratio: 7.1 ratio — ABNORMAL HIGH (ref 0.0–5.0)
Cholesterol, Total: 257 mg/dL — ABNORMAL HIGH (ref 100–199)
HDL: 36 mg/dL — ABNORMAL LOW (ref >39)
LDL Chol Calc (NIH): 100 mg/dL — ABNORMAL HIGH (ref 0–99)
Triglycerides: 707 mg/dL (ref 0–149)
VLDL Cholesterol Cal: 121 mg/dL — ABNORMAL HIGH (ref 5–40)

## 2022-08-18 ENCOUNTER — Ambulatory Visit: Payer: BC Managed Care – PPO | Admitting: Internal Medicine

## 2022-08-18 ENCOUNTER — Encounter: Payer: Self-pay | Admitting: Internal Medicine

## 2022-08-18 VITALS — BP 126/82 | HR 84 | Ht 69.0 in | Wt 219.0 lb

## 2022-08-18 DIAGNOSIS — I10 Essential (primary) hypertension: Secondary | ICD-10-CM

## 2022-08-18 DIAGNOSIS — E782 Mixed hyperlipidemia: Secondary | ICD-10-CM

## 2022-08-18 DIAGNOSIS — E1142 Type 2 diabetes mellitus with diabetic polyneuropathy: Secondary | ICD-10-CM

## 2022-08-18 DIAGNOSIS — E119 Type 2 diabetes mellitus without complications: Secondary | ICD-10-CM

## 2022-08-18 DIAGNOSIS — Z794 Long term (current) use of insulin: Secondary | ICD-10-CM | POA: Diagnosis not present

## 2022-08-18 LAB — POC CREATINE & ALBUMIN,URINE
Creatinine, POC: 100 mg/dL
Microalbumin Ur, POC: 150 mg/L

## 2022-08-18 LAB — POCT CBG (FASTING - GLUCOSE)-MANUAL ENTRY: Glucose Fasting, POC: 273 mg/dL — AB (ref 70–99)

## 2022-08-18 MED ORDER — PIOGLITAZONE HCL 45 MG PO TABS: 45.0000 mg | ORAL_TABLET | Freq: Every morning | ORAL | 1 refills | Status: DC

## 2022-08-18 MED ORDER — CARVEDILOL 25 MG PO TABS: 25.0000 mg | ORAL_TABLET | Freq: Two times a day (BID) | ORAL | 1 refills | Status: DC

## 2022-08-18 MED ORDER — FENOFIBRATE 160 MG PO TABS: 160.0000 mg | ORAL_TABLET | Freq: Every morning | ORAL | 0 refills | Status: DC

## 2022-08-18 MED ORDER — ROSUVASTATIN CALCIUM 40 MG PO TABS: 40.0000 mg | ORAL_TABLET | Freq: Every evening | ORAL | 0 refills | Status: DC

## 2022-08-18 MED ORDER — METFORMIN HCL 1000 MG PO TABS: 1000.0000 mg | ORAL_TABLET | Freq: Two times a day (BID) | ORAL | 0 refills | Status: DC

## 2022-08-18 MED ORDER — GABAPENTIN 300 MG PO CAPS: 300.0000 mg | ORAL_CAPSULE | Freq: Three times a day (TID) | ORAL | 0 refills | Status: DC

## 2022-08-18 NOTE — Progress Notes (Signed)
Established Patient Office Visit  Subjective:  Patient ID: Randy Deleon, male    DOB: 1965/07/18  Age: 57 y.o. MRN: [id number]  Chief Complaint  Patient presents with   Follow-up    6 week follow up    No new complaints, here for lab review and medication refills. Labs reviewed and notable for uncontrolled diabetes, A1c improved to 9.8 but not at target, lipids not at target with very high trigs. Denies any hypoglycemic episodes and home bg readings have not been at target. Admits to mixed compliance with insulin and unsure if he'Randy Deleon taking [place].    No other concerns at this time.   Past Medical History:  Diagnosis Date   Diabetes mellitus without complication (HCC)    Hypertension     Past Surgical History:  Procedure Laterality Date   COLONOSCOPY WITH PROPOFOL N/A 04/16/2021   Procedure: COLONOSCOPY WITH PROPOFOL;  Surgeon: Toney Reil, MD;  Location: [facility] ENDOSCOPY;  Service: Gastroenterology;  Laterality: N/A;   none     TONSILLECTOMY      Social History   Socioeconomic History   Marital status: Single    Spouse name: Not on file   Number of children: Not on file   Years of education: Not on file   Highest education level: Not on file  Occupational History   Not on file  Tobacco Use   Smoking status: Every Day    Packs/day: 0.50    Years: 20.00    Additional pack years: 0.00    Total pack years: 10.00    Types: Cigarettes    Start date: 01/29/1997   Smokeless tobacco: Never   Tobacco comments:    Refer to [facility]  Vaping Use   Vaping Use: Never used  Substance and Sexual Activity   Alcohol use: Not Currently    Comment: occ   Drug use: Not Currently   Sexual activity: Not on file  Other Topics Concern   Not on file  Social History Narrative   Not on file   Social Determinants of Health   Financial Resource Strain: Not on file  Food Insecurity: No Food Insecurity (12/18/2021)   Hunger Vital Sign    Worried About Running Out of  Food in the Last Year: Never true    Ran Out of Food in the Last Year: Never true  Transportation Needs: No Transportation Needs (12/18/2021)   PRAPARE - Administrator, Civil Service (Medical): No    Lack of Transportation (Non-Medical): No  Physical Activity: Not on file  Stress: Not on file  Social Connections: Not on file  Intimate Partner Violence: Not At Risk (12/18/2021)   Humiliation, Afraid, Rape, and Kick questionnaire    Fear of Current or Ex-Partner: No    Emotionally Abused: No    Physically Abused: No    Sexually Abused: No    Family History  Problem Relation Age of Onset   Diabetes Mother    Hypertension Mother    Congestive Heart Failure Father    Diabetes Father    Hypertension Father     Allergies  Allergen Reactions   Contrast Media [Iodinated Contrast Media]     Renal failure   Other Rash and Other (See Comments)    Tested allergic to LIMA BEANS   Shellfish Allergy Rash and Other (See Comments)    TESTED ALLERGIC TO "SHELLFISH"    Review of Systems  Constitutional: Negative.   HENT: Negative.  Eyes: Negative.   Respiratory: Negative.    Cardiovascular: Negative.   Gastrointestinal: Negative.   Genitourinary: Negative.   Skin: Negative.   Neurological:  Positive for sensory change.       Numbness and tingling in hands and feet  Endo/Heme/Allergies: Negative.        Objective:   BP 126/82   Pulse 84   Ht 5\' 9"  (1.753 m)   Wt 219 lb (99.3 kg)   SpO2 97%   BMI 32.34 kg/m   Vitals:   08/18/22 1026  BP: 126/82  Pulse: 84  Height: 5\' 9"  (1.753 m)  Weight: 219 lb (99.3 kg)  SpO2: 97%  BMI (Calculated): 32.33    Physical Exam Vitals reviewed.  Constitutional:      Appearance: Normal appearance. He is obese.  HENT:     Head: Normocephalic.     Left Ear: There is no impacted cerumen.     Nose: Nose normal.     Mouth/Throat:     Mouth: Mucous membranes are moist.     Pharynx: No posterior oropharyngeal erythema.   Eyes:     Extraocular Movements: Extraocular movements intact.     Pupils: Pupils are equal, round, and reactive to light.  Cardiovascular:     Rate and Rhythm: Regular rhythm.     Chest Wall: PMI is not displaced.     Pulses: Normal pulses.     Heart sounds: Normal heart sounds. No murmur heard. Pulmonary:     Effort: Pulmonary effort is normal.     Breath sounds: Normal air entry. No rhonchi or rales.  Abdominal:     General: Abdomen is flat. Bowel sounds are normal. There is no distension.     Palpations: Abdomen is soft. There is no hepatomegaly, splenomegaly or mass.     Tenderness: There is no abdominal tenderness.  Musculoskeletal:        General: Normal range of motion.     Cervical back: Normal range of motion and neck supple.     Right lower leg: No edema.     Left lower leg: No edema.  Skin:    General: Skin is warm and dry.  Neurological:     General: No focal deficit present.     Mental Status: He is alert and oriented to person, place, and time.     Cranial Nerves: No cranial nerve deficit.     Motor: No weakness.  Psychiatric:        Mood and Affect: Mood normal.        Behavior: Behavior normal.      Results for orders placed or performed in visit on 08/18/22  POCT CBG (Fasting - Glucose)  Result Value Ref Range   Glucose Fasting, POC 273 (A) 70 - 99 mg/dL  POC CREATINE & ALBUMIN,URINE  Result Value Ref Range   Microalbumin Ur, POC 150 mg/L   Creatinine, POC 100 mg/dL   Albumin/Creatinine Ratio, Urine, POC 30-300     Recent Results (from the past 2160 hour(Randy Deleon))  Fructosamine     Status: Abnormal   Collection Time: 05/24/22  9:05 AM  Result Value Ref Range   Fructosamine 385 (H) 0 - 285 umol/L    Comment: Published reference interval for apparently healthy subjects between age 44 and 84 is 75 - 285 umol/L and in a poorly controlled diabetic population is 228 - 563 umol/L with a mean of 396 umol/L.   POCT Glucose (CBG)     Status: Abnormal  Collection Time: 05/26/22 10:35 AM  Result Value Ref Range   POC Glucose 231 (A) 70 - 99 mg/dl  CK     Status: None   Collection Time: 08/16/22 10:07 AM  Result Value Ref Range   Total CK 69 41 - 331 U/L  Lipid panel     Status: Abnormal   Collection Time: 08/16/22 10:07 AM  Result Value Ref Range   Cholesterol, Total 257 (H) 100 - 199 mg/dL   Triglycerides 161 (HH) 0 - 149 mg/dL   HDL 36 (L) >09 mg/dL   VLDL Cholesterol Cal 121 (H) 5 - 40 mg/dL   LDL Chol Calc (NIH) 604 (H) 0 - 99 mg/dL   Chol/HDL Ratio 7.1 (H) 0.0 - 5.0 ratio    Comment:                                   T. Chol/HDL Ratio                                             Men  Women                               1/2 Avg.Risk  3.4    3.3                                   Avg.Risk  5.0    4.4                                2X Avg.Risk  9.6    7.1                                3X Avg.Risk 23.4   11.0   Hemoglobin A1c     Status: Abnormal   Collection Time: 08/16/22 10:07 AM  Result Value Ref Range   Hgb A1c MFr Bld 9.8 (H) 4.8 - 5.6 %    Comment:          Prediabetes: 5.7 - 6.4          Diabetes: >6.4          Glycemic control for adults with diabetes: <7.0    Est. average glucose Bld gHb Est-mCnc 235 mg/dL  Comprehensive metabolic panel     Status: Abnormal   Collection Time: 08/16/22 10:07 AM  Result Value Ref Range   Glucose 389 (H) 70 - 99 mg/dL   BUN 15 6 - 24 mg/dL   Creatinine, Ser 5.40 0.76 - 1.27 mg/dL   eGFR 981 >19 JY/NWG/9.56   BUN/Creatinine Ratio 19 9 - 20   Sodium 134 134 - 144 mmol/L   Potassium 4.6 3.5 - 5.2 mmol/L   Chloride 94 (L) 96 - 106 mmol/L   CO2 24 20 - 29 mmol/L   Calcium 9.8 8.7 - 10.2 mg/dL   Total Protein 6.9 6.0 - 8.5 g/dL   Albumin 4.6 3.8 - 4.9 g/dL   Globulin, Total 2.3 1.5 - 4.5 g/dL   Albumin/Globulin Ratio 2.0 1.2 - 2.2  Bilirubin Total 0.5 0.0 - 1.2 mg/dL   Alkaline Phosphatase 68 44 - 121 IU/L   AST 10 0 - 40 IU/L   ALT 11 0 - 44 IU/L  POCT CBG (Fasting - Glucose)      Status: Abnormal   Collection Time: 08/18/22 10:34 AM  Result Value Ref Range   Glucose Fasting, POC 273 (A) 70 - 99 mg/dL  POC CREATINE & ALBUMIN,URINE     Status: Abnormal   Collection Time: 08/18/22 11:10 AM  Result Value Ref Range   Microalbumin Ur, POC 150 mg/L   Creatinine, POC 100 mg/dL   Albumin/Creatinine Ratio, Urine, POC 30-300       Assessment & Plan:   Problem List Items Addressed This Visit       Cardiovascular and Mediastinum   HTN (hypertension)   Relevant Medications   fenofibrate 160 MG tablet   rosuvastatin (CRESTOR) 40 MG tablet   carvedilol (COREG) 25 MG tablet     Other   HLD (hyperlipidemia)   Relevant Medications   fenofibrate 160 MG tablet   metFORMIN (GLUCOPHAGE) 1000 MG tablet   rosuvastatin (CRESTOR) 40 MG tablet   carvedilol (COREG) 25 MG tablet   Other Visit Diagnoses     Type 2 diabetes mellitus without complication, with long-term current use of insulin (HCC)    -  Primary   Relevant Medications   metFORMIN (GLUCOPHAGE) 1000 MG tablet   rosuvastatin (CRESTOR) 40 MG tablet   pioglitazone (ACTOS) 45 MG tablet   Other Relevant Orders   POCT CBG (Fasting - Glucose) (Completed)   POC CREATINE & ALBUMIN,URINE (Completed)   Fructosamine   Diabetic polyneuropathy associated with type 2 diabetes mellitus (HCC)       Relevant Medications   gabapentin (NEURONTIN) 300 MG capsule   metFORMIN (GLUCOPHAGE) 1000 MG tablet   rosuvastatin (CRESTOR) 40 MG tablet   pioglitazone (ACTOS) 45 MG tablet       Return in about 6 weeks (around 09/29/2022) for fu with labs prior.   Total time spent: 30 minutes  Luna Fuse, MD  08/18/2022   This document may have been prepared by [facility] Voice Recognition software and as such may include unintentional dictation errors.

## 2022-09-29 ENCOUNTER — Other Ambulatory Visit: Payer: BC Managed Care – PPO

## 2022-09-29 DIAGNOSIS — E119 Type 2 diabetes mellitus without complications: Secondary | ICD-10-CM

## 2022-09-30 LAB — FRUCTOSAMINE: Fructosamine: 393 umol/L — ABNORMAL HIGH (ref 0–285)

## 2022-10-03 ENCOUNTER — Ambulatory Visit: Payer: BC Managed Care – PPO | Admitting: Internal Medicine

## 2022-10-03 ENCOUNTER — Encounter: Payer: Self-pay | Admitting: Internal Medicine

## 2022-10-03 VITALS — BP 134/78 | HR 74 | Ht 69.0 in | Wt 220.0 lb

## 2022-10-03 DIAGNOSIS — I10 Essential (primary) hypertension: Secondary | ICD-10-CM | POA: Diagnosis not present

## 2022-10-03 DIAGNOSIS — E782 Mixed hyperlipidemia: Secondary | ICD-10-CM

## 2022-10-03 DIAGNOSIS — Z794 Long term (current) use of insulin: Secondary | ICD-10-CM

## 2022-10-03 DIAGNOSIS — E119 Type 2 diabetes mellitus without complications: Secondary | ICD-10-CM | POA: Diagnosis not present

## 2022-10-03 LAB — POCT CBG (FASTING - GLUCOSE)-MANUAL ENTRY: Glucose Fasting, POC: 260 mg/dL — AB (ref 70–99)

## 2022-10-03 MED ORDER — AMLODIPINE BESY-BENAZEPRIL HCL 10-20 MG PO CAPS: 1.0000 | ORAL_CAPSULE | Freq: Every morning | ORAL | 0 refills | Status: DC

## 2022-10-03 MED ORDER — RYBELSUS 14 MG PO TABS: 14.0000 mg | ORAL_TABLET | Freq: Every day | ORAL | 0 refills | Status: DC

## 2022-10-03 NOTE — Progress Notes (Signed)
Established Patient Office Visit  Subjective:  Patient ID: Randy Deleon, male    DOB: Nov 12, 1965  Age: 57 y.o. MRN: [id number]  Chief Complaint  Patient presents with   Follow-up    6 week lab result    No new complaints, here for lab review and medication refills. Labs reviewed and notable for uncontrolled diabetes, fructosamine not at target. Denies any hypoglycemic episodes and home bg readings have not been at target. Recovering from a URI for which he was treated at [facility]. He was only taking 7 mg of Rybelsus instead of 14.    No other concerns at this time.   Past Medical History:  Diagnosis Date   Diabetes mellitus without complication (HCC)    Hypertension     Past Surgical History:  Procedure Laterality Date   COLONOSCOPY WITH PROPOFOL N/A 04/16/2021   Procedure: COLONOSCOPY WITH PROPOFOL;  Surgeon: Toney Reil, MD;  Location: [facility] ENDOSCOPY;  Service: Gastroenterology;  Laterality: N/A;   none     TONSILLECTOMY      Social History   Socioeconomic History   Marital status: Single    Spouse name: Not on file   Number of children: Not on file   Years of education: Not on file   Highest education level: Not on file  Occupational History   Not on file  Tobacco Use   Smoking status: Every Day    Current packs/day: 0.50    Average packs/day: 0.5 packs/day for 25.7 years (12.8 ttl pk-yrs)    Types: Cigarettes    Start date: 01/29/1997   Smokeless tobacco: Never   Tobacco comments:    Refer to [facility]  Vaping Use   Vaping status: Never Used  Substance and Sexual Activity   Alcohol use: Not Currently    Comment: occ   Drug use: Not Currently   Sexual activity: Not on file  Other Topics Concern   Not on file  Social History Narrative   Not on file   Social Determinants of Health   Financial Resource Strain: Not on file  Food Insecurity: No Food Insecurity (12/18/2021)   Hunger Vital Sign    Worried About Running Out of Food in the  Last Year: Never true    Ran Out of Food in the Last Year: Never true  Transportation Needs: No Transportation Needs (12/18/2021)   PRAPARE - Administrator, Civil Service (Medical): No    Lack of Transportation (Non-Medical): No  Physical Activity: Not on file  Stress: Not on file  Social Connections: Not on file  Intimate Partner Violence: Not At Risk (12/18/2021)   Humiliation, Afraid, Rape, and Kick questionnaire    Fear of Current or Ex-Partner: No    Emotionally Abused: No    Physically Abused: No    Sexually Abused: No    Family History  Problem Relation Age of Onset   Diabetes Mother    Hypertension Mother    Congestive Heart Failure Father    Diabetes Father    Hypertension Father     Allergies  Allergen Reactions   Contrast Media [Iodinated Contrast Media]     Renal failure   Other Rash and Other (See Comments)    Tested allergic to LIMA BEANS   Shellfish Allergy Rash and Other (See Comments)    TESTED ALLERGIC TO "SHELLFISH"    Review of Systems  Constitutional:  Negative for weight loss.  HENT: Negative.    Eyes: Negative.  Respiratory:  Positive for cough and sputum production. Negative for shortness of breath and wheezing.   Cardiovascular: Negative.   Gastrointestinal: Negative.   Genitourinary: Negative.   Skin: Negative.   Neurological:  Positive for sensory change.       Numbness and tingling in hands and feet  Endo/Heme/Allergies: Negative.        Objective:   BP 134/78   Pulse 74   Ht 5\' 9"  (1.753 m)   Wt 220 lb (99.8 kg)   SpO2 99%   BMI 32.49 kg/m   Vitals:   10/03/22 0940 10/03/22 1013  BP: (!) 140/80 134/78  Pulse: 74   Height: 5\' 9"  (1.753 m)   Weight: 220 lb (99.8 kg)   SpO2: 99%   BMI (Calculated): 32.47     Physical Exam Vitals reviewed.  Constitutional:      Appearance: Normal appearance. He is obese.  HENT:     Head: Normocephalic.     Left Ear: There is no impacted cerumen.     Nose: Nose normal.      Mouth/Throat:     Mouth: Mucous membranes are moist.     Pharynx: No posterior oropharyngeal erythema.  Eyes:     Extraocular Movements: Extraocular movements intact.     Pupils: Pupils are equal, round, and reactive to light.  Cardiovascular:     Rate and Rhythm: Regular rhythm.     Chest Wall: PMI is not displaced.     Pulses: Normal pulses.     Heart sounds: Normal heart sounds. No murmur heard. Pulmonary:     Effort: Pulmonary effort is normal.     Breath sounds: Normal air entry. No rhonchi or rales.  Abdominal:     General: Abdomen is flat. Bowel sounds are normal. There is no distension.     Palpations: Abdomen is soft. There is no hepatomegaly, splenomegaly or mass.     Tenderness: There is no abdominal tenderness.  Musculoskeletal:        General: Normal range of motion.     Cervical back: Normal range of motion and neck supple.     Right lower leg: No edema.     Left lower leg: No edema.  Skin:    General: Skin is warm and dry.  Neurological:     General: No focal deficit present.     Mental Status: He is alert and oriented to person, place, and time.     Cranial Nerves: No cranial nerve deficit.     Motor: No weakness.  Psychiatric:        Mood and Affect: Mood normal.        Behavior: Behavior normal.      Results for orders placed or performed in visit on 10/03/22  POCT CBG (Fasting - Glucose)  Result Value Ref Range   Glucose Fasting, POC 260 (A) 70 - 99 mg/dL    Recent Results (from the past 2160 hour(Kelyn Ponciano))  CK     Status: None   Collection Time: 08/16/22 10:07 AM  Result Value Ref Range   Total CK 69 41 - 331 U/L  Lipid panel     Status: Abnormal   Collection Time: 08/16/22 10:07 AM  Result Value Ref Range   Cholesterol, Total 257 (H) 100 - 199 mg/dL   Triglycerides 161 (HH) 0 - 149 mg/dL   HDL 36 (L) >09 mg/dL   VLDL Cholesterol Cal 121 (H) 5 - 40 mg/dL   LDL Chol Calc (NIH) 604 (  H) 0 - 99 mg/dL   Chol/HDL Ratio 7.1 (H) 0.0 - 5.0 ratio     Comment:                                   T. Chol/HDL Ratio                                             Men  Women                               1/2 Avg.Risk  3.4    3.3                                   Avg.Risk  5.0    4.4                                2X Avg.Risk  9.6    7.1                                3X Avg.Risk 23.4   11.0   Hemoglobin A1c     Status: Abnormal   Collection Time: 08/16/22 10:07 AM  Result Value Ref Range   Hgb A1c MFr Bld 9.8 (H) 4.8 - 5.6 %    Comment:          Prediabetes: 5.7 - 6.4          Diabetes: >6.4          Glycemic control for adults with diabetes: <7.0    Est. average glucose Bld gHb Est-mCnc 235 mg/dL  Comprehensive metabolic panel     Status: Abnormal   Collection Time: 08/16/22 10:07 AM  Result Value Ref Range   Glucose 389 (H) 70 - 99 mg/dL   BUN 15 6 - 24 mg/dL   Creatinine, Ser 1.61 0.76 - 1.27 mg/dL   eGFR 096 >04 VW/UJW/1.19   BUN/Creatinine Ratio 19 9 - 20   Sodium 134 134 - 144 mmol/L   Potassium 4.6 3.5 - 5.2 mmol/L   Chloride 94 (L) 96 - 106 mmol/L   CO2 24 20 - 29 mmol/L   Calcium 9.8 8.7 - 10.2 mg/dL   Total Protein 6.9 6.0 - 8.5 g/dL   Albumin 4.6 3.8 - 4.9 g/dL   Globulin, Total 2.3 1.5 - 4.5 g/dL   Albumin/Globulin Ratio 2.0 1.2 - 2.2   Bilirubin Total 0.5 0.0 - 1.2 mg/dL   Alkaline Phosphatase 68 44 - 121 IU/L   AST 10 0 - 40 IU/L   ALT 11 0 - 44 IU/L  POCT CBG (Fasting - Glucose)     Status: Abnormal   Collection Time: 08/18/22 10:34 AM  Result Value Ref Range   Glucose Fasting, POC 273 (A) 70 - 99 mg/dL  POC CREATINE & ALBUMIN,URINE     Status: Abnormal   Collection Time: 08/18/22 11:10 AM  Result Value Ref Range   Microalbumin Ur, POC 150 mg/L   Creatinine, POC 100 mg/dL   Albumin/Creatinine Ratio, Urine, POC 30-300   Fructosamine  Status: Abnormal   Collection Time: 09/29/22  9:17 AM  Result Value Ref Range   Fructosamine 393 (H) 0 - 285 umol/L    Comment: Published reference interval for apparently  healthy subjects between age 47 and 29 is 12 - 285 umol/L and in a poorly controlled diabetic population is 228 - 563 umol/L with a mean of 396 umol/L.   POCT CBG (Fasting - Glucose)     Status: Abnormal   Collection Time: 10/03/22  9:56 AM  Result Value Ref Range   Glucose Fasting, POC 260 (A) 70 - 99 mg/dL      Assessment & Plan:  As per problem list, increase Rybelsus  Problem List Items Addressed This Visit       Cardiovascular and Mediastinum   HTN (hypertension)   Relevant Medications   amLODipine-benazepril (LOTREL) 10-20 MG capsule     Other   HLD (hyperlipidemia)   Relevant Medications   amLODipine-benazepril (LOTREL) 10-20 MG capsule   Other Relevant Orders   Comprehensive metabolic panel   Lipid panel   CK   Other Visit Diagnoses     Type 2 diabetes mellitus without complication, with long-term current use of insulin (HCC)    -  Primary   Relevant Medications   amLODipine-benazepril (LOTREL) 10-20 MG capsule   Semaglutide (RYBELSUS) 14 MG TABS   Other Relevant Orders   POCT CBG (Fasting - Glucose) (Completed)   Hemoglobin A1c       Return in about 6 weeks (around 11/14/2022) for fu with labs prior.   Total time spent: 20 minutes  Luna Fuse, MD  10/03/2022   This document may have been prepared by [facility] Voice Recognition software and as such may include unintentional dictation errors.

## 2022-10-18 ENCOUNTER — Ambulatory Visit: Payer: BC Managed Care – PPO | Admitting: Dietician

## 2022-11-13 ENCOUNTER — Other Ambulatory Visit: Payer: BC Managed Care – PPO

## 2022-11-13 DIAGNOSIS — E119 Type 2 diabetes mellitus without complications: Secondary | ICD-10-CM

## 2022-11-13 DIAGNOSIS — E782 Mixed hyperlipidemia: Secondary | ICD-10-CM

## 2022-11-14 ENCOUNTER — Other Ambulatory Visit: Payer: Self-pay | Admitting: Internal Medicine

## 2022-11-14 DIAGNOSIS — E1142 Type 2 diabetes mellitus with diabetic polyneuropathy: Secondary | ICD-10-CM

## 2022-11-14 LAB — COMPREHENSIVE METABOLIC PANEL
ALT: 11 IU/L (ref 0–44)
AST: 10 IU/L (ref 0–40)
Albumin: 4.4 g/dL (ref 3.8–4.9)
Alkaline Phosphatase: 53 IU/L (ref 44–121)
BUN/Creatinine Ratio: 19 (ref 9–20)
BUN: 13 mg/dL (ref 6–24)
Bilirubin Total: 0.5 mg/dL (ref 0.0–1.2)
CO2: 24 mmol/L (ref 20–29)
Calcium: 9.5 mg/dL (ref 8.7–10.2)
Chloride: 97 mmol/L (ref 96–106)
Creatinine, Ser: 0.67 mg/dL — ABNORMAL LOW (ref 0.76–1.27)
Globulin, Total: 2.3 g/dL (ref 1.5–4.5)
Glucose: 277 mg/dL — ABNORMAL HIGH (ref 70–99)
Potassium: 4.4 mmol/L (ref 3.5–5.2)
Sodium: 138 mmol/L (ref 134–144)
Total Protein: 6.7 g/dL (ref 6.0–8.5)
eGFR: 110 mL/min/{1.73_m2} (ref >59)

## 2022-11-14 LAB — HEMOGLOBIN A1C
Est. average glucose Bld gHb Est-mCnc: 255 mg/dL
Hgb A1c MFr Bld: 10.5 % — ABNORMAL HIGH (ref 4.8–5.6)

## 2022-11-14 LAB — LIPID PANEL
Chol/HDL Ratio: 4.6 ratio (ref 0.0–5.0)
Cholesterol, Total: 214 mg/dL — ABNORMAL HIGH (ref 100–199)
HDL: 47 mg/dL (ref >39)
LDL Chol Calc (NIH): 136 mg/dL — ABNORMAL HIGH (ref 0–99)
Triglycerides: 174 mg/dL — ABNORMAL HIGH (ref 0–149)
VLDL Cholesterol Cal: 31 mg/dL (ref 5–40)

## 2022-11-14 LAB — CK: Total CK: 50 U/L (ref 41–331)

## 2022-11-15 ENCOUNTER — Ambulatory Visit: Payer: BC Managed Care – PPO | Admitting: Internal Medicine

## 2022-11-17 ENCOUNTER — Other Ambulatory Visit: Payer: Self-pay | Admitting: Internal Medicine

## 2022-11-23 ENCOUNTER — Other Ambulatory Visit: Payer: Self-pay

## 2022-11-23 ENCOUNTER — Telehealth: Payer: Self-pay | Admitting: Internal Medicine

## 2022-11-23 DIAGNOSIS — Z794 Long term (current) use of insulin: Secondary | ICD-10-CM

## 2022-11-23 DIAGNOSIS — E782 Mixed hyperlipidemia: Secondary | ICD-10-CM

## 2022-11-23 MED ORDER — METFORMIN HCL 1000 MG PO TABS: 1000.0000 mg | ORAL_TABLET | Freq: Two times a day (BID) | ORAL | 0 refills | Status: DC

## 2022-11-23 NOTE — Telephone Encounter (Signed)
Pt was originally scheduled on the 12/01/22, pt had to have appt moved back but needs a refill on the Metformin & Gabapentin prior to appt date, cause he will be out

## 2022-12-01 ENCOUNTER — Ambulatory Visit: Payer: BC Managed Care – PPO | Admitting: Internal Medicine

## 2022-12-06 ENCOUNTER — Encounter: Payer: Self-pay | Admitting: Internal Medicine

## 2022-12-06 ENCOUNTER — Ambulatory Visit (INDEPENDENT_AMBULATORY_CARE_PROVIDER_SITE_OTHER): Payer: BC Managed Care – PPO | Admitting: Internal Medicine

## 2022-12-06 VITALS — BP 118/71 | HR 80 | Ht 69.0 in | Wt 222.6 lb

## 2022-12-06 DIAGNOSIS — E782 Mixed hyperlipidemia: Secondary | ICD-10-CM

## 2022-12-06 DIAGNOSIS — Z794 Long term (current) use of insulin: Secondary | ICD-10-CM

## 2022-12-06 DIAGNOSIS — E119 Type 2 diabetes mellitus without complications: Secondary | ICD-10-CM

## 2022-12-06 DIAGNOSIS — N521 Erectile dysfunction due to diseases classified elsewhere: Secondary | ICD-10-CM | POA: Diagnosis not present

## 2022-12-06 DIAGNOSIS — E1142 Type 2 diabetes mellitus with diabetic polyneuropathy: Secondary | ICD-10-CM | POA: Insufficient documentation

## 2022-12-06 LAB — POCT CBG (FASTING - GLUCOSE)-MANUAL ENTRY: Glucose Fasting, POC: 312 mg/dL — AB (ref 70–99)

## 2022-12-06 MED ORDER — GABAPENTIN 400 MG PO CAPS
400.0000 mg | ORAL_CAPSULE | Freq: Three times a day (TID) | ORAL | 0 refills | Status: DC
Start: 2022-12-06 — End: 2023-07-25

## 2022-12-06 MED ORDER — FENOFIBRATE 160 MG PO TABS
160.0000 mg | ORAL_TABLET | Freq: Every morning | ORAL | 0 refills | Status: DC
Start: 2022-12-06 — End: 2023-07-25

## 2022-12-06 MED ORDER — ROSUVASTATIN CALCIUM 40 MG PO TABS
40.0000 mg | ORAL_TABLET | Freq: Every evening | ORAL | 0 refills | Status: DC
Start: 2022-12-06 — End: 2023-07-25

## 2022-12-06 MED ORDER — VARDENAFIL HCL 20 MG PO TABS: 20.0000 mg | ORAL_TABLET | Freq: Every day | ORAL | 0 refills | Status: DC | PRN

## 2022-12-06 MED ORDER — TOUJEO MAX SOLOSTAR 300 UNIT/ML ~~LOC~~ SOPN: 100.0000 [IU] | PEN_INJECTOR | Freq: Two times a day (BID) | SUBCUTANEOUS | 0 refills | Status: DC

## 2022-12-06 MED ORDER — RYBELSUS 14 MG PO TABS
14.0000 mg | ORAL_TABLET | Freq: Every day | ORAL | 0 refills | Status: AC
Start: 2022-12-06 — End: 2023-03-06

## 2022-12-06 MED ORDER — METFORMIN HCL 1000 MG PO TABS: 1000.0000 mg | ORAL_TABLET | Freq: Two times a day (BID) | ORAL | 0 refills | Status: DC

## 2022-12-06 NOTE — Progress Notes (Signed)
Established Patient Office Visit  Subjective:  Patient ID: Randy Deleon, male    DOB: 09/20/1965  Age: 57 y.o. MRN: [id number]  Chief Complaint  Patient presents with   Follow-up    No new complaints, here for lab review and medication refills. Labs reviewed and notable for well controlled diabetes, A1c at target. Denies any hypoglycemic episodes and home bg readings have been at target.  Lipids not at target although triglycerides have improved.    No other concerns at this time.     Past Surgical History:  Procedure Laterality Date   COLONOSCOPY WITH PROPOFOL N/A 04/16/2021   Procedure: COLONOSCOPY WITH PROPOFOL;  Surgeon: Toney Reil, MD;  Location: [facility] ENDOSCOPY;  Service: Gastroenterology;  Laterality: N/A;   none     TONSILLECTOMY      Social History   Socioeconomic History   Marital status: Single    Spouse name: Not on file   Number of children: Not on file   Years of education: Not on file   Highest education level: Not on file  Occupational History   Not on file  Tobacco Use   Smoking status: Every Day    Current packs/day: 0.50    Average packs/day: 0.5 packs/day for 25.8 years (12.9 ttl pk-yrs)    Types: Cigarettes    Start date: 01/29/1997   Smokeless tobacco: Never   Tobacco comments:    Refer to [facility]  Vaping Use   Vaping status: Never Used  Substance and Sexual Activity   Alcohol use: Not Currently    Comment: occ   Drug use: Not Currently   Sexual activity: Not on file  Other Topics Concern   Not on file  Social History Narrative   Not on file   Social Determinants of Health   Financial Resource Strain: Not on file  Food Insecurity: No Food Insecurity (12/18/2021)   Hunger Vital Sign    Worried About Running Out of Food in the Last Year: Never true    Ran Out of Food in the Last Year: Never true  Transportation Needs: No Transportation Needs (12/18/2021)   PRAPARE - Administrator, Civil Service  (Medical): No    Lack of Transportation (Non-Medical): No  Physical Activity: Not on file  Stress: Not on file  Social Connections: Not on file  Intimate Partner Violence: Not At Risk (12/18/2021)   Humiliation, Afraid, Rape, and Kick questionnaire    Fear of Current or Ex-Partner: No    Emotionally Abused: No    Physically Abused: No    Sexually Abused: No    Family History  Problem Relation Age of Onset   Diabetes Mother    Hypertension Mother    Congestive Heart Failure Father    Diabetes Father    Hypertension Father     Allergies  Allergen Reactions   Contrast Media [Iodinated Contrast Media]     Renal failure   Other Rash and Other (See Comments)    Tested allergic to LIMA BEANS   Shellfish Allergy Rash and Other (See Comments)    TESTED ALLERGIC TO "SHELLFISH"    Review of Systems  Constitutional: Negative.   HENT: Negative.    Eyes: Negative.   Respiratory: Negative.    Cardiovascular: Negative.   Gastrointestinal: Negative.   Genitourinary: Negative.        ED  Skin: Negative.   Neurological:  Positive for sensory change.       Numbness and tingling  in hands and feet  Endo/Heme/Allergies: Negative.        Objective:   BP 118/71   Pulse 80   Ht 5\' 9"  (1.753 m)   Wt 222 lb 9.6 oz (101 kg)   SpO2 96%   BMI 32.87 kg/m   Vitals:   12/06/22 1006  BP: 118/71  Pulse: 80  Height: 5\' 9"  (1.753 m)  Weight: 222 lb 9.6 oz (101 kg)  SpO2: 96%  BMI (Calculated): 32.86    Physical Exam Vitals reviewed.  Constitutional:      Appearance: Normal appearance. He is obese.  HENT:     Head: Normocephalic.     Left Ear: There is no impacted cerumen.     Nose: Nose normal.     Mouth/Throat:     Mouth: Mucous membranes are moist.     Pharynx: No posterior oropharyngeal erythema.  Eyes:     Extraocular Movements: Extraocular movements intact.     Pupils: Pupils are equal, round, and reactive to light.  Cardiovascular:     Rate and Rhythm: Regular  rhythm.     Chest Wall: PMI is not displaced.     Pulses: Normal pulses.     Heart sounds: Normal heart sounds. No murmur heard. Pulmonary:     Effort: Pulmonary effort is normal.     Breath sounds: Normal air entry. No rhonchi or rales.  Abdominal:     General: Abdomen is flat. Bowel sounds are normal. There is no distension.     Palpations: Abdomen is soft. There is no hepatomegaly, splenomegaly or mass.     Tenderness: There is no abdominal tenderness.  Musculoskeletal:        General: Normal range of motion.     Cervical back: Normal range of motion and neck supple.     Right lower leg: No edema.     Left lower leg: No edema.  Skin:    General: Skin is warm and dry.  Neurological:     General: No focal deficit present.     Mental Status: He is alert and oriented to person, place, and time.     Cranial Nerves: No cranial nerve deficit.     Motor: No weakness.  Psychiatric:        Mood and Affect: Mood normal.        Behavior: Behavior normal.      Results for orders placed or performed in visit on 12/06/22  POCT CBG (Fasting - Glucose)  Result Value Ref Range   Glucose Fasting, POC 312 (A) 70 - 99 mg/dL    Recent Results (from the past 2160 hour(Anyah Swallow))  Fructosamine     Status: Abnormal   Collection Time: 09/29/22  9:17 AM  Result Value Ref Range   Fructosamine 393 (H) 0 - 285 umol/L    Comment: Published reference interval for apparently healthy subjects between age 56 and 16 is 70 - 285 umol/L and in a poorly controlled diabetic population is 228 - 563 umol/L with a mean of 396 umol/L.   POCT CBG (Fasting - Glucose)     Status: Abnormal   Collection Time: 10/03/22  9:56 AM  Result Value Ref Range   Glucose Fasting, POC 260 (A) 70 - 99 mg/dL  CK     Status: None   Collection Time: 11/13/22 10:39 AM  Result Value Ref Range   Total CK 50 41 - 331 U/L  Lipid panel     Status: Abnormal   Collection  Time: 11/13/22 10:39 AM  Result Value Ref Range   Cholesterol,  Total 214 (H) 100 - 199 mg/dL   Triglycerides 403 (H) 0 - 149 mg/dL   HDL 47 >47 mg/dL   VLDL Cholesterol Cal 31 5 - 40 mg/dL   LDL Chol Calc (NIH) 425 (H) 0 - 99 mg/dL   Chol/HDL Ratio 4.6 0.0 - 5.0 ratio    Comment:                                   T. Chol/HDL Ratio                                             Men  Women                               1/2 Avg.Risk  3.4    3.3                                   Avg.Risk  5.0    4.4                                2X Avg.Risk  9.6    7.1                                3X Avg.Risk 23.4   11.0   Comprehensive metabolic panel     Status: Abnormal   Collection Time: 11/13/22 10:39 AM  Result Value Ref Range   Glucose 277 (H) 70 - 99 mg/dL   BUN 13 6 - 24 mg/dL   Creatinine, Ser 9.56 (L) 0.76 - 1.27 mg/dL   eGFR 387 >56 EP/PIR/5.18   BUN/Creatinine Ratio 19 9 - 20   Sodium 138 134 - 144 mmol/L   Potassium 4.4 3.5 - 5.2 mmol/L   Chloride 97 96 - 106 mmol/L   CO2 24 20 - 29 mmol/L   Calcium 9.5 8.7 - 10.2 mg/dL   Total Protein 6.7 6.0 - 8.5 g/dL   Albumin 4.4 3.8 - 4.9 g/dL   Globulin, Total 2.3 1.5 - 4.5 g/dL   Bilirubin Total 0.5 0.0 - 1.2 mg/dL   Alkaline Phosphatase 53 44 - 121 IU/L   AST 10 0 - 40 IU/L   ALT 11 0 - 44 IU/L  Hemoglobin A1c     Status: Abnormal   Collection Time: 11/13/22 10:39 AM  Result Value Ref Range   Hgb A1c MFr Bld 10.5 (H) 4.8 - 5.6 %    Comment:          Prediabetes: 5.7 - 6.4          Diabetes: >6.4          Glycemic control for adults with diabetes: <7.0    Est. average glucose Bld gHb Est-mCnc 255 mg/dL  POCT CBG (Fasting - Glucose)     Status: Abnormal   Collection Time: 12/06/22 10:10 AM  Result Value Ref Range   Glucose Fasting, POC 312 (A) 70 - 99 mg/dL  Assessment & Plan:  As per problem list  Problem List Items Addressed This Visit       Endocrine   Diabetic polyneuropathy associated with type 2 diabetes mellitus (HCC) - Primary   Relevant Medications   metFORMIN (GLUCOPHAGE)  1000 MG tablet   rosuvastatin (CRESTOR) 40 MG tablet   Semaglutide (RYBELSUS) 14 MG TABS   insulin glargine, 2 Unit Dial, (TOUJEO MAX SOLOSTAR) 300 UNIT/ML Solostar Pen   gabapentin (NEURONTIN) 400 MG capsule   Other Relevant Orders   Fructosamine     Other   HLD (hyperlipidemia)   Relevant Medications   fenofibrate 160 MG tablet   metFORMIN (GLUCOPHAGE) 1000 MG tablet   rosuvastatin (CRESTOR) 40 MG tablet   vardenafil (LEVITRA) 20 MG tablet   Erectile dysfunction due to diseases classified elsewhere   Relevant Medications   vardenafil (LEVITRA) 20 MG tablet   Other Visit Diagnoses     Type 2 diabetes mellitus without complication, with long-term current use of insulin (HCC)       Relevant Medications   metFORMIN (GLUCOPHAGE) 1000 MG tablet   rosuvastatin (CRESTOR) 40 MG tablet   Semaglutide (RYBELSUS) 14 MG TABS   insulin glargine, 2 Unit Dial, (TOUJEO MAX SOLOSTAR) 300 UNIT/ML Solostar Pen   Other Relevant Orders   POCT CBG (Fasting - Glucose) (Completed)       Return in about 6 weeks (around 01/17/2023) for fu with labs prior.   Total time spent: 20 minutes  Luna Fuse, MD  12/06/2022   This document may have been prepared by [facility] Voice Recognition software and as such may include unintentional dictation errors.

## 2023-01-06 ENCOUNTER — Other Ambulatory Visit: Payer: Self-pay | Admitting: Internal Medicine

## 2023-01-06 DIAGNOSIS — I10 Essential (primary) hypertension: Secondary | ICD-10-CM

## 2023-01-24 ENCOUNTER — Ambulatory Visit: Payer: BC Managed Care – PPO | Admitting: Internal Medicine

## 2023-02-06 ENCOUNTER — Other Ambulatory Visit: Payer: Self-pay

## 2023-02-19 ENCOUNTER — Other Ambulatory Visit: Payer: Self-pay | Admitting: Internal Medicine

## 2023-02-19 DIAGNOSIS — E1142 Type 2 diabetes mellitus with diabetic polyneuropathy: Secondary | ICD-10-CM

## 2023-07-23 ENCOUNTER — Inpatient Hospital Stay
Admission: EM | Admit: 2023-07-23 | Discharge: 2023-07-25 | DRG: 638 | Disposition: A | Discharge status: Home / Self Care | Payer: MEDICAID | Attending: Internal Medicine | Admitting: Internal Medicine

## 2023-07-23 ENCOUNTER — Other Ambulatory Visit: Payer: Self-pay

## 2023-07-23 ENCOUNTER — Encounter: Payer: Self-pay | Admitting: Emergency Medicine

## 2023-07-23 DIAGNOSIS — E66811 Obesity, class 1: Secondary | ICD-10-CM | POA: Diagnosis present

## 2023-07-23 DIAGNOSIS — E669 Obesity, unspecified: Secondary | ICD-10-CM | POA: Diagnosis present

## 2023-07-23 DIAGNOSIS — Z91013 Allergy to seafood: Secondary | ICD-10-CM

## 2023-07-23 DIAGNOSIS — Z7984 Long term (current) use of oral hypoglycemic drugs: Secondary | ICD-10-CM

## 2023-07-23 DIAGNOSIS — Z720 Tobacco use: Secondary | ICD-10-CM | POA: Diagnosis present

## 2023-07-23 DIAGNOSIS — N179 Acute kidney failure, unspecified: Secondary | ICD-10-CM | POA: Diagnosis present

## 2023-07-23 DIAGNOSIS — I10 Essential (primary) hypertension: Secondary | ICD-10-CM | POA: Diagnosis present

## 2023-07-23 DIAGNOSIS — Z5971 Insufficient health insurance coverage: Secondary | ICD-10-CM

## 2023-07-23 DIAGNOSIS — E119 Type 2 diabetes mellitus without complications: Secondary | ICD-10-CM

## 2023-07-23 DIAGNOSIS — E1042 Type 1 diabetes mellitus with diabetic polyneuropathy: Secondary | ICD-10-CM | POA: Diagnosis present

## 2023-07-23 DIAGNOSIS — Z91120 Patient's intentional underdosing of medication regimen due to financial hardship: Secondary | ICD-10-CM

## 2023-07-23 DIAGNOSIS — Z794 Long term (current) use of insulin: Secondary | ICD-10-CM | POA: Diagnosis not present

## 2023-07-23 DIAGNOSIS — F1090 Alcohol use, unspecified, uncomplicated: Secondary | ICD-10-CM | POA: Diagnosis present

## 2023-07-23 DIAGNOSIS — Z79899 Other long term (current) drug therapy: Secondary | ICD-10-CM | POA: Diagnosis not present

## 2023-07-23 DIAGNOSIS — E782 Mixed hyperlipidemia: Secondary | ICD-10-CM

## 2023-07-23 DIAGNOSIS — E1110 Type 2 diabetes mellitus with ketoacidosis without coma: Principal | ICD-10-CM | POA: Diagnosis present

## 2023-07-23 DIAGNOSIS — Z833 Family history of diabetes mellitus: Secondary | ICD-10-CM | POA: Diagnosis not present

## 2023-07-23 DIAGNOSIS — R7989 Other specified abnormal findings of blood chemistry: Secondary | ICD-10-CM | POA: Insufficient documentation

## 2023-07-23 DIAGNOSIS — Z91018 Allergy to other foods: Secondary | ICD-10-CM | POA: Diagnosis not present

## 2023-07-23 DIAGNOSIS — E785 Hyperlipidemia, unspecified: Secondary | ICD-10-CM | POA: Diagnosis present

## 2023-07-23 DIAGNOSIS — E872 Acidosis, unspecified: Secondary | ICD-10-CM | POA: Insufficient documentation

## 2023-07-23 DIAGNOSIS — R748 Abnormal levels of other serum enzymes: Secondary | ICD-10-CM | POA: Diagnosis present

## 2023-07-23 DIAGNOSIS — E860 Dehydration: Secondary | ICD-10-CM | POA: Diagnosis present

## 2023-07-23 DIAGNOSIS — F17210 Nicotine dependence, cigarettes, uncomplicated: Secondary | ICD-10-CM | POA: Diagnosis present

## 2023-07-23 DIAGNOSIS — Z6831 Body mass index (BMI) 31.0-31.9, adult: Secondary | ICD-10-CM

## 2023-07-23 DIAGNOSIS — E1010 Type 1 diabetes mellitus with ketoacidosis without coma: Principal | ICD-10-CM | POA: Diagnosis present

## 2023-07-23 DIAGNOSIS — Z91041 Radiographic dye allergy status: Secondary | ICD-10-CM | POA: Diagnosis not present

## 2023-07-23 DIAGNOSIS — Z8249 Family history of ischemic heart disease and other diseases of the circulatory system: Secondary | ICD-10-CM

## 2023-07-23 DIAGNOSIS — F1010 Alcohol abuse, uncomplicated: Secondary | ICD-10-CM | POA: Diagnosis present

## 2023-07-23 LAB — URINALYSIS, ROUTINE W REFLEX MICROSCOPIC
Bilirubin Urine: NEGATIVE
Glucose, UA: >=500 mg/dL — AB
Ketones, ur: 80 mg/dL — AB
Leukocytes,Ua: NEGATIVE
Nitrite: NEGATIVE
Protein, ur: 100 mg/dL — AB
Specific Gravity, Urine: 1.027 (ref 1.005–1.030)
pH: 5 (ref 5.0–8.0)

## 2023-07-23 LAB — CBC
HCT: 47.4 % (ref 39.0–52.0)
Hemoglobin: 15.5 g/dL (ref 13.0–17.0)
MCH: 33.3 pg (ref 26.0–34.0)
MCHC: 32.7 g/dL (ref 30.0–36.0)
MCV: 101.7 fL — ABNORMAL HIGH (ref 80.0–100.0)
Platelets: 160 10*3/uL (ref 150–400)
RBC: 4.66 MIL/uL (ref 4.22–5.81)
RDW: 12.7 % (ref 11.5–15.5)
WBC: 8.1 10*3/uL (ref 4.0–10.5)
nRBC: 0 % (ref 0.0–0.2)

## 2023-07-23 LAB — BASIC METABOLIC PANEL WITH GFR
Anion gap: 26 — ABNORMAL HIGH (ref 5–15)
BUN: 23 mg/dL — ABNORMAL HIGH (ref 6–20)
CO2: 9 mmol/L — ABNORMAL LOW (ref 22–32)
Calcium: 9.2 mg/dL (ref 8.9–10.3)
Chloride: 99 mmol/L (ref 98–111)
Creatinine, Ser: 1.41 mg/dL — ABNORMAL HIGH (ref 0.61–1.24)
GFR, Estimated: 59 mL/min — ABNORMAL LOW (ref >60)
Glucose, Bld: 432 mg/dL — ABNORMAL HIGH (ref 70–99)
Potassium: 4.3 mmol/L (ref 3.5–5.1)
Sodium: 134 mmol/L — ABNORMAL LOW (ref 135–145)

## 2023-07-23 LAB — PHOSPHORUS: Phosphorus: 3.6 mg/dL (ref 2.5–4.6)

## 2023-07-23 LAB — COMPREHENSIVE METABOLIC PANEL WITH GFR
ALT: 11 U/L (ref 0–44)
AST: 11 U/L — ABNORMAL LOW (ref 15–41)
Albumin: 4.6 g/dL (ref 3.5–5.0)
Alkaline Phosphatase: 59 U/L (ref 38–126)
Anion gap: 30 — ABNORMAL HIGH (ref 5–15)
BUN: 20 mg/dL (ref 6–20)
CO2: 9 mmol/L — ABNORMAL LOW (ref 22–32)
Calcium: 10 mg/dL (ref 8.9–10.3)
Chloride: 93 mmol/L — ABNORMAL LOW (ref 98–111)
Creatinine, Ser: 1.43 mg/dL — ABNORMAL HIGH (ref 0.61–1.24)
GFR, Estimated: 58 mL/min — ABNORMAL LOW (ref >60)
Glucose, Bld: 513 mg/dL (ref 70–99)
Potassium: 5.1 mmol/L (ref 3.5–5.1)
Sodium: 132 mmol/L — ABNORMAL LOW (ref 135–145)
Total Bilirubin: 2.5 mg/dL — ABNORMAL HIGH (ref 0.0–1.2)
Total Protein: 8.2 g/dL — ABNORMAL HIGH (ref 6.5–8.1)

## 2023-07-23 LAB — CBG MONITORING, ED
Glucose-Capillary: 525 mg/dL (ref 70–99)
Glucose-Capillary: 370 mg/dL — ABNORMAL HIGH (ref 70–99)

## 2023-07-23 LAB — LIPASE, BLOOD: Lipase: 137 U/L — ABNORMAL HIGH (ref 11–51)

## 2023-07-23 LAB — MAGNESIUM: Magnesium: 1.8 mg/dL (ref 1.7–2.4)

## 2023-07-23 LAB — BETA-HYDROXYBUTYRIC ACID: Beta-Hydroxybutyric Acid: >8 mmol/L — ABNORMAL HIGH (ref 0.05–0.27)

## 2023-07-23 MED ORDER — INSULIN REGULAR(HUMAN) IN NACL 100-0.9 UT/100ML-% IV SOLN
INTRAVENOUS | Status: DC
Start: 2023-07-23 — End: 2023-07-23

## 2023-07-23 MED ORDER — DEXTROSE IN LACTATED RINGERS 5 % IV SOLN: INTRAVENOUS | Status: DC

## 2023-07-23 MED ORDER — ROSUVASTATIN CALCIUM 20 MG PO TABS
40.0000 mg | ORAL_TABLET | Freq: Every evening | ORAL | Status: DC
  Administered 2023-07-24: 40 mg via ORAL
  Filled 2023-07-23: qty 2

## 2023-07-23 MED ORDER — LORAZEPAM 2 MG/ML IJ SOLN: 0.0000 mg | Freq: Two times a day (BID) | INTRAMUSCULAR | Status: DC

## 2023-07-23 MED ORDER — INSULIN REGULAR(HUMAN) IN NACL 100-0.9 UT/100ML-% IV SOLN
INTRAVENOUS | Status: DC
  Administered 2023-07-23: 15 [IU]/h via INTRAVENOUS
  Filled 2023-07-23 (×2): qty 100

## 2023-07-23 MED ORDER — THIAMINE HCL 100 MG/ML IJ SOLN
100.0000 mg | Freq: Every day | INTRAMUSCULAR | Status: DC
Start: 2023-07-23 — End: 2023-07-25
  Filled 2023-07-23 (×2): qty 2

## 2023-07-23 MED ORDER — LACTATED RINGERS IV SOLN: INTRAVENOUS | Status: AC

## 2023-07-23 MED ORDER — HYDRALAZINE HCL 20 MG/ML IJ SOLN: 5.0000 mg | INTRAMUSCULAR | Status: DC | PRN

## 2023-07-23 MED ORDER — THIAMINE MONONITRATE 100 MG PO TABS
100.0000 mg | ORAL_TABLET | Freq: Every day | ORAL | Status: DC
Start: 2023-07-23 — End: 2023-07-25
  Administered 2023-07-24 – 2023-07-25 (×3): 100 mg via ORAL
  Filled 2023-07-23 (×3): qty 1

## 2023-07-23 MED ORDER — GABAPENTIN 300 MG PO CAPS
400.0000 mg | ORAL_CAPSULE | Freq: Three times a day (TID) | ORAL | Status: DC
  Administered 2023-07-24 – 2023-07-25 (×5): 400 mg via ORAL
  Filled 2023-07-23 (×5): qty 1

## 2023-07-23 MED ORDER — DEXTROSE IN LACTATED RINGERS 5 % IV SOLN
INTRAVENOUS | Status: DC
Start: 2023-07-23 — End: 2023-07-23

## 2023-07-23 MED ORDER — ACETAMINOPHEN 325 MG PO TABS: 650.0000 mg | ORAL_TABLET | Freq: Four times a day (QID) | ORAL | Status: DC | PRN

## 2023-07-23 MED ORDER — FOLIC ACID 1 MG PO TABS
1.0000 mg | ORAL_TABLET | Freq: Every day | ORAL | Status: DC
Start: 2023-07-23 — End: 2023-07-25
  Administered 2023-07-24 – 2023-07-25 (×3): 1 mg via ORAL
  Filled 2023-07-23 (×3): qty 1

## 2023-07-23 MED ORDER — CARVEDILOL 25 MG PO TABS
25.0000 mg | ORAL_TABLET | Freq: Two times a day (BID) | ORAL | Status: DC
  Administered 2023-07-24 – 2023-07-25 (×4): 25 mg via ORAL
  Filled 2023-07-23 (×2): qty 2
  Filled 2023-07-23: qty 1
  Filled 2023-07-23: qty 2

## 2023-07-23 MED ORDER — ONDANSETRON HCL 4 MG/2ML IJ SOLN
4.0000 mg | Freq: Three times a day (TID) | INTRAMUSCULAR | Status: DC | PRN
  Administered 2023-07-23: 4 mg via INTRAVENOUS
  Filled 2023-07-23: qty 2

## 2023-07-23 MED ORDER — LORAZEPAM 1 MG PO TABS: 1.0000 mg | ORAL_TABLET | ORAL | Status: DC | PRN

## 2023-07-23 MED ORDER — LACTATED RINGERS IV BOLUS
2000.0000 mL | Freq: Once | INTRAVENOUS | Status: AC
  Administered 2023-07-23: 2000 mL via INTRAVENOUS

## 2023-07-23 MED ORDER — LORAZEPAM 2 MG/ML IJ SOLN: 1.0000 mg | INTRAMUSCULAR | Status: DC | PRN

## 2023-07-23 MED ORDER — LORAZEPAM 2 MG/ML IJ SOLN: 0.0000 mg | Freq: Four times a day (QID) | INTRAMUSCULAR | Status: DC

## 2023-07-23 MED ORDER — ENOXAPARIN SODIUM 40 MG/0.4ML IJ SOSY
40.0000 mg | PREFILLED_SYRINGE | INTRAMUSCULAR | Status: DC
  Administered 2023-07-24 (×2): 40 mg via SUBCUTANEOUS
  Filled 2023-07-23 (×2): qty 0.4

## 2023-07-23 MED ORDER — SODIUM CHLORIDE 0.9 % IV BOLUS
1000.0000 mL | INTRAVENOUS | Status: AC
  Administered 2023-07-23: 1000 mL via INTRAVENOUS

## 2023-07-23 MED ORDER — FENOFIBRATE 160 MG PO TABS
160.0000 mg | ORAL_TABLET | Freq: Every morning | ORAL | Status: DC
  Administered 2023-07-24 – 2023-07-25 (×2): 160 mg via ORAL
  Filled 2023-07-23 (×2): qty 1

## 2023-07-23 MED ORDER — ADULT MULTIVITAMIN W/MINERALS CH
1.0000 | ORAL_TABLET | Freq: Every day | ORAL | Status: DC
  Administered 2023-07-24 – 2023-07-25 (×2): 1 via ORAL
  Filled 2023-07-23 (×2): qty 1

## 2023-07-23 MED ORDER — CHLORHEXIDINE GLUCONATE CLOTH 2 % EX PADS
6.0000 | MEDICATED_PAD | Freq: Every day | CUTANEOUS | Status: DC
  Administered 2023-07-24 – 2023-07-25 (×2): 6 via TOPICAL

## 2023-07-23 MED ORDER — DEXTROSE 50 % IV SOLN: 0.0000 mL | INTRAVENOUS | Status: DC | PRN

## 2023-07-23 MED ORDER — AMLODIPINE BESYLATE 10 MG PO TABS
10.0000 mg | ORAL_TABLET | Freq: Every day | ORAL | Status: DC
Start: 2023-07-23 — End: 2023-07-25
  Administered 2023-07-24 – 2023-07-25 (×3): 10 mg via ORAL
  Filled 2023-07-23 (×3): qty 1

## 2023-07-23 MED ORDER — NICOTINE 21 MG/24HR TD PT24
21.0000 mg | MEDICATED_PATCH | Freq: Every day | TRANSDERMAL | Status: DC
  Administered 2023-07-24 – 2023-07-25 (×3): 21 mg via TRANSDERMAL
  Filled 2023-07-23 (×3): qty 1

## 2023-07-23 MED ORDER — LACTATED RINGERS IV SOLN: INTRAVENOUS | Status: DC

## 2023-07-23 NOTE — ED Notes (Signed)
 Pt had a vomiting episode

## 2023-07-23 NOTE — ED Notes (Addendum)
 Reviewed pt's triage and lab results; acuity level changed and pt to be taken to room 4 for further evaluation; report called to care nurse Marliss Simple, RN

## 2023-07-23 NOTE — ED Triage Notes (Signed)
 Patient to ED via [facility] for N/V since Saturday. Denies abd pain or sick contacts. PT reports blood sugars have been all over the place since being sick.

## 2023-07-23 NOTE — ED Notes (Signed)
 MD Niu aware of pts HR of 126

## 2023-07-23 NOTE — ED Triage Notes (Signed)
 Arrives from home via [facility]  C/O vomiting since Saturday, none today, feeling weak. Decreased PO. Hx DM, does not check his sugar before taking his insulin .  CBG: 285.  Recent job loss.  120 99% RA 174/97

## 2023-07-23 NOTE — H&P (Signed)
 History and Physical    Randy Deleon:[id number] DOB: November 22, 1965 DOA: 07/23/2023  Referring MD/NP/PA:   PCP: Shari Daughters, MD   Patient coming from:  The patient is coming from home.     Chief Complaint: Generalized weakness, polyuria, polydipsia, nausea, vomiting  HPI: Randy Deleon is a 58 y.o. male with medical history significant of  type 1 DM, HTN, HLD, tobacco abuse, alcohol use, alcoholic pancreatitis, who presents with polyuria, polydipsia, generalized weakness, nausea, vomiting.  Patient states that he recently lost job and his insurance. He is worried about running out of insulin  so he has been taking it less than he is supposed to trying to stretch it out.  He developed generalized weakness, with polyuria and polydipsia.  No dysuria.  He also has nausea, with few times of nonbilious nonbloody vomiting in the past 2 days.  Denies abdominal pain.  No fever or chills.  No chest pain, cough, SOB.  Data reviewed independently and ED Course: pt was found to have  DKA (blood sugar 513, anion gap 30, HCO3 9, urine ketone 80), AKI with creatinine 1.43, BUN 20 and GFR 58 (recent baseline creatinine 0.67 11/13/2022), lipase 137, UA negative for UTI but positive ketone.  Temperature normal, blood pressure 158/97, heart rate 140 --> 126, RR 21, oxygen saturation 100% on room air.  Patient is placed in stepdown for observation.   EKG: I have personally reviewed.  Sinus rhythm, QTc 477, LAE, poor IV progression.   Review of Systems:   General: no fevers, chills, no body weight gain, has poor appetite, has fatigue HEENT: no blurry vision, hearing changes or sore throat Respiratory: no dyspnea, coughing, wheezing CV: no chest pain, no palpitations GI: has nausea, vomiting, no abdominal pain, diarrhea, constipation GU: no dysuria, burning on urination, has increased urinary frequency, no hematuria  Ext: no leg edema Neuro: no unilateral weakness, numbness, or tingling, no vision  change or hearing loss Skin: no rash, no skin tear. MSK: No muscle spasm, no deformity, no limitation of range of movement in spin Heme: No easy bruising.  Travel history: No recent long distant travel.   Allergy:  Allergies  Allergen Reactions   Contrast Media [Iodinated Contrast Media]     Renal failure   Other Rash and Other (See Comments)    Tested allergic to LIMA BEANS   Shellfish Allergy Rash and Other (See Comments)    TESTED ALLERGIC TO "SHELLFISH"    Past Medical History:  Diagnosis Date   Diabetes mellitus without complication (HCC)    Hypertension     Past Surgical History:  Procedure Laterality Date   COLONOSCOPY WITH PROPOFOL  N/A 04/16/2021   Procedure: COLONOSCOPY WITH PROPOFOL ;  Surgeon: Selena Daily, MD;  Location: [facility] ENDOSCOPY;  Service: Gastroenterology;  Laterality: N/A;   none     TONSILLECTOMY      Social History:  reports that he has been smoking cigarettes. He started smoking about 26 years ago. He has a 13.2 pack-year smoking history. He has never used smokeless tobacco. He reports that he does not currently use alcohol. He reports that he does not currently use drugs.  Family History:  Family History  Problem Relation Age of Onset   Diabetes Mother    Hypertension Mother    Congestive Heart Failure Father    Diabetes Father    Hypertension Father      Prior to Admission medications   Medication Sig Start Date End Date Taking? Authorizing Provider  acetaminophen  (TYLENOL ) 325 MG tablet Take 2 tablets (650 mg total) by mouth every 6 (six) hours as needed for mild pain or fever. 04/16/21   Alexander, Natalie, DO  amLODipine -benazepril  (LOTREL) 10-20 MG capsule TAKE 1 CAPSULE BY MOUTH EVERY MORNING 01/08/23   Shari Daughters, MD  carvedilol  (COREG ) 25 MG tablet Take 1 tablet (25 mg total) by mouth 2 (two) times daily. 08/18/22 02/14/23  Shari Daughters, MD  Cholecalciferol  125 MCG (5000 UT) capsule Take 5,000 Units by mouth  daily. Patient not taking: Reported on 08/18/2022 10/20/20   [provider]  Continuous Blood Gluc Sensor (FREESTYLE LIBRE 2 SENSOR) MISC Place 1 Units onto the skin See admin instructions. Every two weeks 02/17/22   [provider]  dapagliflozin propanediol (FARXIGA) 10 MG TABS tablet Take 10 mg by mouth daily.    [provider]  fenofibrate  160 MG tablet Take 1 tablet (160 mg total) by mouth every morning. 12/06/22 03/06/23  Shari Daughters, MD  gabapentin  (NEURONTIN ) 400 MG capsule Take 1 capsule (400 mg total) by mouth 3 (three) times daily. 12/06/22 03/06/23  Shari Daughters, MD  insulin  glargine, 2 Unit Dial, (TOUJEO  MAX SOLOSTAR) 300 UNIT/ML Solostar Pen Inject 100 Units into the skin 2 (two) times daily. 12/06/22 03/06/23  Shari Daughters, MD  insulin  lispro (HUMALOG) 100 UNIT/ML KwikPen Inject 0-45 Units into the skin 3 (three) times daily. 11/10/21   [provider]  metFORMIN  (GLUCOPHAGE ) 1000 MG tablet Take 1 tablet (1,000 mg total) by mouth 2 (two) times daily. 12/06/22 03/06/23  Shari Daughters, MD  pioglitazone  (ACTOS ) 45 MG tablet Take 1 tablet (45 mg total) by mouth every morning. 08/18/22 02/14/23  Shari Daughters, MD  rosuvastatin  (CRESTOR ) 40 MG tablet Take 1 tablet (40 mg total) by mouth every evening. 12/06/22 03/06/23  Shari Daughters, MD  sildenafil (VIAGRA) 100 MG tablet Take 100 mg by mouth daily as needed. 06/16/19   [provider]  vardenafil  (LEVITRA ) 20 MG tablet Take 1 tablet (20 mg total) by mouth daily as needed for erectile dysfunction. Take one tab four to six hours before intercourse 12/06/22 01/05/23  Shari Daughters, MD    Physical Exam: Vitals:   07/23/23 1804 07/23/23 1809 07/23/23 2043 07/23/23 2045  BP: (!) 148/103   (!) 158/97  Pulse: (!) 140   (!) 126  Resp: 18   (!) 21  Temp: 97.9 F (36.6 C)     TempSrc: Oral     SpO2: 99%  100% 100%  Weight:  95.3 kg    Height:  5\' 9"  (1.753 m)      General: Not in acute distress.  Dry mucous membrane HEENT:       Eyes: PERRL, EOMI, no jaundice       ENT: No discharge from the ears and nose, no pharynx injection, no tonsillar enlargement.        Neck: No JVD, no bruit, no mass felt. Heme: No neck lymph node enlargement. Cardiac: S1/S2, RRR, No murmurs, No gallops or rubs. Respiratory: No rales, wheezing, rhonchi or rubs. GI: Soft, nondistended, nontender, no rebound pain, no organomegaly, BS present. GU: No hematuria Ext: No pitting leg edema bilaterally. 1+DP/PT pulse bilaterally. Musculoskeletal: No joint deformities, No joint redness or warmth, no limitation of ROM in spin. Skin: No rashes.  Neuro: Alert, oriented X3, cranial nerves II-XII grossly intact, moves all extremities normally.  Psych: Patient is not psychotic, no suicidal or hemocidal ideation.  Labs on Admission: I have personally reviewed following labs and imaging studies  CBC: Recent Labs  Lab 07/23/23 1806  WBC 8.1  HGB 15.5  HCT 47.4  MCV 101.7*  PLT 160   Basic Metabolic Panel: Recent Labs  Lab 07/23/23 1806  NA 132*  K 5.1  CL 93*  CO2 9*  GLUCOSE 513*  BUN 20  CREATININE 1.43*  CALCIUM  10.0   GFR: Estimated Creatinine Clearance: 66.5 mL/min (A) (by C-G formula based on SCr of 1.43 mg/dL (H)). Liver Function Tests: Recent Labs  Lab 07/23/23 1806  AST 11*  ALT 11  ALKPHOS 59  BILITOT 2.5*  PROT 8.2*  ALBUMIN 4.6   Recent Labs  Lab 07/23/23 1806  LIPASE 137*   No results for input(s): "AMMONIA" in the last 168 hours. Coagulation Profile: No results for input(s): "INR", "PROTIME" in the last 168 hours. Cardiac Enzymes: No results for input(s): "CKTOTAL", "CKMB", "CKMBINDEX", "TROPONINI" in the last 168 hours. BNP (last 3 results) No results for input(s): "PROBNP" in the last 8760 hours. HbA1C: No results for input(s): "HGBA1C" in the last 72 hours. CBG: Recent Labs  Lab 07/23/23 2132  GLUCAP 525*   Lipid Profile: No  results for input(s): "CHOL", "HDL", "LDLCALC", "TRIG", "CHOLHDL", "LDLDIRECT" in the last 72 hours. Thyroid Function Tests: No results for input(s): "TSH", "T4TOTAL", "FREET4", "T3FREE", "THYROIDAB" in the last 72 hours. Anemia Panel: No results for input(s): "VITAMINB12", "FOLATE", "FERRITIN", "TIBC", "IRON", "RETICCTPCT" in the last 72 hours. Urine analysis:    Component Value Date/Time   COLORURINE YELLOW (A) 07/23/2023 1806   APPEARANCEUR CLEAR (A) 07/23/2023 1806   LABSPEC 1.027 07/23/2023 1806   PHURINE 5.0 07/23/2023 1806   GLUCOSEU >=500 (A) 07/23/2023 1806   HGBUR MODERATE (A) 07/23/2023 1806   BILIRUBINUR NEGATIVE 07/23/2023 1806   KETONESUR 80 (A) 07/23/2023 1806   PROTEINUR 100 (A) 07/23/2023 1806   NITRITE NEGATIVE 07/23/2023 1806   LEUKOCYTESUR NEGATIVE 07/23/2023 1806   Sepsis Labs: @LABRCNTIP (procalcitonin:4,lacticidven:4) )No results found for this or any previous visit (from the past 240 hours).   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  Mental status normal.  - Place in SDU for obs - IVF:  2L of LR, and 1of NS bolus - start insulin  drip with BMP q4h - IVF: LR at 125 cc/h, will switch to D5-LR at 125 cc/h when CBG<250 - replete K as needed - Zofran  prn nausea  - NPO  - consult to diabetic educator  Type 1 diabetes mellitus with peripheral neuropathy [facility]): Recent A1c 10.5, poorly controlled.  Patient is supposed to take glargine insulin  100 mg twice  daily, Humalog, metformin . - On DKA protocol as above - Consult TOC for medication need due to loss of insurance  Elevated lipase: Lipase 137.  Please has a history of alcoholic pancreatitis.  Patient denies any abdominal pain today.  Patient may have mild pancreatitis. -IV fluid as above  HTN (hypertension): -Switch Lotrel to amlodipine  10 mg only, hold benazepril   due to AKI -Coreg  - IV hydralazine  as needed  AKI (acute kidney injury) (HCC): Likely due to dehydration and continuation for benazepril  -Hold benazepril  - IV fluid as above  HLD (hyperlipidemia) -Crestor , fenofibrate   Tobacco use: - Did counsel about importance of quitting smoking - Nicotine  patch  Alcohol use: Patient drinks liquor every day, currently no signs of withdrawal - Did counseling about importance of quitting alcohol use - CIWA protocol  Obesity (BMI 30-39.9): Body weight 95.3 kg, BMI 31.01 - Encourage losing weight - Exercise and healthy diet      DVT ppx:  SQ Lovenox   Code Status: Full code   Family Communication:     not done, no family member is at bed side.    Disposition Plan:  Anticipate discharge back to previous environment  Consults called:  none  Admission status and Level of care: Stepdown:    for obs       Dispo: The patient is from: Home              Anticipated d/c is to: Home              Anticipated d/c date is: 1 day              Patient currently is not medically stable to d/c.    Severity of Illness:  The appropriate patient status for this patient is OBSERVATION. Observation status is judged to be reasonable and necessary in order to provide the required intensity of service to ensure the patient's safety. [name] presenting symptoms, physical exam findings, and initial radiographic and laboratory data in the context of their medical condition is felt to place them at decreased risk for further clinical deterioration. Furthermore, it is anticipated that the  patient will be medically stable for discharge from the hospital within 2 midnights of admission.        Date of Service 07/23/2023    Fidencio Hue [facility]   If 7PM-7AM, please contact night-coverage [email or web address] 07/23/2023, 9:55 PM

## 2023-07-23 NOTE — ED Provider Notes (Signed)
 [facility] Provider Note    Event Date/Time   First MD Initiated Contact with Patient 07/23/23 2033     (approximate)   History   Chief Complaint: Emesis   HPI  Randy Deleon is a 58 y.o. male with a history of hypertension and diabetes who comes ED complaining of generalized weakness for the last 2 days, associated nausea and vomiting.  Denies diarrhea, no fevers chills or pain.  Does report that he recently lost his insurance and he was worried about running out of insulin  so he has been taking it less than he is supposed to trying to stretch it out.  He has noticed polyuria but trying to drink increased water.        Past Medical History:  Diagnosis Date   Diabetes mellitus without complication [facility])    Hypertension     Current Outpatient Rx   Order #: [id number] Class: OTC   Order #: [id number] Class: Normal   Order #: [id number] Class: Normal   Order #: [id number] Class: Historical Med   Order #: [id number] Class: Historical Med   Order #: [id number] Class: Historical Med   Order #: [id number] Class: Normal   Order #: [id number] Class: Normal   Order #: [id number] Class: Normal   Order #: [id number] Class: Historical Med   Order #: [id number] Class: Normal   Order #: [id number] Class: Normal   Order #: [id number] Class: Normal   Order #: [id number] Class: Historical Med   Order #: [id number] Class: Normal    Past Surgical History:  Procedure Laterality Date   COLONOSCOPY WITH PROPOFOL  N/A 04/16/2021   Procedure: COLONOSCOPY WITH PROPOFOL ;  Surgeon: Selena Daily, MD;  Location: [facility] ENDOSCOPY;  Service: Gastroenterology;  Laterality: N/A;   none     TONSILLECTOMY      Physical Exam   Triage Vital Signs: ED Triage Vitals  Encounter Vitals Group     BP 07/23/23 1804 (!) 148/103     Systolic BP Percentile --      Diastolic BP Percentile --      Pulse Rate 07/23/23 1804 (!) 140     Resp 07/23/23 1804 18     Temp 07/23/23 1804 97.9 F (36.6 C)      Temp Source 07/23/23 1804 Oral     SpO2 07/23/23 1804 99 %     Weight 07/23/23 1809 210 lb (95.3 kg)     Height 07/23/23 1809 5\' 9"  (1.753 m)     Head Circumference --      Peak Flow --      Pain Score 07/23/23 1809 0     Pain Loc --      Pain Education --      Exclude from Growth Chart --     Most recent vital signs: Vitals:   07/23/23 2043 07/23/23 2045  BP:  (!) 158/97  Pulse:  (!) 126  Resp:  (!) 21  Temp:    SpO2: 100% 100%    General: Awake, no distress.  CV:  Good peripheral perfusion.  Tachycardia heart rate 140 Resp:  Normal effort.  Clear to auscultation bilaterally Abd:  No distention.  Soft nontender Other:  Dry oral mucosa.  Normal mental status.  No soft tissue inflammatory changes.   ED Results / Procedures / Treatments   Labs (all labs ordered are listed, but only abnormal results are displayed) Labs Reviewed  LIPASE, BLOOD - Abnormal; Notable for the following components:      Result Value   Lipase 137 (*)    All  other components within normal limits  COMPREHENSIVE METABOLIC PANEL WITH GFR - Abnormal; Notable for the following components:   Sodium 132 (*)    Chloride 93 (*)    CO2 9 (*)    Glucose, Bld 513 (*)    Creatinine, Ser 1.43 (*)    Total Protein 8.2 (*)    AST 11 (*)    Total Bilirubin 2.5 (*)    GFR, Estimated 58 (*)    Anion gap 30 (*)    All other components within normal limits  CBC - Abnormal; Notable for the following components:   MCV 101.7 (*)    All other components within normal limits  URINALYSIS, ROUTINE W REFLEX MICROSCOPIC - Abnormal; Notable for the following components:   Color, Urine YELLOW (*)    APPearance CLEAR (*)    Glucose, UA >=500 (*)    Hgb urine dipstick MODERATE (*)    Ketones, ur 80 (*)    Protein, ur 100 (*)    Bacteria, UA RARE (*)    All other components within normal limits  BETA-HYDROXYBUTYRIC ACID  BETA-HYDROXYBUTYRIC ACID  BETA-HYDROXYBUTYRIC ACID  BETA-HYDROXYBUTYRIC ACID      EKG    RADIOLOGY    PROCEDURES:  .Critical Care  Performed by: Jacquie Maudlin, MD Authorized by: Jacquie Maudlin, MD   Critical care provider statement:    Critical care time (minutes):  35   Critical care time was exclusive of:  Separately billable procedures and treating other patients   Critical care was necessary to treat or prevent imminent or life-threatening deterioration of the following conditions:  Dehydration and metabolic crisis   Critical care was time spent personally by me on the following activities:  Development of treatment plan with patient or surrogate, discussions with consultants, evaluation of patient's response to treatment, examination of patient, obtaining history from patient or surrogate, ordering and performing treatments and interventions, ordering and review of laboratory studies, ordering and review of radiographic studies, pulse oximetry, re-evaluation of patient's condition and review of old charts   Care discussed with: admitting provider      MEDICATIONS ORDERED IN ED: Medications  insulin  regular, human (MYXREDLIN ) 100 units/ 100 mL infusion (has no administration in time range)  lactated ringers  infusion (has no administration in time range)  dextrose  5 % in lactated ringers  infusion (has no administration in time range)  dextrose  50 % solution 0-50 mL (has no administration in time range)  sodium chloride  0.9 % bolus 1,000 mL (has no administration in time range)     IMPRESSION / MDM / ASSESSMENT AND PLAN / ED COURSE  I reviewed the triage vital signs and the nursing notes.  DDx: DKA, electrolyte derangement, AKI, UTI, anemia  Patient's presentation is most consistent with acute presentation with potential threat to life or bodily function.  Patient presents with tachycardia, appearing dehydrated in the setting of hyperglycemia due to under utilization of his insulin  related to financial concerns.  He is tachycardic but with  okay blood pressure.  Labs reveal severe anion gap metabolic acidosis with large ketones in the urine consistent with DKA.  Will start 2 L IV fluid bolus, insulin  infusion, plan to admit.       FINAL CLINICAL IMPRESSION(S) / ED DIAGNOSES   Final diagnoses:  Type 2 diabetes mellitus with ketoacidosis without coma, with long-term current use of insulin  (HCC)     Rx / DC Orders   ED Discharge Orders     None  Note:  This document was prepared using Dragon voice recognition software and may include unintentional dictation errors.   Jacquie Maudlin, MD 07/23/23 2056

## 2023-07-24 DIAGNOSIS — R7989 Other specified abnormal findings of blood chemistry: Secondary | ICD-10-CM | POA: Insufficient documentation

## 2023-07-24 DIAGNOSIS — F1090 Alcohol use, unspecified, uncomplicated: Secondary | ICD-10-CM

## 2023-07-24 DIAGNOSIS — E872 Acidosis, unspecified: Secondary | ICD-10-CM | POA: Insufficient documentation

## 2023-07-24 LAB — GLUCOSE, CAPILLARY
Glucose-Capillary: 133 mg/dL — ABNORMAL HIGH (ref 70–99)
Glucose-Capillary: 147 mg/dL — ABNORMAL HIGH (ref 70–99)
Glucose-Capillary: 152 mg/dL — ABNORMAL HIGH (ref 70–99)
Glucose-Capillary: 153 mg/dL — ABNORMAL HIGH (ref 70–99)
Glucose-Capillary: 178 mg/dL — ABNORMAL HIGH (ref 70–99)
Glucose-Capillary: 192 mg/dL — ABNORMAL HIGH (ref 70–99)
Glucose-Capillary: 194 mg/dL — ABNORMAL HIGH (ref 70–99)
Glucose-Capillary: 197 mg/dL — ABNORMAL HIGH (ref 70–99)
Glucose-Capillary: 218 mg/dL — ABNORMAL HIGH (ref 70–99)
Glucose-Capillary: 218 mg/dL — ABNORMAL HIGH (ref 70–99)
Glucose-Capillary: 224 mg/dL — ABNORMAL HIGH (ref 70–99)
Glucose-Capillary: 241 mg/dL — ABNORMAL HIGH (ref 70–99)
Glucose-Capillary: 270 mg/dL — ABNORMAL HIGH (ref 70–99)
Glucose-Capillary: 315 mg/dL — ABNORMAL HIGH (ref 70–99)

## 2023-07-24 LAB — BASIC METABOLIC PANEL WITH GFR
Anion gap: 18 — ABNORMAL HIGH (ref 5–15)
Anion gap: 11 (ref 5–15)
BUN: 18 mg/dL (ref 6–20)
BUN: 17 mg/dL (ref 6–20)
CO2: 13 mmol/L — ABNORMAL LOW (ref 22–32)
CO2: 19 mmol/L — ABNORMAL LOW (ref 22–32)
Calcium: 8.9 mg/dL (ref 8.9–10.3)
Calcium: 9.1 mg/dL (ref 8.9–10.3)
Chloride: 106 mmol/L (ref 98–111)
Chloride: 107 mmol/L (ref 98–111)
Creatinine, Ser: 1.08 mg/dL (ref 0.61–1.24)
Creatinine, Ser: 0.87 mg/dL (ref 0.61–1.24)
GFR, Estimated: >60 mL/min (ref >60)
GFR, Estimated: >60 mL/min (ref >60)
Glucose, Bld: 251 mg/dL — ABNORMAL HIGH (ref 70–99)
Glucose, Bld: 193 mg/dL — ABNORMAL HIGH (ref 70–99)
Potassium: 3.5 mmol/L (ref 3.5–5.1)
Potassium: 3.6 mmol/L (ref 3.5–5.1)
Sodium: 137 mmol/L (ref 135–145)
Sodium: 137 mmol/L (ref 135–145)

## 2023-07-24 LAB — MRSA NEXT GEN BY PCR, NASAL: MRSA by PCR Next Gen: NOT DETECTED

## 2023-07-24 LAB — HIV ANTIBODY (ROUTINE TESTING W REFLEX): HIV Screen 4th Generation wRfx: NONREACTIVE

## 2023-07-24 MED ORDER — INSULIN ASPART 100 UNIT/ML IJ SOLN
0.0000 [IU] | Freq: Every day | INTRAMUSCULAR | Status: DC
  Administered 2023-07-24: 3 [IU] via SUBCUTANEOUS
  Filled 2023-07-24: qty 1

## 2023-07-24 MED ORDER — ORAL CARE MOUTH RINSE: 15.0000 mL | OROMUCOSAL | Status: DC | PRN

## 2023-07-24 MED ORDER — INSULIN ASPART 100 UNIT/ML IJ SOLN
0.0000 [IU] | Freq: Three times a day (TID) | INTRAMUSCULAR | Status: DC
  Administered 2023-07-24 (×2): 2 [IU] via SUBCUTANEOUS
  Administered 2023-07-25: 7 [IU] via SUBCUTANEOUS
  Administered 2023-07-25: 5 [IU] via SUBCUTANEOUS
  Filled 2023-07-24 (×4): qty 1

## 2023-07-24 MED ORDER — INSULIN ASPART 100 UNIT/ML IJ SOLN
4.0000 [IU] | Freq: Three times a day (TID) | INTRAMUSCULAR | Status: DC
  Administered 2023-07-24 – 2023-07-25 (×4): 4 [IU] via SUBCUTANEOUS
  Filled 2023-07-24 (×4): qty 1

## 2023-07-24 MED ORDER — INSULIN GLARGINE-YFGN 100 UNIT/ML ~~LOC~~ SOLN
60.0000 [IU] | SUBCUTANEOUS | Status: DC
  Administered 2023-07-24 – 2023-07-25 (×2): 60 [IU] via SUBCUTANEOUS
  Filled 2023-07-24 (×2): qty 0.6

## 2023-07-24 MED ORDER — SODIUM BICARBONATE 650 MG PO TABS
1300.0000 mg | ORAL_TABLET | Freq: Two times a day (BID) | ORAL | Status: DC
  Administered 2023-07-24 – 2023-07-25 (×3): 1300 mg via ORAL
  Filled 2023-07-24 (×4): qty 2

## 2023-07-24 MED ORDER — KCL-LACTATED RINGERS-D5W 20 MEQ/L IV SOLN
INTRAVENOUS | Status: DC
Start: 2023-07-24 — End: 2023-07-24
  Filled 2023-07-24 (×2): qty 1000

## 2023-07-24 NOTE — Hospital Course (Signed)
 Randy Deleon is a 58 y.o. male with medical history significant of  type 1 DM, HTN, HLD, tobacco abuse, alcohol use, alcoholic pancreatitis, who presents with polyuria, polydipsia, generalized weakness, nausea, vomiting.  Patient was on Lantus  100 units daily, lost insurance a month ago, he has reduced his insulin  dose to 15 units daily for 1 month. Upon arriving the hospital, he was found to have DKA with glucose 513, anion gap 30.  He was treated with insulin  drip in ICU. Anion gap closed 5/6.  Transition to subcu insulin .

## 2023-07-24 NOTE — Inpatient Diabetes Management (Addendum)
 Inpatient Diabetes Program Recommendations  AACE/ADA: New Consensus Statement on Inpatient Glycemic Control  Target Ranges:  Prepandial:   less than 140 mg/dL      Peak postprandial:   less than 180 mg/dL (1-2 hours)      Critically ill patients:  140 - 180 mg/dL    Latest Reference Range & Units 07/24/23 00:28 07/24/23 01:51 07/24/23 02:49 07/24/23 03:56 07/24/23 05:03 07/24/23 06:04 07/24/23 07:04 07/24/23 08:12  Glucose-Capillary 70 - 99 mg/dL 161 (H) 096 (H) 045 (H) 192 (H) 178 (H) 153 (H) 152 (H) 147 (H)    Latest Reference Range & Units 07/23/23 18:06 07/23/23 22:21 07/24/23 01:27 07/24/23 04:36  CO2 22 - 32 mmol/L 9 (L) 9 (L) 13 (L) 19 (L)  Glucose 70 - 99 mg/dL 409 (HH) 811 (H) 914 (H) 193 (H)  Anion gap 5 - 15  30 (H) 26 (H) 18 (H) 11   Review of Glycemic Control  Diabetes history: DM2 Outpatient Diabetes medications: Toujeo  100 units daily (taking less due to rationing), Humalog 0-45 units TID with meals (not taking recently), Actos  45 mg daily(not taking), Metformin  1000 mg QAM, Rybelsus  14 mg daily (not taking), Farxiga 10 mg daily (not taking) Current orders for Inpatient glycemic control: IV insulin   Inpatient Diabetes Program Recommendations:    Insulin : Once provider is ready to transition from IV to SQ insulin , please consider ordering Semglee  38 units Q24H (based on 95.3 kg x 0.4 units), CBGs Q4H, Novolog  0-15 units Q4H, and Novolog  4 units TID with meals for meal coverage if patient eats at least 50% of meals.  Outpatient DM: If patient is able to get medications from [facility], please provide Rx for Basaglar  pens (#[id number]), Humalog pens (#[id number]), insulin  pen needles (#[id number]), glucose monitoring kit (#[id number]), and oral DM medications that are continued at discharge.  NOTE: Patient admitted with DKA with initial lab glucose of 513 mg/dl on 06/19/30. Per H&P, patient recently lost insurance and job and patient has been rationing insulin . TOC already  consulted. Hopefully patient will be able to get medications from [facility] at discharge. Will plan to talk with patient today.  Addendum 07/24/23@11 :55-Spoke with patient at bedside about diabetes and home regimen for diabetes control. Patient drowsy but answered questions appropriately.  Patient confirms that he lost his insurance and has been rationing insulin . Patient reports that he was only taking Metformin  and Toujeo . Patient states that he is almost out of Toujeo . Asked specifically about Actos , Rybelsus , and Farxiga and patient reports that he was not taking any of those.  Patient last seen PCP in September 2024 and he is not able to pay copay to see PCP.  Patient reports that he would like to go to a clinic for uninsured.  Patient states he had been taking Toujeo  100 units daily when he was able to get needed medications but recently he has been taking much less trying to make it last. Patient reports he has not been using Humalog recently and he is not sure if he has any Humalog at home or not.  Patient has not been checking glucose; he will need Rx for new glucose monitoring kit.  Discussed glucose and A1C goals. Discussed importance of checking CBGs and maintaining good CBG control to prevent long-term and short-term complications. Informed patient that TOC has been consulted to assist with medications and follow up. Encouraged patient to take medications as prescribed at discharge and to get established with a local clinic for primary care,  consistent follow up, and assistance with DM management.   Patient verbalized understanding of information discussed and reports no further questions at this time related to diabetes.  Thanks, Beacher Limerick, RN, MSN, CDCES Diabetes Coordinator Inpatient Diabetes Program [phone] (Team Pager from 8am to 5pm)

## 2023-07-24 NOTE — Discharge Instructions (Signed)

## 2023-07-24 NOTE — Progress Notes (Signed)
  Progress Note   Patient: Randy Deleon EAV:[id number] DOB: 16-Jun-1965 DOA: 07/23/2023     1 DOS: the patient was seen and examined on 07/24/2023   Brief hospital course: Randy Deleon is a 58 y.o. male with medical history significant of  type 1 DM, HTN, HLD, tobacco abuse, alcohol use, alcoholic pancreatitis, who presents with polyuria, polydipsia, generalized weakness, nausea, vomiting.  Patient was on Lantus  100 units daily, lost insurance a month ago, he has reduced his insulin  dose to 15 units daily for 1 month. Upon arriving the hospital, he was found to have DKA with glucose 513, anion gap 30.  He was treated with insulin  drip in ICU. Anion gap closed 5/6.  Transition to subcu insulin .   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 Acute kidney injury secondary to DKA. Normal anion gap metabolic acidosis. DKA has resolved after insulin  drip.  Transition to subcu insulin  and discontinue fluids. After correction of anion gap metabolic acidosis, patient now has normal anion gap metabolic acidosis.  Sodium bicarb started. Renal function has improved to normal. Confirm with the patient that he was supposed to take 100 units of the long-acting insulin , but was using 15 units instead.  At this point, just doing regular I will start 60 units, insulin  glargine, 4 units of scheduled NovoLog  and sliding scale insulin .  Will need to adjust dose tomorrow before discharge. TOC consult to help with home medicine.  Tobacco abuse Alcohol abuse. Patient currently has no  evidence of withdrawal.  Continue CIWA protocol.  Monitor electrolytes and magnesium  level.  Class I obesity with BMI 31.01. Diet and exercise.    Subjective:  Patient feels better today, no abdominal pain or nausea vomiting.  Physical Exam: Vitals:   07/24/23 0630 07/24/23 0700 07/24/23 0800 07/24/23 0900  BP: 108/74 116/80 107/67 111/71  Pulse: 95 93 91 94  Resp: 13 15 12 14   Temp:   97.7 F (36.5 C)   TempSrc:   Axillary   SpO2: 96% 95% 97% 98%  Weight:      Height:       General exam: Appears calm and comfortable  Respiratory system: Clear to auscultation. Respiratory effort normal. Cardiovascular system: S1 & S2 heard, RRR. No JVD, murmurs, rubs, gallops or clicks. No pedal edema. Gastrointestinal system: Abdomen is nondistended, soft and nontender. No organomegaly or masses felt. Normal bowel sounds heard. Central nervous system: Alert and oriented. No focal neurological deficits. Extremities: Symmetric 5 x 5 power. Skin: No rashes, lesions or ulcers Psychiatry: Judgement and insight appear normal. Mood & affect appropriate.    Data Reviewed:  Lab results reviewed.  Family Communication: None  Disposition: Status is: Inpatient Remains inpatient appropriate because: Severity of disease.     Time spent: 50 minutes  Author: Donaciano Frizzle, MD 07/24/2023 10:25 AM  For on call review [email or web address].

## 2023-07-24 NOTE — TOC Initial Note (Signed)
 Transition of Care [facility]) - Initial/Assessment Note    Patient Details  Name: Randy Deleon MRN: [id number] Date of Birth: 13-Jul-1965  Transition of Care [facility]) CM/SW Contact:    Danica Camarena A Madysin Crisp, RN Phone Number: 07/24/2023, 4:11 PM  Clinical Narrative:                 Chart reviewed.  Noted that patient was admitted with Diabetic Ketoacidosis. Patient required an insulin  drip on this admission.    I have meet with patient at bedside.  He informs me that prior to admission he lived at home with his mother.  He informs me that his mother lives with him.  He reports that he recently lost his job.  He reports that he did have a PCP.  He reported that he was going to [facility] after he lost his insurance because they would see him based on a sliding scale.  Mrs. Geske reports that provided medications for him one time.    I have spoken to him about [facility] and patient assistance programs for Lantus , and short action insulins. I have left information at bedside for patient.    Patient reports that he has obtained a lawyer to assist him with apply for disability and Medicaid.  He reports that he has been advised not to apply for anything as the Lawyer's office is assisting with his claim.    I have informed him that [facility] will assist with discharge medications.    TOC will continue to follow for discharge planning.      Expected Discharge Plan: Home/Self Care Barriers to Discharge: Inadequate or no insurance   Patient Goals and CMS Choice            Expected Discharge Plan and Services   Discharge Planning Services: CM Consult                                          Prior Living Arrangements/Services   Lives with:: Parents (Patient reports that his mother lives with him.) Patient language and need for interpreter reviewed:: Yes Do you feel safe going back to the place where you live?: Yes               Activities of Daily Living   ADL Screening  (condition at time of admission) Independently performs ADLs?: Yes (appropriate for developmental age) Is the patient deaf or have difficulty hearing?: No Does the patient have difficulty seeing, even when wearing glasses/contacts?: No Does the patient have difficulty concentrating, remembering, or making decisions?: No  Permission Sought/Granted                  Emotional Assessment Appearance:: Appears stated age     Orientation: : Oriented to Self, Oriented to Place, Oriented to  Time, Oriented to Situation      Admission diagnosis:  DKA (diabetic ketoacidosis) (HCC) [E11.10] Type 2 diabetes mellitus with ketoacidosis without coma, with long-term current use of insulin  (HCC) [E11.10, Z79.4] Patient Active Problem List   Diagnosis Date Noted   Normal anion gap metabolic acidosis 07/24/2023   Pseudohyponatremia 07/24/2023   Elevated lipase 07/23/2023   Obesity (BMI 30-39.9) 07/23/2023   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) 07/23/2023   Alcohol use 07/23/2023   Diabetic polyneuropathy associated with type 2 diabetes mellitus (HCC) 12/06/2022   Erectile dysfunction due to diseases  classified elsewhere 12/06/2022   Dehydration    Polyp of colon    Hyperosmolar hyperglycemic state (HHS) (HCC) 04/15/2021   Tobacco use 04/15/2021   HLD (hyperlipidemia) 04/15/2021   HTN (hypertension) 04/15/2021   AKI (acute kidney injury) (HCC) 03/23/2018   Acute pancreatitis 03/23/2018   Pancreatitis, alcoholic, acute 03/21/2018   Respiratory failure, acute (HCC) 03/21/2018   Diabetic ketoacidosis without coma associated with type 1 diabetes mellitus (HCC) 03/20/2018   DKA, type 2 (HCC) 11/23/2017   Generalized weakness 11/19/2017   Abnormal weight loss 11/19/2017   PCP:  Shari Daughters, MD Pharmacy:   [facility] [phone] - [place], [place] - [address] AT [facility] [address] [place] [address] [place] [address] Phone: [phone] Fax:  [phone]     Social Drivers of Health (SDOH) Social History: SDOH Screenings   Food Insecurity: No Food Insecurity (07/23/2023)  Housing: Low Risk  (07/23/2023)  Transportation Needs: No Transportation Needs (07/23/2023)  Utilities: Not At Risk (07/23/2023)  Tobacco Use: High Risk (07/23/2023)   SDOH Interventions:     Readmission Risk Interventions     No data to display

## 2023-07-24 NOTE — TOC Progression Note (Signed)
 Transition of Care [facility]) - Progression Note    Patient Details  Name: Randy Deleon MRN: [id number] Date of Birth: Sep 03, 1965  Transition [facility] Care [facility]) CM/SW Contact  Earlyn Sylvan A Jayvyn Haselton, RN Phone Number: 07/24/2023, 4:24 PM  Clinical Narrative:    Substance abuse resources added to patient's discharge instructions.     Expected Discharge Plan: Home/Self Care Barriers to Discharge: Inadequate or no insurance  Expected Discharge Plan and Services   Discharge Planning Services: CM Consult                                           Social Determinants of Health (SDOH) Interventions SDOH Screenings   Food Insecurity: No Food Insecurity (07/23/2023)  Housing: Low Risk  (07/23/2023)  Transportation Needs: No Transportation Needs (07/23/2023)  Utilities: Not At Risk (07/23/2023)  Tobacco Use: High Risk (07/23/2023)    Readmission Risk Interventions     No data to display

## 2023-07-24 NOTE — Plan of Care (Signed)
  Problem: Coping: Goal: Ability to adjust to condition or change in health will improve Outcome: Progressing   Problem: Fluid Volume: Goal: Ability to maintain a balanced intake and output will improve Outcome: Progressing   Problem: Metabolic: Goal: Ability to maintain appropriate glucose levels will improve Outcome: Progressing   Problem: Skin Integrity: Goal: Risk for impaired skin integrity will decrease Outcome: Progressing   Problem: Cardiac: Goal: Ability to maintain an adequate cardiac output will improve Outcome: Progressing   Problem: Respiratory: Goal: Will regain and/or maintain adequate ventilation Outcome: Progressing   Problem: Clinical Measurements: Goal: Will remain free from infection Outcome: Progressing

## 2023-07-25 ENCOUNTER — Other Ambulatory Visit: Payer: Self-pay

## 2023-07-25 DIAGNOSIS — E1110 Type 2 diabetes mellitus with ketoacidosis without coma: Secondary | ICD-10-CM | POA: Diagnosis present

## 2023-07-25 LAB — BASIC METABOLIC PANEL WITH GFR
Anion gap: 8 (ref 5–15)
BUN: 13 mg/dL (ref 6–20)
CO2: 25 mmol/L (ref 22–32)
Calcium: 9.2 mg/dL (ref 8.9–10.3)
Chloride: 102 mmol/L (ref 98–111)
Creatinine, Ser: 0.85 mg/dL (ref 0.61–1.24)
GFR, Estimated: >60 mL/min (ref >60)
Glucose, Bld: 235 mg/dL — ABNORMAL HIGH (ref 70–99)
Potassium: 3.4 mmol/L — ABNORMAL LOW (ref 3.5–5.1)
Sodium: 135 mmol/L (ref 135–145)

## 2023-07-25 LAB — GLUCOSE, CAPILLARY
Glucose-Capillary: 261 mg/dL — ABNORMAL HIGH (ref 70–99)
Glucose-Capillary: 331 mg/dL — ABNORMAL HIGH (ref 70–99)

## 2023-07-25 LAB — PHOSPHORUS: Phosphorus: 2.1 mg/dL — ABNORMAL LOW (ref 2.5–4.6)

## 2023-07-25 LAB — MAGNESIUM: Magnesium: 2 mg/dL (ref 1.7–2.4)

## 2023-07-25 LAB — HEMOGLOBIN A1C
Hgb A1c MFr Bld: 12.7 % — ABNORMAL HIGH (ref 4.8–5.6)
Mean Plasma Glucose: 317.79 mg/dL

## 2023-07-25 MED ORDER — POTASSIUM CHLORIDE 20 MEQ PO PACK
40.0000 meq | PACK | Freq: Once | ORAL | Status: AC
Start: 2023-07-25 — End: 2023-07-25
  Administered 2023-07-25: 40 meq via ORAL
  Filled 2023-07-25: qty 2

## 2023-07-25 MED ORDER — INSULIN LISPRO (1 UNIT DIAL) 100 UNIT/ML (KWIKPEN)
6.0000 [IU] | PEN_INJECTOR | Freq: Three times a day (TID) | SUBCUTANEOUS | 11 refills | Status: DC
  Filled 2023-07-25: qty 15, 23d supply, fill #0
  Filled 2023-08-16: qty 15, 23d supply, fill #1
  Filled 2023-09-04: qty 15, 23d supply, fill #2
  Filled 2023-09-18 – 2023-10-09 (×4): qty 15, 23d supply, fill #3
  Filled 2023-11-05: qty 15, 23d supply, fill #4

## 2023-07-25 MED ORDER — INSULIN ASPART 100 UNIT/ML IJ SOLN: 0.0000 [IU] | Freq: Every day | INTRAMUSCULAR | Status: DC

## 2023-07-25 MED ORDER — CARVEDILOL 25 MG PO TABS
25.0000 mg | ORAL_TABLET | Freq: Two times a day (BID) | ORAL | 2 refills | Status: DC
  Filled 2023-07-25: qty 60, 30d supply, fill #0
  Filled 2023-08-16: qty 60, 30d supply, fill #1
  Filled 2023-10-09: qty 60, 30d supply, fill #2

## 2023-07-25 MED ORDER — VITAMIN B-1 100 MG PO TABS
100.0000 mg | ORAL_TABLET | Freq: Every day | ORAL | 2 refills | Status: DC
  Filled 2023-07-25: qty 30, 30d supply, fill #0
  Filled 2023-09-04: qty 30, 30d supply, fill #1
  Filled 2023-09-18: qty 30, 30d supply, fill #2

## 2023-07-25 MED ORDER — INSULIN ASPART 100 UNIT/ML IJ SOLN: 6.0000 [IU] | Freq: Three times a day (TID) | INTRAMUSCULAR | Status: DC

## 2023-07-25 MED ORDER — BASAGLAR KWIKPEN 100 UNIT/ML ~~LOC~~ SOPN
75.0000 [IU] | PEN_INJECTOR | SUBCUTANEOUS | 11 refills | Status: DC
  Filled 2023-07-25: qty 15, 20d supply, fill #0
  Filled 2023-08-16: qty 15, 20d supply, fill #1
  Filled 2023-09-04 (×2): qty 15, 20d supply, fill #2
  Filled 2023-09-18: qty 15, 20d supply, fill #3
  Filled 2023-10-09: qty 15, 20d supply, fill #4

## 2023-07-25 MED ORDER — ROSUVASTATIN CALCIUM 40 MG PO TABS
40.0000 mg | ORAL_TABLET | Freq: Every evening | ORAL | 2 refills | Status: AC
  Filled 2023-07-25: qty 30, 30d supply, fill #0
  Filled 2023-10-09: qty 30, 30d supply, fill #1

## 2023-07-25 MED ORDER — GABAPENTIN 400 MG PO CAPS
400.0000 mg | ORAL_CAPSULE | Freq: Three times a day (TID) | ORAL | 2 refills | Status: DC
  Filled 2023-07-25: qty 90, 30d supply, fill #0
  Filled 2023-08-16: qty 90, 30d supply, fill #1
  Filled 2023-09-18: qty 90, 30d supply, fill #2

## 2023-07-25 MED ORDER — AMLODIPINE BESYLATE 10 MG PO TABS
10.0000 mg | ORAL_TABLET | Freq: Every day | ORAL | 2 refills | Status: DC
  Filled 2023-07-25: qty 30, 30d supply, fill #0
  Filled 2023-08-16: qty 30, 30d supply, fill #1
  Filled 2023-09-18: qty 30, 30d supply, fill #2

## 2023-07-25 MED ORDER — SODIUM BICARBONATE 650 MG PO TABS
1300.0000 mg | ORAL_TABLET | Freq: Two times a day (BID) | ORAL | 0 refills | Status: DC
  Filled 2023-07-25 (×2): qty 60, 15d supply, fill #0

## 2023-07-25 MED ORDER — INSULIN GLARGINE-YFGN 100 UNIT/ML ~~LOC~~ SOLN: 75.0000 [IU] | SUBCUTANEOUS | Status: DC

## 2023-07-25 MED ORDER — FENOFIBRATE 160 MG PO TABS
160.0000 mg | ORAL_TABLET | Freq: Every morning | ORAL | 2 refills | Status: DC
  Filled 2023-07-25: qty 30, 30d supply, fill #0
  Filled 2023-08-16: qty 30, 30d supply, fill #1
  Filled 2023-09-18: qty 30, 30d supply, fill #2

## 2023-07-25 MED ORDER — INSUPEN PEN NEEDLES 31G X 5 MM MISC
0 refills | Status: DC
  Filled 2023-07-25: qty 100, 25d supply, fill #0

## 2023-07-25 MED ORDER — INSULIN ASPART 100 UNIT/ML IJ SOLN: 0.0000 [IU] | Freq: Three times a day (TID) | INTRAMUSCULAR | Status: DC

## 2023-07-25 MED ORDER — FOLIC ACID 1 MG PO TABS
1.0000 mg | ORAL_TABLET | Freq: Every day | ORAL | 0 refills | Status: DC
  Filled 2023-07-25: qty 30, 30d supply, fill #0

## 2023-07-25 MED ORDER — INSULIN ASPART 100 UNIT/ML IJ SOLN
6.0000 [IU] | Freq: Three times a day (TID) | INTRAMUSCULAR | 11 refills | Status: DC
  Filled 2023-07-25: qty 10, 56d supply, fill #0

## 2023-07-25 NOTE — TOC Progression Note (Signed)
 Transition of Care [facility]) - Progression Note    Patient Details  Name: Randy Deleon MRN: [id number] Date of Birth: January 20, 1966  Transition of Care [facility]) CM/SW Contact  Crayton Docker, RN Phone Number: 07/25/2023, 12:49 PM  Clinical Narrative:     CM to patient's room regarding consult for uninsured. CM provided application to [facility] physician follow up. Patient states has private transportation arranged. Per patient will complete application.    Expected Discharge Plan: Home/Self Care Barriers to Discharge: Inadequate or no insurance  Expected Discharge Plan and Services   Discharge Planning Services: CM Consult     Expected Discharge Date: 07/25/23                Social Determinants of Health (SDOH) Interventions SDOH Screenings   Food Insecurity: No Food Insecurity (07/23/2023)  Housing: Low Risk  (07/23/2023)  Transportation Needs: No Transportation Needs (07/23/2023)  Utilities: Not At Risk (07/23/2023)  Tobacco Use: High Risk (07/23/2023)    Readmission Risk Interventions     No data to display

## 2023-07-25 NOTE — TOC Transition Note (Signed)
 Transition of Care [facility]) - Discharge Note   Patient Details  Name: Randy Deleon MRN: [id number] Date of Birth: Dec 24, 1965  Transition of Care [facility]) CM/SW Contact:  Crayton Docker, RN 07/25/2023, 2:49 PM   Clinical Narrative:     Discharge orders noted for home/self care. Private transportation arranged.   Final next level of care: Home/Self Care Barriers to Discharge: No Barriers Identified   Patient Goals and CMS Choice    Home/self care  Discharge Placement     Home/self care           Discharge Plan and Services Additional resources added to the After Visit Summary for     Discharge Planning Services: CM Consult      Social Drivers of Health (SDOH) Interventions SDOH Screenings   Food Insecurity: No Food Insecurity (07/23/2023)  Housing: Low Risk  (07/23/2023)  Transportation Needs: No Transportation Needs (07/23/2023)  Utilities: Not At Risk (07/23/2023)  Tobacco Use: High Risk (07/23/2023)     Readmission Risk Interventions     No data to display

## 2023-07-25 NOTE — Inpatient Diabetes Management (Signed)
 Inpatient Diabetes Program Recommendations  AACE/ADA: New Consensus Statement on Inpatient Glycemic Control (2015)  Target Ranges:  Prepandial:   less than 140 mg/dL      Peak postprandial:   less than 180 mg/dL (1-2 hours)      Critically ill patients:  140 - 180 mg/dL    Latest Reference Range & Units 07/24/23 07:04 07/24/23 08:12 07/24/23 10:14 07/24/23 11:46 07/24/23 16:33 07/24/23 19:53 07/24/23 22:37  Glucose-Capillary 70 - 99 mg/dL 829 (H)  IV Insulin  Drip Running 147 (H)  60 units Semglee  at 0939 133 (H) 197 (H)  IV Insulin  Drip Stopped  6 units Novolog   194 (H)  6 units Novolog   224 (H) 270 (H)  3 units Novolog    (H): Data is abnormally high  Latest Reference Range & Units 07/25/23 07:54  Glucose-Capillary 70 - 99 mg/dL 562 (H)  9 units Novolog   60 units Semglee   (H): Data is abnormally high    Home DM Meds: Toujeo  100 units daily (taking less due to rationing), Humalog 0-45 units TID with meals (not taking recently), Actos  45 mg daily(not taking), Metformin  1000 mg QAM, Rybelsus  14 mg daily (not taking), Farxiga 10 mg daily (not taking)   Current Orders: Semglee  60 units Q24H     Novolog  Sensitive Correction Scale/ SSI (0-9 units) TID AC + HS     Novolog  4 units TID with meals   MD- Note CBG 261 this AM  Please consider increasing the Semglee  to 65 units BID    Discharge Recommendations: Long acting recommendations: Insulin  Glargine (BASAGLAR ) Kwikpen Dose to be determined  Short acting recommendations:  Meal + Correction coverage Insulin  lispro (HUMALOG) KwikPen  Sensitive Scale.  Dose to be determined Supply/Referral recommendations: Glucometer Test strips Lancet device Lancets Pen needles - standard   Use Adult Diabetes Insulin  Treatment Post Discharge order set.    --Will follow patient during hospitalization--  Langston Pippins RN, MSN, CDCES Diabetes Coordinator Inpatient Glycemic Control Team Team Pager: [phone]  (8a-5p)

## 2023-07-25 NOTE — Plan of Care (Signed)

## 2023-07-25 NOTE — Discharge Summary (Signed)
 Physician Discharge Summary   Patient: Randy Deleon MRN: [id number] DOB: [birth year]  Admit date:     07/23/2023  Discharge date: 07/25/23  Discharge Physician: Ezzard Holms   PCP: Shari Daughters, MD   Recommendations at discharge:  Follow-up with PCP  Discharge Diagnoses: Uncontrolled type 1 diabetes with diabetic ketoacidosis. Acute kidney injury secondary to DKA. Normal anion gap metabolic acidosis. Tobacco abuse Alcohol abuse. Class I obesity with BMI 31.01.   Hospital Course: Randy Deleon is a 58 y.o. male with medical history significant of  type 1 DM, HTN, HLD, tobacco abuse, alcohol use, alcoholic pancreatitis, who presents with polyuria, polydipsia, generalized weakness, nausea, vomiting.  Patient was on Lantus  100 units daily, lost insurance a month ago, he has reduced his insulin  dose to 15 units daily for 1 month. Upon arriving the hospital, he was found to have DKA with glucose 513, anion gap 30.  He was treated with insulin  drip in ICU. Anion gap closed 5/6.  Patient was subsequently transitioned to subcutaneous insulin .  General condition have improved and therefore patient being discharged today with antidiabetic medications and patient will follow-up with PCP For further management  Consultants: Diabetic coordinator Procedures performed: None Disposition: Home Diet recommendation:  Cardiac and Carb modified diet DISCHARGE MEDICATION: Allergies as of 07/25/2023       Reactions   Contrast Media [iodinated Contrast Media]    Renal failure   Other Rash, Other (See Comments)   Tested allergic to LIMA BEANS   Shellfish Allergy Rash, Other (See Comments)   TESTED ALLERGIC TO "SHELLFISH"        Medication List     STOP taking these medications    amLODipine -benazepril  10-20 MG capsule Commonly known as: LOTREL   dapagliflozin propanediol 10 MG Tabs tablet Commonly known as: FARXIGA   metFORMIN  1000 MG tablet Commonly known as: GLUCOPHAGE     pioglitazone  45 MG tablet Commonly known as: ACTOS    Rybelsus  14 MG Tabs Generic drug: Semaglutide    Toujeo  Max SoloStar 300 UNIT/ML Solostar Pen Generic drug: insulin  glargine (2 Unit Dial) Replaced by: Basaglar  KwikPen 100 UNIT/ML   vardenafil  20 MG tablet Commonly known as: LEVITRA        TAKE these medications    acetaminophen  325 MG tablet Commonly known as: TYLENOL  Take 2 tablets (650 mg total) by mouth every 6 (six) hours as needed for mild pain or fever.   amLODipine  10 MG tablet Commonly known as: NORVASC  Take 1 tablet (10 mg total) by mouth daily. Start taking on: Jul 26, 2023   Basaglar  KwikPen 100 UNIT/ML Inject 75 Units into the skin daily. Start taking on: Jul 26, 2023 Replaces: Toujeo  Max SoloStar 300 UNIT/ML Solostar Pen   carvedilol  25 MG tablet Commonly known as: Coreg  Take 1 tablet (25 mg total) by mouth 2 (two) times daily.   Cholecalciferol  125 MCG (5000 UT) capsule Take 5,000 Units by mouth daily.   fenofibrate  160 MG tablet Take 1 tablet (160 mg total) by mouth every morning.   folic acid  1 MG tablet Commonly known as: FOLVITE  Take 1 tablet (1 mg total) by mouth daily. Start taking on: Jul 26, 2023   FreeStyle Libre 2 Sensor Misc Place 1 Units onto the skin See admin instructions. Every two weeks   gabapentin  400 MG capsule Commonly known as: Neurontin  Take 1 capsule (400 mg total) by mouth 3 (three) times daily.   insulin  aspart 100 UNIT/ML injection Commonly known as: novoLOG  Inject 6 Units into the  skin 3 (three) times daily with meals.   insulin  lispro 100 UNIT/ML KwikPen Commonly known as: HumaLOG KwikPen Inject 6 Units into the skin 3 (three) times daily with each meal plus add 0-15 units 3 (three) times daily per sliding scale as directed. What changed:  how much to take when to take this   rosuvastatin  40 MG tablet Commonly known as: CRESTOR  Take 1 tablet (40 mg total) by mouth every evening.   sildenafil 100 MG  tablet Commonly known as: VIAGRA Take 100 mg by mouth daily as needed.   sodium bicarbonate  650 MG tablet Take 2 tablets (1,300 mg total) by mouth 2 (two) times daily.   thiamine  100 MG tablet Commonly known as: Vitamin B-1 Take 1 tablet (100 mg total) by mouth daily. Start taking on: Jul 26, 2023   vitamin B-12 500 MCG tablet Commonly known as: CYANOCOBALAMIN Take 500 mcg by mouth daily.        Follow-up Information     Shari Daughters, MD Follow up.   Specialty: Internal Medicine Why: Hospital follow up Contact information: [address] [place] [address] [phone]                Discharge Exam: Randy Deleon Weights   07/23/23 1809  Weight: 95.3 kg   General exam: Appears calm and comfortable  Respiratory system: Clear to auscultation. Respiratory effort normal. Cardiovascular system: S1 & S2 heard, RRR. No JVD, murmurs, rubs, gallops or clicks. No pedal edema. Gastrointestinal system: Abdomen is nondistended, soft and nontender. No organomegaly or masses felt. Normal bowel sounds heard. Central nervous system: Alert and oriented. No focal neurological deficits. Extremities: Symmetric 5 x 5 power. Skin: No rashes, lesions or ulcers Psychiatry: Judgement and insight appear normal. Mood & affect appropriate.   Condition at discharge: good  The results of significant diagnostics from this hospitalization (including imaging, microbiology, ancillary and laboratory) are listed below for reference.   Imaging Studies: No results found.  Microbiology: Results for orders placed or performed during the hospital encounter of 07/23/23  MRSA Next Gen by PCR, Nasal     Status: None   Collection Time: 07/23/23 11:32 PM   Specimen: Nasal Mucosa; Nasal Swab  Result Value Ref Range Status   MRSA by PCR Next Gen NOT DETECTED NOT DETECTED Final    Comment: (NOTE) The GeneXpert MRSA Assay (FDA approved for NASAL specimens only), is one component of a comprehensive  MRSA colonization surveillance program. It is not intended to diagnose MRSA infection nor to guide or monitor treatment for MRSA infections. Test performance is not FDA approved in patients less than 56 years old. Performed at [facility], [address] [place], [place] [address]     Labs: CBC: Recent Labs  Lab 07/23/23 1806  WBC 8.1  HGB 15.5  HCT 47.4  MCV 101.7*  PLT 160   Basic Metabolic Panel: Recent Labs  Lab 07/23/23 1806 07/23/23 2221 07/24/23 0127 07/24/23 0436 07/25/23 0544  NA 132* 134* 137 137 135  K 5.1 4.3 3.5 3.6 3.4*  CL 93* 99 106 107 102  CO2 9* 9* 13* 19* 25  GLUCOSE 513* 432* 251* 193* 235*  BUN 20 23* 18 17 13   CREATININE 1.43* 1.41* 1.08 0.87 0.85  CALCIUM  10.0 9.2 8.9 9.1 9.2  MG  --  1.8  --   --  2.0  PHOS  --  3.6  --   --  2.1*   Liver Function Tests: Recent Labs  Lab 07/23/23 1806  AST 11*  ALT 11  ALKPHOS 59  BILITOT 2.5*  PROT 8.2*  ALBUMIN 4.6   CBG: Recent Labs  Lab 07/24/23 1633 07/24/23 1953 07/24/23 2237 07/25/23 0754 07/25/23 1205  GLUCAP 194* 224* 270* 261* 331*    Discharge time spent:  39 minutes.  Signed: Ezzard Holms, MD [facility] 07/25/2023

## 2023-08-14 ENCOUNTER — Other Ambulatory Visit

## 2023-08-14 DIAGNOSIS — E1142 Type 2 diabetes mellitus with diabetic polyneuropathy: Secondary | ICD-10-CM

## 2023-08-15 ENCOUNTER — Encounter: Payer: Self-pay | Admitting: Internal Medicine

## 2023-08-15 ENCOUNTER — Ambulatory Visit: Admitting: Internal Medicine

## 2023-08-15 VITALS — BP 126/72 | HR 81 | Temp 97.5°F | Ht 69.0 in | Wt 234.8 lb

## 2023-08-15 DIAGNOSIS — Z794 Long term (current) use of insulin: Secondary | ICD-10-CM | POA: Diagnosis not present

## 2023-08-15 DIAGNOSIS — E1142 Type 2 diabetes mellitus with diabetic polyneuropathy: Secondary | ICD-10-CM | POA: Diagnosis not present

## 2023-08-15 DIAGNOSIS — Z0130 Encounter for examination of blood pressure without abnormal findings: Secondary | ICD-10-CM

## 2023-08-15 DIAGNOSIS — E1111 Type 2 diabetes mellitus with ketoacidosis with coma: Secondary | ICD-10-CM

## 2023-08-15 DIAGNOSIS — E119 Type 2 diabetes mellitus without complications: Secondary | ICD-10-CM

## 2023-08-15 LAB — FRUCTOSAMINE: Fructosamine: 387 umol/L — ABNORMAL HIGH (ref 0–285)

## 2023-08-15 LAB — POCT CBG (FASTING - GLUCOSE)-MANUAL ENTRY: Glucose Fasting, POC: 240 mg/dL — AB (ref 70–99)

## 2023-08-15 MED ORDER — DAPAGLIFLOZIN PROPANEDIOL 10 MG PO TABS
10.0000 mg | ORAL_TABLET | Freq: Every day | ORAL | 0 refills | Status: DC
Start: 2023-08-15 — End: 2023-10-24

## 2023-08-15 MED ORDER — METFORMIN HCL 1000 MG PO TABS: 1000.0000 mg | ORAL_TABLET | Freq: Two times a day (BID) | ORAL | 0 refills | Status: DC

## 2023-08-15 NOTE — Progress Notes (Signed)
 Established Patient Office Visit  Subjective:  Patient ID: Randy Deleon, male    DOB: November 13, 1965  Age: 58 y.o. MRN: [id number]  Chief Complaint  Patient presents with   Hospitalization Follow-up    Hospital f/u for DKA. Home bg still very high on review of Freestyle Libre monitor.    No other concerns at this time.   Past Medical History:  Diagnosis Date   Diabetes mellitus without complication (HCC)    Hypertension     Past Surgical History:  Procedure Laterality Date   COLONOSCOPY WITH PROPOFOL  N/A 04/16/2021   Procedure: COLONOSCOPY WITH PROPOFOL ;  Surgeon: Selena Daily, MD;  Location: [facility] ENDOSCOPY;  Service: Gastroenterology;  Laterality: N/A;   none     TONSILLECTOMY      Social History   Socioeconomic History   Marital status: Single    Spouse name: Not on file   Number of children: Not on file   Years of education: Not on file   Highest education level: Not on file  Occupational History   Not on file  Tobacco Use   Smoking status: Every Day    Current packs/day: 0.50    Average packs/day: 0.5 packs/day for 26.5 years (13.3 ttl pk-yrs)    Types: Cigarettes    Start date: 01/29/1997   Smokeless tobacco: Never   Tobacco comments:    Refer to [facility]. [facility]  Vaping Use   Vaping status: Never Used  Substance and Sexual Activity   Alcohol use: Not Currently    Comment: occ   Drug use: Not Currently   Sexual activity: Not on file  Other Topics Concern   Not on file  Social History Narrative   Not on file   Social Drivers of Health   Financial Resource Strain: Not on file  Food Insecurity: No Food Insecurity (07/23/2023)   Hunger Vital Sign    Worried About Running Out of Food in the Last Year: Never true    Ran Out of Food in the Last Year: Never true  Transportation Needs: No Transportation Needs (07/23/2023)   PRAPARE - Administrator, Civil Service (Medical): No    Lack of Transportation (Non-Medical): No  Physical Activity:  Not on file  Stress: Not on file  Social Connections: Not on file  Intimate Partner Violence: Not At Risk (07/23/2023)   Humiliation, Afraid, Rape, and Kick questionnaire    Fear of Current or Ex-Partner: No    Emotionally Abused: No    Physically Abused: No    Sexually Abused: No    Family History  Problem Relation Age of Onset   Diabetes Mother    Hypertension Mother    Congestive Heart Failure Father    Diabetes Father    Hypertension Father     Allergies  Allergen Reactions   Contrast Media [Iodinated Contrast Media]     Renal failure   Other Rash and Other (See Comments)    Tested allergic to LIMA BEANS   Shellfish Allergy Rash and Other (See Comments)    TESTED ALLERGIC TO "SHELLFISH"    Outpatient Medications Prior to Visit  Medication Sig   acetaminophen  (TYLENOL ) 325 MG tablet Take 2 tablets (650 mg total) by mouth every 6 (six) hours as needed for mild pain or fever.   amLODipine  (NORVASC ) 10 MG tablet Take 1 tablet (10 mg total) by mouth daily.   carvedilol  (COREG ) 25 MG tablet Take 1 tablet (25 mg total) by mouth 2 (two)  times daily.   Continuous Blood Gluc Sensor (FREESTYLE LIBRE 2 SENSOR) MISC Place 1 Units onto the skin See admin instructions. Every two weeks   fenofibrate  160 MG tablet Take 1 tablet (160 mg total) by mouth every morning.   folic acid  (FOLVITE ) 1 MG tablet Take 1 tablet (1 mg total) by mouth daily.   gabapentin  (NEURONTIN ) 400 MG capsule Take 1 capsule (400 mg total) by mouth 3 (three) times daily.   Insulin  Glargine (BASAGLAR  KWIKPEN) 100 UNIT/ML Inject 75 Units into the skin daily.   insulin  lispro (HUMALOG  KWIKPEN) 100 UNIT/ML KwikPen Inject 6 Units into the skin 3 (three) times daily with each meal plus add 0-15 units 3 (three) times daily per sliding scale as directed.   Insulin  Pen Needle (INSUPEN PEN NEEDLES) 31G X 5 MM MISC Use as directed with insulin    rosuvastatin  (CRESTOR ) 40 MG tablet Take 1 tablet (40 mg total) by mouth every  evening.   sildenafil (VIAGRA) 100 MG tablet Take 100 mg by mouth daily as needed.   thiamine  (VITAMIN B-1) 100 MG tablet Take 1 tablet (100 mg total) by mouth daily.   vitamin B-12 (CYANOCOBALAMIN) 500 MCG tablet Take 500 mcg by mouth daily.   [DISCONTINUED] sodium bicarbonate  650 MG tablet Take 2 tablets (1,300 mg total) by mouth 2 (two) times daily.   Cholecalciferol  125 MCG (5000 UT) capsule Take 5,000 Units by mouth daily. (Patient not taking: Reported on 08/18/2022)   No facility-administered medications prior to visit.    Review of Systems  Constitutional: Negative.   HENT: Negative.    Eyes: Negative.   Respiratory: Negative.    Cardiovascular: Negative.   Gastrointestinal: Negative.   Genitourinary: Negative.        ED  Skin: Negative.   Neurological:  Positive for sensory change.       Numbness and tingling in hands and feet  Endo/Heme/Allergies: Negative.        Objective:   BP 126/72   Pulse 81   Temp (!) 97.5 F (36.4 C) (Tympanic)   Ht 5\' 9"  (1.753 m)   Wt 234 lb 12.8 oz (106.5 kg)   SpO2 98%   BMI 34.67 kg/m   Vitals:   08/15/23 0846  BP: 126/72  Pulse: 81  Temp: (!) 97.5 F (36.4 C)  Height: 5\' 9"  (1.753 m)  Weight: 234 lb 12.8 oz (106.5 kg)  SpO2: 98%  TempSrc: Tympanic  BMI (Calculated): 34.66    Physical Exam Vitals reviewed.  Constitutional:      Appearance: Normal appearance. He is obese.  HENT:     Head: Normocephalic.     Left Ear: There is no impacted cerumen.     Nose: Nose normal.     Mouth/Throat:     Mouth: Mucous membranes are moist.     Pharynx: No posterior oropharyngeal erythema.  Eyes:     Extraocular Movements: Extraocular movements intact.     Pupils: Pupils are equal, round, and reactive to light.  Cardiovascular:     Rate and Rhythm: Regular rhythm.     Chest Wall: PMI is not displaced.     Pulses: Normal pulses.     Heart sounds: Normal heart sounds. No murmur heard. Pulmonary:     Effort: Pulmonary effort is  normal.     Breath sounds: Normal air entry. No rhonchi or rales.  Abdominal:     General: Abdomen is flat. Bowel sounds are normal. There is no distension.  Palpations: Abdomen is soft. There is no hepatomegaly, splenomegaly or mass.     Tenderness: There is no abdominal tenderness.  Musculoskeletal:        General: Normal range of motion.     Cervical back: Normal range of motion and neck supple.     Right lower leg: No edema.     Left lower leg: No edema.  Skin:    General: Skin is warm and dry.  Neurological:     General: No focal deficit present.     Mental Status: He is alert and oriented to person, place, and time.     Cranial Nerves: No cranial nerve deficit.     Motor: No weakness.  Psychiatric:        Mood and Affect: Mood normal.        Behavior: Behavior normal.      Results for orders placed or performed in visit on 08/15/23  POCT CBG (Fasting - Glucose)  Result Value Ref Range   Glucose Fasting, POC 240 (A) 70 - 99 mg/dL    Recent Results (from the past 2160 hours)  Lipase, blood     Status: Abnormal   Collection Time: 07/23/23  6:06 PM  Result Value Ref Range   Lipase 137 (H) 11 - 51 U/L    Comment: Performed at [facility], [address] [place], [place] [address]  Comprehensive metabolic panel     Status: Abnormal   Collection Time: 07/23/23  6:06 PM  Result Value Ref Range   Sodium 132 (L) 135 - 145 mmol/L    Comment: ELECTROLYTES REPEATED TO VERIFY SKL   Potassium 5.1 3.5 - 5.1 mmol/L   Chloride 93 (L) 98 - 111 mmol/L   CO2 9 (L) 22 - 32 mmol/L   Glucose, Bld 513 (HH) 70 - 99 mg/dL    Comment: CRITICAL RESULT CALLED TO, READ BACK BY AND VERIFIED WITH LISA THOMPSON @1915  ON 07/23/23 SKL Glucose reference range applies only to samples taken after fasting for at least 8 hours.    BUN 20 6 - 20 mg/dL   Creatinine, Ser 1.91 (H) 0.61 - 1.24 mg/dL   Calcium  10.0 8.9 - 10.3 mg/dL   Total Protein 8.2 (H) 6.5 - 8.1 g/dL   Albumin 4.6 3.5 -  5.0 g/dL   AST 11 (L) 15 - 41 U/L   ALT 11 0 - 44 U/L   Alkaline Phosphatase 59 38 - 126 U/L   Total Bilirubin 2.5 (H) 0.0 - 1.2 mg/dL   GFR, Estimated 58 (L) >60 mL/min    Comment: (NOTE) Calculated using the CKD-EPI Creatinine Equation (2021)    Anion gap 30 (H) 5 - 15    Comment: Performed at [facility], [address] [place], [place] [address]  CBC     Status: Abnormal   Collection Time: 07/23/23  6:06 PM  Result Value Ref Range   WBC 8.1 4.0 - 10.5 K/uL   RBC 4.66 4.22 - 5.81 MIL/uL   Hemoglobin 15.5 13.0 - 17.0 g/dL   HCT 56.2 13.0 - 86.5 %   MCV 101.7 (H) 80.0 - 100.0 fL   MCH 33.3 26.0 - 34.0 pg   MCHC 32.7 30.0 - 36.0 g/dL   RDW 78.4 69.6 - 29.5 %   Platelets 160 150 - 400 K/uL   nRBC 0.0 0.0 - 0.2 %    Comment: Performed at [facility], [address] [place], [place] [address]  Urinalysis, Routine  w reflex microscopic -Urine, Clean Catch     Status: Abnormal   Collection Time: 07/23/23  6:06 PM  Result Value Ref Range   Color, Urine YELLOW (A) YELLOW   APPearance CLEAR (A) CLEAR   Specific Gravity, Urine 1.027 1.005 - 1.030   pH 5.0 5.0 - 8.0   Glucose, UA >=500 (A) NEGATIVE mg/dL   Hgb urine dipstick MODERATE (A) NEGATIVE   Bilirubin Urine NEGATIVE NEGATIVE   Ketones, ur 80 (A) NEGATIVE mg/dL   Protein, ur 409 (A) NEGATIVE mg/dL   Nitrite NEGATIVE NEGATIVE   Leukocytes,Ua NEGATIVE NEGATIVE   RBC / HPF 0-5 0 - 5 RBC/hpf   WBC, UA 0-5 0 - 5 WBC/hpf   Bacteria, UA RARE (A) NONE SEEN   Squamous Epithelial / HPF 0-5 0 - 5 /HPF   Mucus PRESENT    Hyaline Casts, UA PRESENT     Comment: Performed at [facility], [address] [place], [place] [address]  Beta-hydroxybutyric acid     Status: Abnormal   Collection Time: 07/23/23  9:11 PM  Result Value Ref Range   Beta-Hydroxybutyric Acid >8.00 (H) 0.05 - 0.27 mmol/L    Comment: RESULT CONFIRMED BY MANUAL DILUTION SKL Performed at [facility], [address]  [place], [place] [address]   CBG monitoring, ED     Status: Abnormal   Collection Time: 07/23/23  9:32 PM  Result Value Ref Range   Glucose-Capillary 525 (HH) 70 - 99 mg/dL    Comment: Glucose reference range applies only to samples taken after fasting for at least 8 hours.   Comment 1 Notify RN    Comment 2 Document in Chart   Magnesium      Status: None   Collection Time: 07/23/23 10:21 PM  Result Value Ref Range   Magnesium  1.8 1.7 - 2.4 mg/dL    Comment: Performed at [facility], [address] [place], [place] [address]  Phosphorus     Status: None   Collection Time: 07/23/23 10:21 PM  Result Value Ref Range   Phosphorus 3.6 2.5 - 4.6 mg/dL    Comment: Performed at [facility], [address] [place], [place] [address]  HIV Antibody (routine testing w rflx)     Status: None   Collection Time: 07/23/23 10:21 PM  Result Value Ref Range   HIV Screen 4th Generation wRfx Non Reactive Non Reactive    Comment: Performed at [facility], [address] [place], [place] [address]  Basic metabolic panel     Status: Abnormal   Collection Time: 07/23/23 10:21 PM  Result Value Ref Range   Sodium 134 (L) 135 - 145 mmol/L    Comment: ELECTROLYTES REPEATED TO VERIFY skl   Potassium 4.3 3.5 - 5.1 mmol/L   Chloride 99 98 - 111 mmol/L   CO2 9 (L) 22 - 32 mmol/L   Glucose, Bld 432 (H) 70 - 99 mg/dL    Comment: Glucose reference range applies only to samples taken after fasting for at least 8 hours.   BUN 23 (H) 6 - 20 mg/dL   Creatinine, Ser 2.95 (H) 0.61 - 1.24 mg/dL   Calcium  9.2 8.9 - 10.3 mg/dL   GFR, Estimated 59 (L) >60 mL/min    Comment: (NOTE) Calculated using the CKD-EPI Creatinine Equation (2021)    Anion gap 26 (H) 5 - 15    Comment: Performed at [facility], [address] [place], [place] [address]  CBG  monitoring, ED     Status: Abnormal   Collection Time: 07/23/23 10:50 PM  Result Value Ref Range   Glucose-Capillary 370 (H) 70 - 99  mg/dL    Comment: Glucose reference range applies only to samples taken after fasting for at least 8 hours.  Glucose, capillary     Status: Abnormal   Collection Time: 07/23/23 11:27 PM  Result Value Ref Range   Glucose-Capillary 315 (H) 70 - 99 mg/dL    Comment: Glucose reference range applies only to samples taken after fasting for at least 8 hours.  MRSA Next Gen by PCR, Nasal     Status: None   Collection Time: 07/23/23 11:32 PM   Specimen: Nasal Mucosa; Nasal Swab  Result Value Ref Range   MRSA by PCR Next Gen NOT DETECTED NOT DETECTED    Comment: (NOTE) The GeneXpert MRSA Assay (FDA approved for NASAL specimens only), is one component of a comprehensive MRSA colonization surveillance program. It is not intended to diagnose MRSA infection nor to guide or monitor treatment for MRSA infections. Test performance is not FDA approved in patients less than 80 years old. Performed at [facility], [address] [place], [place] [address]   Glucose, capillary     Status: Abnormal   Collection Time: 07/24/23 12:28 AM  Result Value Ref Range   Glucose-Capillary 241 (H) 70 - 99 mg/dL    Comment: Glucose reference range applies only to samples taken after fasting for at least 8 hours.  Basic metabolic panel     Status: Abnormal   Collection Time: 07/24/23  1:27 AM  Result Value Ref Range   Sodium 137 135 - 145 mmol/L   Potassium 3.5 3.5 - 5.1 mmol/L   Chloride 106 98 - 111 mmol/L   CO2 13 (L) 22 - 32 mmol/L   Glucose, Bld 251 (H) 70 - 99 mg/dL    Comment: Glucose reference range applies only to samples taken after fasting for at least 8 hours.   BUN 18 6 - 20 mg/dL   Creatinine, Ser 6.04 0.61 - 1.24 mg/dL   Calcium  8.9 8.9 - 10.3 mg/dL   GFR, Estimated >54 >09 mL/min    Comment: (NOTE) Calculated using the CKD-EPI Creatinine Equation (2021)    Anion gap 18 (H) 5 - 15    Comment: Performed at [facility], [address] [place], [place] [address]  Glucose,  capillary     Status: Abnormal   Collection Time: 07/24/23  1:51 AM  Result Value Ref Range   Glucose-Capillary 218 (H) 70 - 99 mg/dL    Comment: Glucose reference range applies only to samples taken after fasting for at least 8 hours.  Glucose, capillary     Status: Abnormal   Collection Time: 07/24/23  2:49 AM  Result Value Ref Range   Glucose-Capillary 218 (H) 70 - 99 mg/dL    Comment: Glucose reference range applies only to samples taken after fasting for at least 8 hours.  Glucose, capillary     Status: Abnormal   Collection Time: 07/24/23  3:56 AM  Result Value Ref Range   Glucose-Capillary 192 (H) 70 - 99 mg/dL    Comment: Glucose reference range applies only to samples taken after fasting for at least 8 hours.  Basic metabolic panel     Status: Abnormal   Collection Time: 07/24/23  4:36 AM  Result Value Ref Range   Sodium 137 135 - 145 mmol/L   Potassium 3.6 3.5 -  5.1 mmol/L   Chloride 107 98 - 111 mmol/L   CO2 19 (L) 22 - 32 mmol/L   Glucose, Bld 193 (H) 70 - 99 mg/dL    Comment: Glucose reference range applies only to samples taken after fasting for at least 8 hours.   BUN 17 6 - 20 mg/dL   Creatinine, Ser 1.61 0.61 - 1.24 mg/dL   Calcium  9.1 8.9 - 10.3 mg/dL   GFR, Estimated >09 >60 mL/min    Comment: (NOTE) Calculated using the CKD-EPI Creatinine Equation (2021)    Anion gap 11 5 - 15    Comment: Performed at [facility], [address] [place], [place] [address]  Glucose, capillary     Status: Abnormal   Collection Time: 07/24/23  5:03 AM  Result Value Ref Range   Glucose-Capillary 178 (H) 70 - 99 mg/dL    Comment: Glucose reference range applies only to samples taken after fasting for at least 8 hours.  Glucose, capillary     Status: Abnormal   Collection Time: 07/24/23  6:04 AM  Result Value Ref Range   Glucose-Capillary 153 (H) 70 - 99 mg/dL    Comment: Glucose reference range applies only to samples taken after fasting for at least 8 hours.   Glucose, capillary     Status: Abnormal   Collection Time: 07/24/23  7:04 AM  Result Value Ref Range   Glucose-Capillary 152 (H) 70 - 99 mg/dL    Comment: Glucose reference range applies only to samples taken after fasting for at least 8 hours.  Glucose, capillary     Status: Abnormal   Collection Time: 07/24/23  8:12 AM  Result Value Ref Range   Glucose-Capillary 147 (H) 70 - 99 mg/dL    Comment: Glucose reference range applies only to samples taken after fasting for at least 8 hours.  Glucose, capillary     Status: Abnormal   Collection Time: 07/24/23 10:14 AM  Result Value Ref Range   Glucose-Capillary 133 (H) 70 - 99 mg/dL    Comment: Glucose reference range applies only to samples taken after fasting for at least 8 hours.  Glucose, capillary     Status: Abnormal   Collection Time: 07/24/23 11:46 AM  Result Value Ref Range   Glucose-Capillary 197 (H) 70 - 99 mg/dL    Comment: Glucose reference range applies only to samples taken after fasting for at least 8 hours.  Glucose, capillary     Status: Abnormal   Collection Time: 07/24/23  4:33 PM  Result Value Ref Range   Glucose-Capillary 194 (H) 70 - 99 mg/dL    Comment: Glucose reference range applies only to samples taken after fasting for at least 8 hours.  Glucose, capillary     Status: Abnormal   Collection Time: 07/24/23  7:53 PM  Result Value Ref Range   Glucose-Capillary 224 (H) 70 - 99 mg/dL    Comment: Glucose reference range applies only to samples taken after fasting for at least 8 hours.  Glucose, capillary     Status: Abnormal   Collection Time: 07/24/23 10:37 PM  Result Value Ref Range   Glucose-Capillary 270 (H) 70 - 99 mg/dL    Comment: Glucose reference range applies only to samples taken after fasting for at least 8 hours.  Basic metabolic panel     Status: Abnormal   Collection Time: 07/25/23  5:44 AM  Result Value Ref Range   Sodium 135 135 - 145 mmol/L   Potassium 3.4 (  L) 3.5 - 5.1 mmol/L   Chloride 102  98 - 111 mmol/L   CO2 25 22 - 32 mmol/L   Glucose, Bld 235 (H) 70 - 99 mg/dL    Comment: Glucose reference range applies only to samples taken after fasting for at least 8 hours.   BUN 13 6 - 20 mg/dL   Creatinine, Ser 0.45 0.61 - 1.24 mg/dL   Calcium  9.2 8.9 - 10.3 mg/dL   GFR, Estimated >40 >98 mL/min    Comment: (NOTE) Calculated using the CKD-EPI Creatinine Equation (2021)    Anion gap 8 5 - 15    Comment: Performed at [facility], [address] [place], [place] [address]  Magnesium      Status: None   Collection Time: 07/25/23  5:44 AM  Result Value Ref Range   Magnesium  2.0 1.7 - 2.4 mg/dL    Comment: Performed at [facility], [address] [place], [place] [address]  Phosphorus     Status: Abnormal   Collection Time: 07/25/23  5:44 AM  Result Value Ref Range   Phosphorus 2.1 (L) 2.5 - 4.6 mg/dL    Comment: Performed at [facility], [address] [place], [place] [address]  Hemoglobin A1c     Status: Abnormal   Collection Time: 07/25/23  5:44 AM  Result Value Ref Range   Hgb A1c MFr Bld 12.7 (H) 4.8 - 5.6 %    Comment: (NOTE) Pre diabetes:          5.7%-6.4%  Diabetes:              >6.4%  Glycemic control for   <7.0% adults with diabetes    Mean Plasma Glucose 317.79 mg/dL    Comment: Performed at [facility], [address], [place], [place] [address]  Glucose, capillary     Status: Abnormal   Collection Time: 07/25/23  7:54 AM  Result Value Ref Range   Glucose-Capillary 261 (H) 70 - 99 mg/dL    Comment: Glucose reference range applies only to samples taken after fasting for at least 8 hours.  Glucose, capillary     Status: Abnormal   Collection Time: 07/25/23 12:05 PM  Result Value Ref Range   Glucose-Capillary 331 (H) 70 - 99 mg/dL    Comment: Glucose reference range applies only to samples taken after fasting for at least 8 hours.  Fructosamine     Status: Abnormal   Collection Time: 08/14/23  9:36 AM  Result  Value Ref Range   Fructosamine 387 (H) 0 - 285 umol/L    Comment: Published reference interval for apparently healthy subjects between age 17 and 61 is 56 - 285 umol/L and in a poorly controlled diabetic population is 228 - 563 umol/L with a mean of 396 umol/L.   POCT CBG (Fasting - Glucose)     Status: Abnormal   Collection Time: 08/15/23  8:54 AM  Result Value Ref Range   Glucose Fasting, POC 240 (A) 70 - 99 mg/dL      Assessment & Plan:   Problem List Items Addressed This Visit       Endocrine   DKA, type 2 (HCC)   Relevant Medications   metFORMIN  (GLUCOPHAGE ) 1000 MG tablet   dapagliflozin propanediol (FARXIGA) 10 MG TABS tablet   Other Relevant Orders   BMP8+Anion Gap   Diabetic polyneuropathy associated with type 2 diabetes mellitus (HCC)   Relevant Medications   metFORMIN  (GLUCOPHAGE ) 1000 MG tablet  dapagliflozin propanediol (FARXIGA) 10 MG TABS tablet   Other Visit Diagnoses       Type 2 diabetes mellitus without complication, with long-term current use of insulin  (HCC)    -  Primary   Relevant Medications   metFORMIN  (GLUCOPHAGE ) 1000 MG tablet   dapagliflozin propanediol (FARXIGA) 10 MG TABS tablet   Other Relevant Orders   POCT CBG (Fasting - Glucose) (Completed)       Return in about 2 weeks (around 08/29/2023) for fu with labs prior.   Total time spent: 30 minutes  Arzella Bitters, MD  08/15/2023   This document may have been prepared by [facility] Voice Recognition software and as such may include unintentional dictation errors.

## 2023-08-16 ENCOUNTER — Other Ambulatory Visit: Payer: Self-pay

## 2023-08-17 ENCOUNTER — Other Ambulatory Visit: Payer: Self-pay

## 2023-08-29 ENCOUNTER — Other Ambulatory Visit: Payer: Self-pay | Admitting: Internal Medicine

## 2023-08-29 ENCOUNTER — Other Ambulatory Visit: Payer: Self-pay

## 2023-08-29 ENCOUNTER — Telehealth: Payer: Self-pay | Admitting: Internal Medicine

## 2023-08-29 DIAGNOSIS — E1142 Type 2 diabetes mellitus with diabetic polyneuropathy: Secondary | ICD-10-CM

## 2023-08-29 MED ORDER — DEXCOM G7 RECEIVER DEVI: 1.0000 | 0 refills | Status: DC

## 2023-08-29 MED ORDER — DEXCOM G7 SENSOR MISC: 1.0000 | 11 refills | Status: DC

## 2023-08-29 NOTE — Telephone Encounter (Signed)
 Patient left VM requesting a refill for his Dexcom CGM. I don't see where he has been prescribed a Dexcom in the past. I see a Freestyle Libre from 2023. Can we send a new Rx for him for a Dexcom please?   [facility] - [address]

## 2023-09-03 ENCOUNTER — Ambulatory Visit: Admitting: Internal Medicine

## 2023-09-04 ENCOUNTER — Other Ambulatory Visit: Payer: Self-pay

## 2023-09-04 ENCOUNTER — Other Ambulatory Visit: Payer: Self-pay | Admitting: Internal Medicine

## 2023-09-04 MED FILL — Folic Acid Tab 1 MG: ORAL | 30 days supply | Qty: 30 | Fill #0 | Status: CN

## 2023-09-06 ENCOUNTER — Other Ambulatory Visit: Payer: Self-pay

## 2023-09-07 ENCOUNTER — Encounter: Payer: Self-pay | Admitting: Internal Medicine

## 2023-09-07 ENCOUNTER — Ambulatory Visit: Admitting: Internal Medicine

## 2023-09-07 VITALS — BP 130/70 | HR 74 | Temp 96.7°F | Ht 69.0 in | Wt 237.4 lb

## 2023-09-07 DIAGNOSIS — E1110 Type 2 diabetes mellitus with ketoacidosis without coma: Secondary | ICD-10-CM | POA: Diagnosis not present

## 2023-09-07 DIAGNOSIS — E1142 Type 2 diabetes mellitus with diabetic polyneuropathy: Secondary | ICD-10-CM

## 2023-09-07 DIAGNOSIS — Z0130 Encounter for examination of blood pressure without abnormal findings: Secondary | ICD-10-CM

## 2023-09-07 LAB — GLUCOSE, POCT (MANUAL RESULT ENTRY): POC Glucose: 251 mg/dL — AB (ref 70–99)

## 2023-09-07 MED ORDER — ONETOUCH DELICA PLUS LANCING MISC
1.0000 | Freq: Three times a day (TID) | 1 refills | Status: AC
Start: 2023-09-07 — End: 2024-03-05

## 2023-09-07 MED ORDER — ONETOUCH DELICA PLUS LANCING MISC: 1.0000 | 0 refills | Status: DC

## 2023-09-07 MED ORDER — ONETOUCH ULTRA 2 W/DEVICE KIT: 1.0000 | PACK | Freq: Once | 0 refills | Status: AC

## 2023-09-07 MED ORDER — ONETOUCH ULTRA TEST VI STRP: ORAL_STRIP | 12 refills | Status: DC

## 2023-09-07 NOTE — Progress Notes (Signed)
 Established Patient Office Visit  Subjective:  Patient ID: Randy Deleon, male    DOB: 05/03/1965  Age: 58 y.o. MRN: [id number]  Chief Complaint  Patient presents with   Follow-up    2 week follow up    No new complaints, here for lab review and medication refills. Pharmacy failed to fill Farxiga  and Dexcom no longer covered by his insurance.    No other concerns at this time.   Past Medical History:  Diagnosis Date   Diabetes mellitus without complication (HCC)    Hypertension     Past Surgical History:  Procedure Laterality Date   COLONOSCOPY WITH PROPOFOL  N/A 04/16/2021   Procedure: COLONOSCOPY WITH PROPOFOL ;  Surgeon: Selena Daily, MD;  Location: [facility] ENDOSCOPY;  Service: Gastroenterology;  Laterality: N/A;   none     TONSILLECTOMY      Social History   Socioeconomic History   Marital status: Single    Spouse name: Not on file   Number of children: Not on file   Years of education: Not on file   Highest education level: Not on file  Occupational History   Not on file  Tobacco Use   Smoking status: Every Day    Current packs/day: 0.50    Average packs/day: 0.5 packs/day for 26.6 years (13.3 ttl pk-yrs)    Types: Cigarettes    Start date: 01/29/1997   Smokeless tobacco: Never   Tobacco comments:    Refer to [facility]  Vaping Use   Vaping status: Never Used  Substance and Sexual Activity   Alcohol use: Not Currently    Comment: occ   Drug use: Not Currently   Sexual activity: Not on file  Other Topics Concern   Not on file  Social History Narrative   Not on file   Social Drivers of Health   Financial Resource Strain: Not on file  Food Insecurity: No Food Insecurity (07/23/2023)   Hunger Vital Sign    Worried About Running Out of Food in the Last Year: Never true    Ran Out of Food in the Last Year: Never true  Transportation Needs: No Transportation Needs (07/23/2023)   PRAPARE - Administrator, Civil Service (Medical): No     Lack of Transportation (Non-Medical): No  Physical Activity: Not on file  Stress: Not on file  Social Connections: Not on file  Intimate Partner Violence: Not At Risk (07/23/2023)   Humiliation, Afraid, Rape, and Kick questionnaire    Fear of Current or Ex-Partner: No    Emotionally Abused: No    Physically Abused: No    Sexually Abused: No    Family History  Problem Relation Age of Onset   Diabetes Mother    Hypertension Mother    Congestive Heart Failure Father    Diabetes Father    Hypertension Father     Allergies  Allergen Reactions   Contrast Media [Iodinated Contrast Media]     Renal failure   Other Rash and Other (See Comments)    Tested allergic to LIMA BEANS   Shellfish Allergy Rash and Other (See Comments)    TESTED ALLERGIC TO SHELLFISH    Outpatient Medications Prior to Visit  Medication Sig   acetaminophen  (TYLENOL ) 325 MG tablet Take 2 tablets (650 mg total) by mouth every 6 (six) hours as needed for mild pain or fever.   amLODipine  (NORVASC ) 10 MG tablet Take 1 tablet (10 mg total) by mouth daily.   carvedilol  (  COREG ) 25 MG tablet Take 1 tablet (25 mg total) by mouth 2 (two) times daily.   Cholecalciferol  125 MCG (5000 UT) capsule Take 5,000 Units by mouth daily.   Continuous Glucose Receiver (DEXCOM G7 RECEIVER) DEVI 1 Device by Does not apply route as directed.   Continuous Glucose Sensor (DEXCOM G7 SENSOR) MISC 1 Device by Does not apply route as directed. Apply new sensor every 10 days   dapagliflozin  propanediol (FARXIGA ) 10 MG TABS tablet Take 1 tablet (10 mg total) by mouth daily before breakfast.   fenofibrate  160 MG tablet Take 1 tablet (160 mg total) by mouth every morning.   folic acid  (FOLVITE ) 1 MG tablet Take 1 tablet (1 mg total) by mouth daily.   gabapentin  (NEURONTIN ) 400 MG capsule Take 1 capsule (400 mg total) by mouth 3 (three) times daily.   Insulin  Glargine (BASAGLAR  KWIKPEN) 100 UNIT/ML Inject 75 Units into the skin daily.    insulin  lispro (HUMALOG  KWIKPEN) 100 UNIT/ML KwikPen Inject 6 Units into the skin 3 (three) times daily with each meal plus add 0-15 units 3 (three) times daily per sliding scale as directed.   Insulin  Pen Needle (BD PEN NEEDLE MINI ULTRAFINE) 31G X 5 MM MISC USE WITH INSULIN  PENS   metFORMIN  (GLUCOPHAGE ) 1000 MG tablet Take 1 tablet (1,000 mg total) by mouth 2 (two) times daily with a meal.   rosuvastatin  (CRESTOR ) 40 MG tablet Take 1 tablet (40 mg total) by mouth every evening.   sildenafil (VIAGRA) 100 MG tablet TAKE 1 TABLET BY MOUTH EVERY DAY AS NEEDED 30 MINUTES TO 4 HOURS BEFORE INTERCOUSE   thiamine  (VITAMIN B-1) 100 MG tablet Take 1 tablet (100 mg total) by mouth daily.   vitamin B-12 (CYANOCOBALAMIN) 500 MCG tablet Take 500 mcg by mouth daily.   No facility-administered medications prior to visit.    Review of Systems  Constitutional: Negative.  Negative for weight loss (gained 3 lbs).  HENT: Negative.    Eyes: Negative.   Respiratory: Negative.    Cardiovascular: Negative.   Gastrointestinal: Negative.   Genitourinary: Negative.        ED  Skin: Negative.   Neurological:  Positive for sensory change.       Numbness and tingling in hands and feet  Endo/Heme/Allergies: Negative.        Objective:   BP 130/70   Pulse 74   Temp (!) 96.7 F (35.9 C)   Ht 5' 9 (1.753 m)   Wt 237 lb 6.4 oz (107.7 kg)   SpO2 95%   BMI 35.06 kg/m   Vitals:   09/07/23 1148  BP: 130/70  Pulse: 74  Temp: (!) 96.7 F (35.9 C)  Height: 5' 9 (1.753 m)  Weight: 237 lb 6.4 oz (107.7 kg)  SpO2: 95%  BMI (Calculated): 35.04    Physical Exam Vitals reviewed.  Constitutional:      Appearance: Normal appearance. He is obese.  HENT:     Head: Normocephalic.     Left Ear: There is no impacted cerumen.     Nose: Nose normal.     Mouth/Throat:     Mouth: Mucous membranes are moist.     Pharynx: No posterior oropharyngeal erythema.   Eyes:     Extraocular Movements: Extraocular  movements intact.     Pupils: Pupils are equal, round, and reactive to light.    Cardiovascular:     Rate and Rhythm: Regular rhythm.     Chest Wall: PMI is not displaced.  Pulses: Normal pulses.     Heart sounds: Normal heart sounds. No murmur heard. Pulmonary:     Effort: Pulmonary effort is normal.     Breath sounds: Normal air entry. No rhonchi or rales.  Abdominal:     General: Abdomen is flat. Bowel sounds are normal. There is no distension.     Palpations: Abdomen is soft. There is no hepatomegaly, splenomegaly or mass.     Tenderness: There is no abdominal tenderness.   Musculoskeletal:        General: Normal range of motion.     Cervical back: Normal range of motion and neck supple.     Right lower leg: No edema.     Left lower leg: No edema.   Skin:    General: Skin is warm and dry.   Neurological:     General: No focal deficit present.     Mental Status: He is alert and oriented to person, place, and time.     Cranial Nerves: No cranial nerve deficit.     Motor: No weakness.   Psychiatric:        Mood and Affect: Mood normal.        Behavior: Behavior normal.      Results for orders placed or performed in visit on 09/07/23  POCT Glucose (CBG)  Result Value Ref Range   POC Glucose 251 (A) 70 - 99 mg/dl        Assessment & Plan:  As per problem list. Given Farxiga  samples. Problem List Items Addressed This Visit       Endocrine   DKA, type 2 (HCC)   Relevant Orders   POCT Glucose (CBG) (Completed)   Diabetic polyneuropathy associated with type 2 diabetes mellitus (HCC) - Primary   Relevant Medications   Blood Glucose Monitoring Suppl (ONE TOUCH ULTRA 2) w/Device KIT   glucose blood (ONETOUCH ULTRA TEST) test strip   Lancet Devices (ONETOUCH DELICA PLUS LANCING) MISC   Lancet Devices (ONETOUCH DELICA PLUS LANCING) MISC   Other Relevant Orders   Fructosamine    Return in about 4 weeks (around 10/05/2023) for fu with labs prior.   Total  time spent: 20 minutes  Arzella Bitters, MD  09/07/2023   This document may have been prepared by [facility] Voice Recognition software and as such may include unintentional dictation errors.

## 2023-09-10 ENCOUNTER — Other Ambulatory Visit: Payer: Self-pay

## 2023-09-10 DIAGNOSIS — E1142 Type 2 diabetes mellitus with diabetic polyneuropathy: Secondary | ICD-10-CM

## 2023-09-10 MED ORDER — ONETOUCH ULTRA TEST VI STRP: ORAL_STRIP | 12 refills | Status: AC

## 2023-09-10 MED ORDER — ONETOUCH DELICA PLUS LANCING MISC
1 refills | Status: AC
Start: 2023-09-10 — End: ?

## 2023-09-17 ENCOUNTER — Other Ambulatory Visit: Payer: Self-pay

## 2023-09-18 ENCOUNTER — Other Ambulatory Visit: Payer: Self-pay

## 2023-09-18 MED FILL — Folic Acid Tab 1 MG: ORAL | 30 days supply | Qty: 30 | Fill #0 | Status: AC

## 2023-09-24 ENCOUNTER — Other Ambulatory Visit: Payer: Self-pay

## 2023-10-03 ENCOUNTER — Ambulatory Visit: Admitting: Internal Medicine

## 2023-10-08 ENCOUNTER — Other Ambulatory Visit: Payer: Self-pay

## 2023-10-09 ENCOUNTER — Other Ambulatory Visit

## 2023-10-09 ENCOUNTER — Other Ambulatory Visit: Payer: Self-pay | Admitting: Internal Medicine

## 2023-10-09 ENCOUNTER — Other Ambulatory Visit: Payer: Self-pay

## 2023-10-09 MED ORDER — FOLIC ACID 1 MG PO TABS: 1.0000 mg | ORAL_TABLET | Freq: Every day | ORAL | 0 refills | Status: DC

## 2023-10-09 MED ORDER — INSUPEN PEN NEEDLES 31G X 5 MM MISC: 0 refills | Status: DC

## 2023-10-09 MED ORDER — VITAMIN B-1 100 MG PO TABS: 100.0000 mg | ORAL_TABLET | Freq: Every day | ORAL | 2 refills | Status: DC

## 2023-10-10 ENCOUNTER — Other Ambulatory Visit: Payer: Self-pay

## 2023-10-10 LAB — BMP8+ANION GAP
Anion Gap: 17 mmol/L (ref 10.0–18.0)
BUN/Creatinine Ratio: 14 (ref 9–20)
BUN: 12 mg/dL (ref 6–24)
CO2: 25 mmol/L (ref 20–29)
Calcium: 10.2 mg/dL (ref 8.7–10.2)
Chloride: 102 mmol/L (ref 96–106)
Creatinine, Ser: 0.83 mg/dL (ref 0.76–1.27)
Glucose: 89 mg/dL (ref 70–99)
Potassium: 3.9 mmol/L (ref 3.5–5.2)
Sodium: 144 mmol/L (ref 134–144)
eGFR: 103 mL/min/1.73 (ref >59)

## 2023-10-10 LAB — FRUCTOSAMINE: Fructosamine: 311 umol/L — ABNORMAL HIGH (ref 0–285)

## 2023-10-10 MED FILL — Gabapentin Cap 400 MG: ORAL | 30 days supply | Qty: 90 | Fill #0 | Status: CN

## 2023-10-11 ENCOUNTER — Other Ambulatory Visit: Payer: Self-pay

## 2023-10-15 ENCOUNTER — Other Ambulatory Visit: Payer: Self-pay | Admitting: Internal Medicine

## 2023-10-15 ENCOUNTER — Other Ambulatory Visit: Payer: Self-pay

## 2023-10-15 ENCOUNTER — Ambulatory Visit: Admitting: Internal Medicine

## 2023-10-15 MED ORDER — FOLIC ACID 1 MG PO TABS
1.0000 mg | ORAL_TABLET | Freq: Every day | ORAL | 0 refills | Status: DC
  Filled 2023-10-15: qty 30, 30d supply, fill #0

## 2023-10-15 MED FILL — Amlodipine Besylate Tab 10 MG (Base Equivalent): ORAL | 30 days supply | Qty: 30 | Fill #0 | Status: AC

## 2023-10-15 MED FILL — Gabapentin Cap 400 MG: ORAL | 30 days supply | Qty: 90 | Fill #0 | Status: AC

## 2023-10-24 ENCOUNTER — Ambulatory Visit: Payer: Self-pay | Admitting: Internal Medicine

## 2023-10-24 ENCOUNTER — Encounter: Payer: Self-pay | Admitting: Internal Medicine

## 2023-10-24 ENCOUNTER — Ambulatory Visit: Admitting: Internal Medicine

## 2023-10-24 VITALS — BP 128/75 | HR 71 | Ht 69.0 in | Wt 236.4 lb

## 2023-10-24 DIAGNOSIS — I10 Essential (primary) hypertension: Secondary | ICD-10-CM

## 2023-10-24 DIAGNOSIS — E1142 Type 2 diabetes mellitus with diabetic polyneuropathy: Secondary | ICD-10-CM

## 2023-10-24 DIAGNOSIS — Z794 Long term (current) use of insulin: Secondary | ICD-10-CM

## 2023-10-24 DIAGNOSIS — E782 Mixed hyperlipidemia: Secondary | ICD-10-CM

## 2023-10-24 LAB — POCT CBG (FASTING - GLUCOSE)-MANUAL ENTRY: Glucose Fasting, POC: 39 mg/dL — AB (ref 70–99)

## 2023-10-24 MED ORDER — DAPAGLIFLOZIN PROPANEDIOL 10 MG PO TABS: 10.0000 mg | ORAL_TABLET | Freq: Every day | ORAL | 0 refills | Status: AC

## 2023-10-24 MED ORDER — FREESTYLE LIBRE 3 READER DEVI
1.0000 | Freq: Every day | 0 refills | Status: AC
Start: 2023-10-24 — End: 2026-07-19

## 2023-10-24 MED ORDER — FREESTYLE LIBRE 3 PLUS SENSOR MISC: 11 refills | Status: AC

## 2023-10-24 MED ORDER — BASAGLAR KWIKPEN 100 UNIT/ML ~~LOC~~ SOPN: 84.0000 [IU] | PEN_INJECTOR | SUBCUTANEOUS | 2 refills | Status: DC

## 2023-10-24 NOTE — Progress Notes (Signed)
 Established Patient Office Visit  Subjective:  Patient ID: Randy Deleon, male    DOB: Jul 27, 1965  Age: 58 y.o. MRN: [id number]  Chief Complaint  Patient presents with   Follow-up    4 week follow up with lab results.  Pt'Litisha Guagliardo feet and hans are numb and is feeling fatigued and sore.     No new complaints, here for lab review and medication refills. Labs reviewed and notable for uncontrolled diabetes, fructosamine not at target. Denies any hypoglycemic episodes. His CGM is malfunctioning and unable to monitor his blood glucose.      No other concerns at this time.   Past Medical History:  Diagnosis Date   Diabetes mellitus without complication (HCC)    Hypertension     Past Surgical History:  Procedure Laterality Date   COLONOSCOPY WITH PROPOFOL  N/A 04/16/2021   Procedure: COLONOSCOPY WITH PROPOFOL ;  Surgeon: Unk Corinn Skiff, MD;  Location: [facility] ENDOSCOPY;  Service: Gastroenterology;  Laterality: N/A;   none     TONSILLECTOMY      Social History   Socioeconomic History   Marital status: Single    Spouse name: Not on file   Number of children: Not on file   Years of education: Not on file   Highest education level: Not on file  Occupational History   Not on file  Tobacco Use   Smoking status: Every Day    Current packs/day: 0.50    Average packs/day: 0.5 packs/day for 26.7 years (13.4 ttl pk-yrs)    Types: Cigarettes    Start date: 01/29/1997   Smokeless tobacco: Never   Tobacco comments:    Refer to [facility]  Vaping Use   Vaping status: Never Used  Substance and Sexual Activity   Alcohol use: Not Currently    Comment: occ   Drug use: Not Currently   Sexual activity: Not on file  Other Topics Concern   Not on file  Social History Narrative   Not on file   Social Drivers of Health   Financial Resource Strain: Not on file  Food Insecurity: No Food Insecurity (07/23/2023)   Hunger Vital Sign    Worried About Running Out of Food in the Last Year:  Never true    Ran Out of Food in the Last Year: Never true  Transportation Needs: No Transportation Needs (07/23/2023)   PRAPARE - Administrator, Civil Service (Medical): No    Lack of Transportation (Non-Medical): No  Physical Activity: Not on file  Stress: Not on file  Social Connections: Not on file  Intimate Partner Violence: Not At Risk (07/23/2023)   Humiliation, Afraid, Rape, and Kick questionnaire    Fear of Current or Ex-Partner: No    Emotionally Abused: No    Physically Abused: No    Sexually Abused: No    Family History  Problem Relation Age of Onset   Diabetes Mother    Hypertension Mother    Congestive Heart Failure Father    Diabetes Father    Hypertension Father     Allergies  Allergen Reactions   Contrast Media [Iodinated Contrast Media]     Renal failure   Other Rash and Other (See Comments)    Tested allergic to LIMA BEANS   Shellfish Allergy Rash and Other (See Comments)    TESTED ALLERGIC TO SHELLFISH    Outpatient Medications Prior to Visit  Medication Sig   acetaminophen  (TYLENOL ) 325 MG tablet Take 2 tablets (650 mg total)  by mouth every 6 (six) hours as needed for mild pain or fever.   amLODipine  (NORVASC ) 10 MG tablet Take 1 tablet (10 mg total) by mouth daily.   carvedilol  (COREG ) 25 MG tablet Take 1 tablet (25 mg total) by mouth 2 (two) times daily.   Cholecalciferol  125 MCG (5000 UT) capsule Take 5,000 Units by mouth daily.   Continuous Glucose Receiver (DEXCOM G7 RECEIVER) DEVI 1 Device by Does not apply route as directed.   Continuous Glucose Sensor (DEXCOM G7 SENSOR) MISC 1 Device by Does not apply route as directed. Apply new sensor every 10 days   fenofibrate  160 MG tablet Take 1 tablet (160 mg total) by mouth every morning.   folic acid  (FOLVITE ) 1 MG tablet Take 1 tablet (1 mg total) by mouth daily.   folic acid  (FOLVITE ) 1 MG tablet Take 1 tablet (1 mg total) by mouth daily.   gabapentin  (NEURONTIN ) 400 MG capsule Take 1  capsule (400 mg total) by mouth 3 (three) times daily.   glucose blood (ONETOUCH ULTRA TEST) test strip Use with device to check sugar three times daily   insulin  lispro (HUMALOG  KWIKPEN) 100 UNIT/ML KwikPen Inject 6 Units into the skin 3 (three) times daily with each meal plus add 0-15 units 3 (three) times daily per sliding scale as directed.   Insulin  Pen Needle (BD PEN NEEDLE MINI ULTRAFINE) 31G X 5 MM MISC USE WITH INSULIN  PENS   Insulin  Pen Needle (INSUPEN PEN NEEDLES) 31G X 5 MM MISC Use as directed with insulin    Lancet Devices (ONETOUCH DELICA PLUS LANCING) MISC Use with device to check sugars three times daily   metFORMIN  (GLUCOPHAGE ) 1000 MG tablet Take 1 tablet (1,000 mg total) by mouth 2 (two) times daily with a meal.   rosuvastatin  (CRESTOR ) 40 MG tablet Take 1 tablet (40 mg total) by mouth every evening.   sildenafil (VIAGRA) 100 MG tablet TAKE 1 TABLET BY MOUTH EVERY DAY AS NEEDED 30 MINUTES TO 4 HOURS BEFORE INTERCOUSE   thiamine  (VITAMIN B-1) 100 MG tablet Take 1 tablet (100 mg total) by mouth daily.   vitamin B-12 (CYANOCOBALAMIN) 500 MCG tablet Take 500 mcg by mouth daily.   [DISCONTINUED] dapagliflozin  propanediol (FARXIGA ) 10 MG TABS tablet Take 1 tablet (10 mg total) by mouth daily before breakfast.   [DISCONTINUED] Insulin  Glargine (BASAGLAR  KWIKPEN) 100 UNIT/ML Inject 75 Units into the skin daily.   No facility-administered medications prior to visit.    Review of Systems  Constitutional: Negative.  Negative for weight loss (gained 3 lbs).  HENT: Negative.    Eyes: Negative.   Respiratory: Negative.    Cardiovascular: Negative.   Gastrointestinal: Negative.   Genitourinary: Negative.        ED  Skin: Negative.   Neurological:  Positive for sensory change.       Numbness and tingling in hands and feet  Endo/Heme/Allergies: Negative.        Objective:   BP 128/75   Pulse 71   Ht 5' 9 (1.753 m)   Wt 236 lb 6.4 oz (107.2 kg)   SpO2 97%   BMI 34.91 kg/m    Vitals:   10/24/23 1146  BP: 128/75  Pulse: 71  Height: 5' 9 (1.753 m)  Weight: 236 lb 6.4 oz (107.2 kg)  SpO2: 97%  BMI (Calculated): 34.89    Physical Exam Vitals reviewed.  Constitutional:      Appearance: Normal appearance. He is obese.  HENT:     Head:  Normocephalic.     Left Ear: There is no impacted cerumen.     Nose: Nose normal.     Mouth/Throat:     Mouth: Mucous membranes are moist.     Pharynx: No posterior oropharyngeal erythema.  Eyes:     Extraocular Movements: Extraocular movements intact.     Pupils: Pupils are equal, round, and reactive to light.  Cardiovascular:     Rate and Rhythm: Regular rhythm.     Chest Wall: PMI is not displaced.     Pulses: Normal pulses.     Heart sounds: Normal heart sounds. No murmur heard. Pulmonary:     Effort: Pulmonary effort is normal.     Breath sounds: Normal air entry. No rhonchi or rales.  Abdominal:     General: Abdomen is flat. Bowel sounds are normal. There is no distension.     Palpations: Abdomen is soft. There is no hepatomegaly, splenomegaly or mass.     Tenderness: There is no abdominal tenderness.  Musculoskeletal:        General: Normal range of motion.     Cervical back: Normal range of motion and neck supple.     Right lower leg: No edema.     Left lower leg: No edema.  Skin:    General: Skin is warm and dry.  Neurological:     General: No focal deficit present.     Mental Status: He is alert and oriented to person, place, and time.     Cranial Nerves: No cranial nerve deficit.     Motor: No weakness.  Psychiatric:        Mood and Affect: Mood normal.        Behavior: Behavior normal.      Results for orders placed or performed in visit on 10/24/23  POCT CBG (Fasting - Glucose)  Result Value Ref Range   Glucose Fasting, POC 39 (A) 70 - 99 mg/dL    Recent Results (from the past 2160 hours)  Fructosamine     Status: Abnormal   Collection Time: 08/14/23  9:36 AM  Result Value Ref Range    Fructosamine 387 (H) 0 - 285 umol/L    Comment: Published reference interval for apparently healthy subjects between age 23 and 78 is 31 - 285 umol/L and in a poorly controlled diabetic population is 228 - 563 umol/L with a mean of 396 umol/L.   POCT CBG (Fasting - Glucose)     Status: Abnormal   Collection Time: 08/15/23  8:54 AM  Result Value Ref Range   Glucose Fasting, POC 240 (A) 70 - 99 mg/dL  POCT Glucose (CBG)     Status: Abnormal   Collection Time: 09/07/23 11:54 AM  Result Value Ref Range   POC Glucose 251 (A) 70 - 99 mg/dl  AFE1+Jwpnw Gap     Status: None   Collection Time: 10/09/23  9:31 AM  Result Value Ref Range   Glucose 89 70 - 99 mg/dL   BUN 12 6 - 24 mg/dL   Creatinine, Ser 9.16 0.76 - 1.27 mg/dL   eGFR 896 >40 fO/fpw/8.26   BUN/Creatinine Ratio 14 9 - 20   Sodium 144 134 - 144 mmol/L   Potassium 3.9 3.5 - 5.2 mmol/L   Chloride 102 96 - 106 mmol/L   CO2 25 20 - 29 mmol/L   Anion Gap 17.0 10.0 - 18.0 mmol/L   Calcium  10.2 8.7 - 10.2 mg/dL  Fructosamine     Status: Abnormal  Collection Time: 10/09/23  9:31 AM  Result Value Ref Range   Fructosamine 311 (H) 0 - 285 umol/L    Comment: Published reference interval for apparently healthy subjects between age 65 and 14 is 59 - 285 umol/L and in a poorly controlled diabetic population is 228 - 563 umol/L with a mean of 396 umol/L.   POCT CBG (Fasting - Glucose)     Status: Abnormal   Collection Time: 10/24/23 11:58 AM  Result Value Ref Range   Glucose Fasting, POC 39 (A) 70 - 99 mg/dL      Assessment & Plan:  Randy Deleon was seen today for follow-up.  Diabetic polyneuropathy associated with type 2 diabetes mellitus (HCC) -     POCT CBG (Fasting - Glucose) -     Hemoglobin A1c -     FreeStyle Libre 3 Plus Sensor; Change sensor every 15 days.  Dispense: 2 each; Refill: 11 -     FreeStyle Libre 3 Reader; 1 Device by Does not apply route daily.  Dispense: 1 each; Refill: 0  Type 2 diabetes mellitus without  complication, with long-term current use of insulin  (HCC) -     Dapagliflozin  Propanediol; Take 1 tablet (10 mg total) by mouth daily before breakfast.  Dispense: 90 tablet; Refill: 0  Mixed hyperlipidemia -     Comprehensive metabolic panel with GFR -     Lipid panel  Primary hypertension  Other orders -     Basaglar  KwikPen; Inject 84 Units into the skin daily.  Dispense: 15 mL; Refill: 2    Problem List Items Addressed This Visit       Cardiovascular and Mediastinum   HTN (hypertension)     Endocrine   Diabetic polyneuropathy associated with type 2 diabetes mellitus (HCC) - Primary   Relevant Medications   dapagliflozin  propanediol (FARXIGA ) 10 MG TABS tablet   Insulin  Glargine (BASAGLAR  KWIKPEN) 100 UNIT/ML   Continuous Glucose Sensor (FREESTYLE LIBRE 3 PLUS SENSOR) MISC   Continuous Glucose Receiver (FREESTYLE LIBRE 3 READER) DEVI   Other Relevant Orders   POCT CBG (Fasting - Glucose) (Completed)   Hemoglobin A1c     Other   HLD (hyperlipidemia)   Relevant Orders   Comprehensive metabolic panel with GFR   Lipid panel   Other Visit Diagnoses       Type 2 diabetes mellitus without complication, with long-term current use of insulin  (HCC)       Relevant Medications   dapagliflozin  propanediol (FARXIGA ) 10 MG TABS tablet   Insulin  Glargine (BASAGLAR  KWIKPEN) 100 UNIT/ML       Return in about 4 weeks (around 11/21/2023) for fu with labs prior.   Total time spent: 20 minutes  Sherrill Cinderella Perry, MD  10/24/2023   This document may have been prepared by [facility] Voice Recognition software and as such may include unintentional dictation errors.

## 2023-10-25 ENCOUNTER — Telehealth: Payer: Self-pay

## 2023-10-25 NOTE — Telephone Encounter (Signed)
 Please call the patient and find out if the patient wants the Dexcom or the [employer] so I know which PA to do

## 2023-10-26 NOTE — Telephone Encounter (Signed)
Pt wants the freestyle libre.

## 2023-11-01 ENCOUNTER — Other Ambulatory Visit: Payer: Self-pay

## 2023-11-01 NOTE — Telephone Encounter (Signed)
 Medlist updated and will work on [employer]

## 2023-11-05 ENCOUNTER — Other Ambulatory Visit: Payer: Self-pay

## 2023-11-05 ENCOUNTER — Other Ambulatory Visit: Payer: Self-pay | Admitting: Internal Medicine

## 2023-11-05 DIAGNOSIS — E782 Mixed hyperlipidemia: Secondary | ICD-10-CM

## 2023-11-05 MED FILL — Fenofibrate Tab 160 MG: ORAL | 30 days supply | Qty: 30 | Fill #0 | Status: CN

## 2023-11-05 MED FILL — Thiamine Mononitrate Tab 100 MG: ORAL | 30 days supply | Qty: 30 | Fill #0 | Status: AC

## 2023-11-06 ENCOUNTER — Other Ambulatory Visit: Payer: Self-pay

## 2023-11-12 MED FILL — Gabapentin Cap 400 MG: ORAL | 30 days supply | Qty: 90 | Fill #1 | Status: AC

## 2023-11-13 ENCOUNTER — Other Ambulatory Visit: Payer: Self-pay

## 2023-11-15 ENCOUNTER — Other Ambulatory Visit: Payer: Self-pay

## 2023-11-20 ENCOUNTER — Telehealth: Payer: Self-pay

## 2023-11-20 NOTE — Telephone Encounter (Signed)
 Patient LM asking for call back about ling acting insulin  (basalgar), rx was sent to [facility] on 8/6 need to find out if this is the correct pharmacy or not, unable to LM for pt to call back

## 2023-11-22 NOTE — Telephone Encounter (Signed)
 Patient Randy Deleon again today about this, patient needs a call back

## 2023-11-26 ENCOUNTER — Other Ambulatory Visit: Payer: Self-pay | Admitting: Internal Medicine

## 2023-11-26 ENCOUNTER — Other Ambulatory Visit: Payer: Self-pay

## 2023-11-26 MED ORDER — BASAGLAR KWIKPEN 100 UNIT/ML ~~LOC~~ SOPN: 84.0000 [IU] | PEN_INJECTOR | SUBCUTANEOUS | 2 refills | Status: DC

## 2023-11-26 MED ORDER — FOLIC ACID 1 MG PO TABS
1.0000 mg | ORAL_TABLET | Freq: Every day | ORAL | 3 refills | Status: DC
  Filled 2023-11-26: qty 30, 30d supply, fill #0

## 2023-11-26 MED FILL — Amlodipine Besylate Tab 10 MG (Base Equivalent): ORAL | 30 days supply | Qty: 30 | Fill #1 | Status: AC

## 2023-11-26 MED FILL — Fenofibrate Tab 160 MG: ORAL | 30 days supply | Qty: 30 | Fill #0 | Status: AC

## 2023-11-26 NOTE — Telephone Encounter (Signed)
 Spoke with the patient and it was the basaglar  that he needed, we had sent this on 8/6 with 2 refills and I reissued this today due to the patient stating they told him they didn't have anything for the patient

## 2023-11-28 ENCOUNTER — Other Ambulatory Visit

## 2023-11-29 LAB — COMPREHENSIVE METABOLIC PANEL WITH GFR
ALT: 11 IU/L (ref 0–44)
AST: 11 IU/L (ref 0–40)
Albumin: 4.6 g/dL (ref 3.8–4.9)
Alkaline Phosphatase: 46 IU/L (ref 44–121)
BUN/Creatinine Ratio: 12 (ref 9–20)
BUN: 9 mg/dL (ref 6–24)
Bilirubin Total: 0.3 mg/dL (ref 0.0–1.2)
CO2: 24 mmol/L (ref 20–29)
Calcium: 10 mg/dL (ref 8.7–10.2)
Chloride: 104 mmol/L (ref 96–106)
Creatinine, Ser: 0.76 mg/dL (ref 0.76–1.27)
Globulin, Total: 2.2 g/dL (ref 1.5–4.5)
Glucose: 68 mg/dL — ABNORMAL LOW (ref 70–99)
Potassium: 3.8 mmol/L (ref 3.5–5.2)
Sodium: 143 mmol/L (ref 134–144)
Total Protein: 6.8 g/dL (ref 6.0–8.5)
eGFR: 105 mL/min/1.73 (ref >59)

## 2023-11-29 LAB — LIPID PANEL
Chol/HDL Ratio: 6.9 ratio — ABNORMAL HIGH (ref 0.0–5.0)
Cholesterol, Total: 243 mg/dL — ABNORMAL HIGH (ref 100–199)
HDL: 35 mg/dL — ABNORMAL LOW (ref >39)
LDL Chol Calc (NIH): 170 mg/dL — ABNORMAL HIGH (ref 0–99)
Triglycerides: 204 mg/dL — ABNORMAL HIGH (ref 0–149)
VLDL Cholesterol Cal: 38 mg/dL (ref 5–40)

## 2023-11-29 LAB — HEMOGLOBIN A1C
Est. average glucose Bld gHb Est-mCnc: 163 mg/dL
Hgb A1c MFr Bld: 7.3 % — ABNORMAL HIGH (ref 4.8–5.6)

## 2023-11-30 ENCOUNTER — Ambulatory Visit: Payer: Self-pay | Admitting: Internal Medicine

## 2023-11-30 ENCOUNTER — Ambulatory Visit: Admitting: Internal Medicine

## 2023-11-30 VITALS — BP 132/76 | HR 71 | Temp 97.9°F | Ht 69.0 in | Wt 223.6 lb

## 2023-11-30 DIAGNOSIS — E782 Mixed hyperlipidemia: Secondary | ICD-10-CM | POA: Diagnosis not present

## 2023-11-30 DIAGNOSIS — I10 Essential (primary) hypertension: Secondary | ICD-10-CM | POA: Diagnosis not present

## 2023-11-30 DIAGNOSIS — R809 Proteinuria, unspecified: Secondary | ICD-10-CM | POA: Diagnosis not present

## 2023-11-30 DIAGNOSIS — E1142 Type 2 diabetes mellitus with diabetic polyneuropathy: Secondary | ICD-10-CM

## 2023-11-30 DIAGNOSIS — Z794 Long term (current) use of insulin: Secondary | ICD-10-CM

## 2023-11-30 DIAGNOSIS — E1129 Type 2 diabetes mellitus with other diabetic kidney complication: Secondary | ICD-10-CM

## 2023-11-30 LAB — POC CREATINE & ALBUMIN,URINE
Creatinine, POC: 30 mg/dL
Microalbumin Ur, POC: 100 mg/L

## 2023-11-30 LAB — POCT CBG (FASTING - GLUCOSE)-MANUAL ENTRY: Glucose Fasting, POC: 66 mg/dL — AB (ref 70–99)

## 2023-11-30 MED ORDER — BASAGLAR KWIKPEN 100 UNIT/ML ~~LOC~~ SOPN: 92.0000 [IU] | PEN_INJECTOR | SUBCUTANEOUS | 0 refills | Status: DC

## 2023-11-30 MED ORDER — EZETIMIBE 10 MG PO TABS: 10.0000 mg | ORAL_TABLET | Freq: Every day | ORAL | 11 refills | Status: DC

## 2023-11-30 MED ORDER — KERENDIA 10 MG PO TABS: 10.0000 mg | ORAL_TABLET | Freq: Every day | ORAL | 0 refills | Status: AC

## 2023-11-30 MED ORDER — METFORMIN HCL 1000 MG PO TABS: 1000.0000 mg | ORAL_TABLET | Freq: Two times a day (BID) | ORAL | 0 refills | Status: DC

## 2023-11-30 NOTE — Progress Notes (Signed)
 Established Patient Office Visit  Subjective:  Patient ID: Randy Deleon, male    DOB: [birth year]  Age: 58 y.o. MRN: [id number]  Chief Complaint  Patient presents with   Follow-up    1 month lab results    No new complaints, here for lab review and medication refills. Labs reviewed and notable for improved but uncontrolled diabetes, A1c still not at target, lipids high with unremarkable cmp. Admits to hypoglycemic episodes and unable to check home bg readings as CGM malfunctioning.     No other concerns at this time.   Past Medical History:  Diagnosis Date   Diabetes mellitus without complication (HCC)    Hypertension     Past Surgical History:  Procedure Laterality Date   COLONOSCOPY WITH PROPOFOL  N/A 04/16/2021   Procedure: COLONOSCOPY WITH PROPOFOL ;  Surgeon: Unk Corinn Skiff, MD;  Location: [facility] ENDOSCOPY;  Service: Gastroenterology;  Laterality: N/A;   none     TONSILLECTOMY      Social History   Socioeconomic History   Marital status: Single    Spouse name: Not on file   Number of children: Not on file   Years of education: Not on file   Highest education level: Not on file  Occupational History   Not on file  Tobacco Use   Smoking status: Every Day    Current packs/day: 0.50    Average packs/day: 0.5 packs/day for 26.8 years (13.4 ttl pk-yrs)    Types: Cigarettes    Start date: 01/29/1997   Smokeless tobacco: Never   Tobacco comments:    Refer to [facility]  Vaping Use   Vaping status: Never Used  Substance and Sexual Activity   Alcohol use: Not Currently    Comment: occ   Drug use: Not Currently   Sexual activity: Not on file  Other Topics Concern   Not on file  Social History Narrative   Not on file   Social Drivers of Health   Financial Resource Strain: Not on file  Food Insecurity: No Food Insecurity (07/23/2023)   Hunger Vital Sign    Worried About Running Out of Food in the Last Year: Never true    Ran Out of Food in the Last  Year: Never true  Transportation Needs: No Transportation Needs (07/23/2023)   PRAPARE - Administrator, Civil Service (Medical): No    Lack of Transportation (Non-Medical): No  Physical Activity: Not on file  Stress: Not on file  Social Connections: Not on file  Intimate Partner Violence: Not At Risk (07/23/2023)   Humiliation, Afraid, Rape, and Kick questionnaire    Fear of Current or Ex-Partner: No    Emotionally Abused: No    Physically Abused: No    Sexually Abused: No    Family History  Problem Relation Age of Onset   Diabetes Mother    Hypertension Mother    Congestive Heart Failure Father    Diabetes Father    Hypertension Father     Allergies  Allergen Reactions   Contrast Media [Iodinated Contrast Media]     Renal failure   Other Rash and Other (See Comments)    Tested allergic to LIMA BEANS   Shellfish Allergy Rash and Other (See Comments)    TESTED ALLERGIC TO SHELLFISH    Outpatient Medications Prior to Visit  Medication Sig   acetaminophen  (TYLENOL ) 325 MG tablet Take 2 tablets (650 mg total) by mouth every 6 (six) hours as needed for mild pain  or fever.   amLODipine  (NORVASC ) 10 MG tablet Take 1 tablet (10 mg total) by mouth daily.   carvedilol  (COREG ) 25 MG tablet Take 1 tablet (25 mg total) by mouth 2 (two) times daily.   dapagliflozin  propanediol (FARXIGA ) 10 MG TABS tablet Take 1 tablet (10 mg total) by mouth daily before breakfast.   fenofibrate  160 MG tablet Take 1 tablet (160 mg total) by mouth every morning.   folic acid  (FOLVITE ) 1 MG tablet Take 1 tablet (1 mg total) by mouth daily.   gabapentin  (NEURONTIN ) 400 MG capsule Take 1 capsule (400 mg total) by mouth 3 (three) times daily.   glucose blood (ONETOUCH ULTRA TEST) test strip Use with device to check sugar three times daily   insulin  lispro (HUMALOG  KWIKPEN) 100 UNIT/ML KwikPen Inject 6 Units into the skin 3 (three) times daily with each meal plus add 0-15 units 3 (three) times daily  per sliding scale as directed.   Insulin  Pen Needle (BD PEN NEEDLE MINI ULTRAFINE) 31G X 5 MM MISC USE WITH INSULIN  PENS   Insulin  Pen Needle (INSUPEN PEN NEEDLES) 31G X 5 MM MISC Use as directed with insulin    Lancet Devices (ONETOUCH DELICA PLUS LANCING) MISC Use with device to check sugars three times daily   rosuvastatin  (CRESTOR ) 40 MG tablet Take 1 tablet (40 mg total) by mouth every evening.   sildenafil (VIAGRA) 100 MG tablet TAKE 1 TABLET BY MOUTH EVERY DAY AS NEEDED 30 MINUTES TO 4 HOURS BEFORE INTERCOUSE   thiamine  (VITAMIN B1) 100 MG tablet Take 1 tablet (100 mg total) by mouth daily.   [DISCONTINUED] Insulin  Glargine (BASAGLAR  KWIKPEN) 100 UNIT/ML Inject 84 Units into the skin daily.   [DISCONTINUED] metFORMIN  (GLUCOPHAGE ) 1000 MG tablet Take 1 tablet (1,000 mg total) by mouth 2 (two) times daily with a meal.   Cholecalciferol  125 MCG (5000 UT) capsule Take 5,000 Units by mouth daily. (Patient not taking: Reported on 11/30/2023)   Continuous Glucose Receiver (FREESTYLE LIBRE 3 READER) DEVI 1 Device by Does not apply route daily. (Patient not taking: Reported on 11/30/2023)   Continuous Glucose Sensor (DEXCOM G7 SENSOR) MISC 1 Device by Does not apply route as directed. Apply new sensor every 10 days (Patient not taking: Reported on 11/30/2023)   Continuous Glucose Sensor (FREESTYLE LIBRE 3 PLUS SENSOR) MISC Change sensor every 15 days. (Patient not taking: Reported on 11/30/2023)   folic acid  (FOLVITE ) 1 MG tablet Take 1 tablet (1 mg total) by mouth daily.   vitamin B-12 (CYANOCOBALAMIN) 500 MCG tablet Take 500 mcg by mouth daily. (Patient not taking: Reported on 11/30/2023)   No facility-administered medications prior to visit.    Review of Systems  Constitutional:  Positive for weight loss (13 lbs).  HENT: Negative.    Eyes: Negative.   Respiratory: Negative.    Cardiovascular: Negative.   Gastrointestinal: Negative.   Genitourinary: Negative.        ED  Skin: Negative.    Neurological:  Positive for sensory change.       Numbness and tingling in hands and feet  Endo/Heme/Allergies: Negative.        Objective:   BP 132/76   Pulse 71   Temp 97.9 F (36.6 C)   Ht 5' 9 (1.753 m)   Wt 223 lb 9.6 oz (101.4 kg)   SpO2 98%   BMI 33.02 kg/m   Vitals:   11/30/23 0951  BP: 132/76  Pulse: 71  Temp: 97.9 F (36.6 C)  Height:  5' 9 (1.753 m)  Weight: 223 lb 9.6 oz (101.4 kg)  SpO2: 98%  BMI (Calculated): 33    Physical Exam Vitals reviewed.  Constitutional:      Appearance: Normal appearance. He is obese.  HENT:     Head: Normocephalic.     Left Ear: There is no impacted cerumen.     Nose: Nose normal.     Mouth/Throat:     Mouth: Mucous membranes are moist.     Pharynx: No posterior oropharyngeal erythema.  Eyes:     Extraocular Movements: Extraocular movements intact.     Pupils: Pupils are equal, round, and reactive to light.  Cardiovascular:     Rate and Rhythm: Regular rhythm.     Chest Wall: PMI is not displaced.     Pulses: Normal pulses.     Heart sounds: Normal heart sounds. No murmur heard. Pulmonary:     Effort: Pulmonary effort is normal.     Breath sounds: Normal air entry. No rhonchi or rales.  Abdominal:     General: Abdomen is flat. Bowel sounds are normal. There is no distension.     Palpations: Abdomen is soft. There is no hepatomegaly, splenomegaly or mass.     Tenderness: There is no abdominal tenderness.  Musculoskeletal:        General: Normal range of motion.     Cervical back: Normal range of motion and neck supple.     Right lower leg: No edema.     Left lower leg: No edema.  Skin:    General: Skin is warm and dry.  Neurological:     General: No focal deficit present.     Mental Status: He is alert and oriented to person, place, and time.     Cranial Nerves: No cranial nerve deficit.     Motor: No weakness.  Psychiatric:        Mood and Affect: Mood normal.        Behavior: Behavior normal.       Results for orders placed or performed in visit on 11/30/23  POCT CBG (Fasting - Glucose)  Result Value Ref Range   Glucose Fasting, POC 66 (A) 70 - 99 mg/dL    Recent Results (from the past 2160 hours)  POCT Glucose (CBG)     Status: Abnormal   Collection Time: 09/07/23 11:54 AM  Result Value Ref Range   POC Glucose 251 (A) 70 - 99 mg/dl  AFE1+Jwpnw Gap     Status: None   Collection Time: 10/09/23  9:31 AM  Result Value Ref Range   Glucose 89 70 - 99 mg/dL   BUN 12 6 - 24 mg/dL   Creatinine, Ser 9.16 0.76 - 1.27 mg/dL   eGFR 896 >40 fO/fpw/8.26   BUN/Creatinine Ratio 14 9 - 20   Sodium 144 134 - 144 mmol/L   Potassium 3.9 3.5 - 5.2 mmol/L   Chloride 102 96 - 106 mmol/L   CO2 25 20 - 29 mmol/L   Anion Gap 17.0 10.0 - 18.0 mmol/L   Calcium  10.2 8.7 - 10.2 mg/dL  Fructosamine     Status: Abnormal   Collection Time: 10/09/23  9:31 AM  Result Value Ref Range   Fructosamine 311 (H) 0 - 285 umol/L    Comment: Published reference interval for apparently healthy subjects between age 95 and 55 is 32 - 285 umol/L and in a poorly controlled diabetic population is 228 - 563 umol/L with a mean of 396  umol/L.   POCT CBG (Fasting - Glucose)     Status: Abnormal   Collection Time: 10/24/23 11:58 AM  Result Value Ref Range   Glucose Fasting, POC 39 (A) 70 - 99 mg/dL  Comprehensive metabolic panel with GFR     Status: Abnormal   Collection Time: 11/28/23 11:30 AM  Result Value Ref Range   Glucose 68 (L) 70 - 99 mg/dL   BUN 9 6 - 24 mg/dL   Creatinine, Ser 9.23 0.76 - 1.27 mg/dL   eGFR 894 >40 fO/fpw/8.26   BUN/Creatinine Ratio 12 9 - 20   Sodium 143 134 - 144 mmol/L   Potassium 3.8 3.5 - 5.2 mmol/L   Chloride 104 96 - 106 mmol/L   CO2 24 20 - 29 mmol/L   Calcium  10.0 8.7 - 10.2 mg/dL   Total Protein 6.8 6.0 - 8.5 g/dL   Albumin 4.6 3.8 - 4.9 g/dL   Globulin, Total 2.2 1.5 - 4.5 g/dL   Bilirubin Total 0.3 0.0 - 1.2 mg/dL   Alkaline Phosphatase 46 44 - 121 IU/L     Comment: **Effective December 03, 2023 Alkaline Phosphatase**   reference interval will be changing to:              Age                Male          Male           0 -  5 days         47 - 127       47 - 127           6 - 10 days         29 - 242       29 - 242          11 - 20 days        109 - 357      109 - 357          21 - 30 days         94 - 494       94 - 494           1 -  2 months      149 - 539      149 - 539           3 -  6 months      131 - 452      131 - 452           7 - 11 months      117 - 401      117 - 401   12 months -  6 years       158 - 369      158 - 369           7 - 12 years       150 - 409      150 - 409               13 years       156 - 435       78 - 227               14 years       114 - 375       64 - 161  15 years        88 - 279       56 - 134               16 years        74 - 207       51 - 121               17 years        63 - 161       47 - 113          18 - 20 years        51 - 125       42 - 106          21 - 50 years         47 - 123       41 - 116          51 - 80 years        49 - 135       51 - 125              >80 years        48 - 129       48 - 129    AST 11 0 - 40 IU/L   ALT 11 0 - 44 IU/L  Lipid panel     Status: Abnormal   Collection Time: 11/28/23 11:30 AM  Result Value Ref Range   Cholesterol, Total 243 (H) 100 - 199 mg/dL   Triglycerides 795 (H) 0 - 149 mg/dL   HDL 35 (L) >60 mg/dL   VLDL Cholesterol Cal 38 5 - 40 mg/dL   LDL Chol Calc (NIH) 829 (H) 0 - 99 mg/dL   Chol/HDL Ratio 6.9 (H) 0.0 - 5.0 ratio    Comment:                                   T. Chol/HDL Ratio                                             Men  Women                               1/2 Avg.Risk  3.4    3.3                                   Avg.Risk  5.0    4.4                                2X Avg.Risk  9.6    7.1                                3X Avg.Risk 23.4   11.0   Hemoglobin A1c     Status: Abnormal   Collection Time: 11/28/23 11:30  AM  Result Value Ref Range   Hgb A1c MFr Bld 7.3 (H) 4.8 - 5.6 %    Comment:  Prediabetes: 5.7 - 6.4          Diabetes: >6.4          Glycemic control for adults with diabetes: <7.0    Est. average glucose Bld gHb Est-mCnc 163 mg/dL  POCT CBG (Fasting - Glucose)     Status: Abnormal   Collection Time: 11/30/23 10:02 AM  Result Value Ref Range   Glucose Fasting, POC 66 (A) 70 - 99 mg/dL      Assessment & Plan:  Randy Deleon was seen today for follow-up.  Type 2 diabetes mellitus with diabetic polyneuropathy, with long-term current use of insulin  (HCC) -     Hemoglobin A1c; Standing  Type 2 diabetes mellitus without complication, with long-term current use of insulin  (HCC) -     POCT CBG (Fasting - Glucose) -     Basaglar  KwikPen; Inject 92 Units into the skin daily.  Dispense: 82.8 mL; Refill: 0 -     metFORMIN  HCl; Take 1 tablet (1,000 mg total) by mouth 2 (two) times daily with a meal.  Dispense: 180 tablet; Refill: 0 -     POC CREATINE & ALBUMIN,URINE -     Fructosamine  Mixed hyperlipidemia -     Ezetimibe ; Take 1 tablet (10 mg total) by mouth daily.  Dispense: 30 tablet; Refill: 11 -     Lipid panel; Standing  Diabetic polyneuropathy associated with type 2 diabetes mellitus (HCC)  Primary hypertension    Problem List Items Addressed This Visit       Cardiovascular and Mediastinum   HTN (hypertension)   Relevant Medications   ezetimibe  (ZETIA ) 10 MG tablet     Endocrine   Type 2 diabetes mellitus with diabetic polyneuropathy, with long-term current use of insulin  (HCC) - Primary   Relevant Medications   Insulin  Glargine (BASAGLAR  KWIKPEN) 100 UNIT/ML   metFORMIN  (GLUCOPHAGE ) 1000 MG tablet   Other Relevant Orders   Hemoglobin A1c     Other   HLD (hyperlipidemia)   Relevant Medications   ezetimibe  (ZETIA ) 10 MG tablet   Other Relevant Orders   Lipid panel   Other Visit Diagnoses       Type 2 diabetes mellitus without complication, with long-term  current use of insulin  (HCC)       Relevant Medications   Insulin  Glargine (BASAGLAR  KWIKPEN) 100 UNIT/ML   metFORMIN  (GLUCOPHAGE ) 1000 MG tablet   Other Relevant Orders   POCT CBG (Fasting - Glucose) (Completed)   POC CREATINE & ALBUMIN,URINE   Fructosamine       Return in about 6 weeks (around 01/11/2024) for fu with labs prior.   Total time spent: 20 minutes  Sherrill Cinderella Perry, MD  11/30/2023   This document may have been prepared by [facility] Voice Recognition software and as such may include unintentional dictation errors.

## 2023-12-04 NOTE — Progress Notes (Signed)
 Left VM to return call

## 2023-12-06 NOTE — Progress Notes (Signed)
Pt informed

## 2023-12-11 ENCOUNTER — Other Ambulatory Visit: Payer: Self-pay | Admitting: Internal Medicine

## 2023-12-11 ENCOUNTER — Other Ambulatory Visit: Payer: Self-pay

## 2023-12-11 MED ORDER — GABAPENTIN 400 MG PO CAPS
400.0000 mg | ORAL_CAPSULE | Freq: Three times a day (TID) | ORAL | 1 refills | Status: DC
  Filled 2023-12-11 – 2023-12-13 (×2): qty 90, 30d supply, fill #0

## 2023-12-13 ENCOUNTER — Other Ambulatory Visit: Payer: Self-pay

## 2023-12-24 ENCOUNTER — Other Ambulatory Visit: Payer: Self-pay

## 2023-12-28 ENCOUNTER — Other Ambulatory Visit: Payer: Self-pay | Admitting: Internal Medicine

## 2023-12-28 ENCOUNTER — Telehealth: Payer: Self-pay

## 2023-12-28 DIAGNOSIS — I10 Essential (primary) hypertension: Secondary | ICD-10-CM

## 2023-12-28 DIAGNOSIS — E782 Mixed hyperlipidemia: Secondary | ICD-10-CM

## 2023-12-28 MED ORDER — BD PEN NEEDLE MINI ULTRAFINE 31G X 5 MM MISC: 3 refills | Status: AC

## 2023-12-28 MED ORDER — AMLODIPINE BESYLATE 10 MG PO TABS: 10.0000 mg | ORAL_TABLET | Freq: Every day | ORAL | 3 refills | Status: AC

## 2023-12-28 MED ORDER — THIAMINE MONONITRATE 100 MG PO TABS: 100.0000 mg | ORAL_TABLET | Freq: Every day | ORAL | 2 refills | Status: AC

## 2023-12-28 MED ORDER — FOLIC ACID 1 MG PO TABS: 1.0000 mg | ORAL_TABLET | Freq: Every day | ORAL | 3 refills | Status: AC

## 2023-12-28 NOTE — Telephone Encounter (Signed)
 Patient Randy Deleon asking for refills but did not state which ones he was needing , will call him then send the request to the pharmacy

## 2023-12-28 NOTE — Telephone Encounter (Signed)
 Spoke with patient and he needed refills on carvedilol , fenofibrate , amlodipine , Thiamine , pen needles, carvedilol  and gabapentin , this have already been sent to the pharmacy for the patient by the provider

## 2024-01-09 ENCOUNTER — Other Ambulatory Visit

## 2024-01-10 LAB — BMP8+ANION GAP
Anion Gap: 13 mmol/L (ref 10.0–18.0)
BUN/Creatinine Ratio: 14 (ref 9–20)
BUN: 10 mg/dL (ref 6–24)
CO2: 26 mmol/L (ref 20–29)
Calcium: 9.8 mg/dL (ref 8.7–10.2)
Chloride: 103 mmol/L (ref 96–106)
Creatinine, Ser: 0.69 mg/dL — ABNORMAL LOW (ref 0.76–1.27)
Glucose: 52 mg/dL — ABNORMAL LOW (ref 70–99)
Potassium: 3.9 mmol/L (ref 3.5–5.2)
Sodium: 142 mmol/L (ref 134–144)
eGFR: 109 mL/min/1.73 (ref >59)

## 2024-01-10 LAB — FRUCTOSAMINE: Fructosamine: 257 umol/L (ref 0–285)

## 2024-01-11 ENCOUNTER — Ambulatory Visit: Admitting: Internal Medicine

## 2024-01-11 ENCOUNTER — Encounter: Payer: Self-pay | Admitting: Internal Medicine

## 2024-01-11 VITALS — BP 130/72 | HR 64 | Ht 69.0 in | Wt 231.0 lb

## 2024-01-11 DIAGNOSIS — E1142 Type 2 diabetes mellitus with diabetic polyneuropathy: Secondary | ICD-10-CM | POA: Diagnosis not present

## 2024-01-11 DIAGNOSIS — E119 Type 2 diabetes mellitus without complications: Secondary | ICD-10-CM | POA: Diagnosis not present

## 2024-01-11 DIAGNOSIS — Z23 Encounter for immunization: Secondary | ICD-10-CM

## 2024-01-11 DIAGNOSIS — Z0130 Encounter for examination of blood pressure without abnormal findings: Secondary | ICD-10-CM

## 2024-01-11 DIAGNOSIS — Z794 Long term (current) use of insulin: Secondary | ICD-10-CM

## 2024-01-11 DIAGNOSIS — E782 Mixed hyperlipidemia: Secondary | ICD-10-CM | POA: Diagnosis not present

## 2024-01-11 LAB — POCT CBG (FASTING - GLUCOSE)-MANUAL ENTRY: Glucose Fasting, POC: 44 mg/dL — AB (ref 70–99)

## 2024-01-11 MED ORDER — FLUCELVAX 0.5 ML IM SUSY: 0.5000 mL | PREFILLED_SYRINGE | Freq: Once | INTRAMUSCULAR | 0 refills | Status: AC

## 2024-01-11 NOTE — Progress Notes (Signed)
 Established Patient Office Visit  Subjective:  Patient ID: Randy Deleon, male    DOB: Oct 03, 1965  Age: 58 y.o. MRN: [id number]  Chief Complaint  Patient presents with   Follow-up    6 week lab results    No new complaints, here for lab review and medication refills. Labs reviewed and notable for well controlled diabetes, fructosamine at target. Unsure if he'Elfida Shimada had hypoglycemic episodes at home as he has not started using his Freestyle Libre CGM.      No other concerns at this time.   Past Medical History:  Diagnosis Date   Diabetes mellitus without complication (HCC)    Hypertension     Past Surgical History:  Procedure Laterality Date   COLONOSCOPY WITH PROPOFOL  N/A 04/16/2021   Procedure: COLONOSCOPY WITH PROPOFOL ;  Surgeon: Unk Corinn Skiff, MD;  Location: [facility] END[facility]COPY;  Service: Gastroenterology;  Laterality: N/A;   none     TONSILLECTOMY      Social History   Socioeconomic History   Marital status: Single    Spouse name: Not on file   Number of children: Not on file   Years of education: Not on file   Highest education level: Not on file  Occupational History   Not on file  Tobacco Use   Smoking status: Every Day    Current packs/day: 0.50    Average packs/day: 0.5 packs/day for 26.9 years (13.5 ttl pk-yrs)    Types: Cigarettes    Start date: 01/29/1997   Smokeless tobacco: Never   Tobacco comments:    Refer to [facility]  Vaping Use   Vaping status: Never Used  Substance and Sexual Activity   Alcohol use: Not Currently    Comment: occ   Drug use: Not Currently   Sexual activity: Not on file  Other Topics Concern   Not on file  Social History Narrative   Not on file   Social Drivers of Health   Financial Resource Strain: Not on file  Food Insecurity: No Food Insecurity (07/23/2023)   Hunger Vital Sign    Worried About Running Out of Food in the Last Year: Never true    Ran Out of Food in the Last Year: Never true  Transportation  Needs: No Transportation Needs (07/23/2023)   PRAPARE - Administrator, Civil Service (Medical): No    Lack of Transportation (Non-Medical): No  Physical Activity: Not on file  Stress: Not on file  Social Connections: Not on file  Intimate Partner Violence: Not At Risk (07/23/2023)   Humiliation, Afraid, Rape, and Kick questionnaire    Fear of Current or Ex-Partner: No    Emotionally Abused: No    Physically Abused: No    Sexually Abused: No    Family History  Problem Relation Age of Onset   Diabetes Mother    Hypertension Mother    Congestive Heart Failure Father    Diabetes Father    Hypertension Father     Allergies  Allergen Reactions   Contrast Media [Iodinated Contrast Media]     Renal failure   Other Rash and Other (See Comments)    Tested allergic to LIMA BEANS   Shellfish Allergy Rash and Other (See Comments)    TESTED ALLERGIC TO SHELLFISH    Outpatient Medications Prior to Visit  Medication Sig   acetaminophen  (TYLENOL ) 325 MG tablet Take 2 tablets (650 mg total) by mouth every 6 (six) hours as needed for mild pain or fever.  amLODipine  (NORVASC ) 10 MG tablet Take 1 tablet (10 mg total) by mouth daily.   carvedilol  (COREG ) 25 MG tablet TAKE 1 TABLET(25 MG) BY MOUTH TWICE DAILY   Continuous Glucose Receiver (FREESTYLE LIBRE 3 READER) DEVI 1 Device by Does not apply route daily.   Continuous Glucose Sensor (FREESTYLE LIBRE 3 PLUS SENSOR) MISC Change sensor every 15 days.   dapagliflozin  propanediol (FARXIGA ) 10 MG TABS tablet Take 1 tablet (10 mg total) by mouth daily before breakfast.   ezetimibe  (ZETIA ) 10 MG tablet Take 1 tablet (10 mg total) by mouth daily.   fenofibrate  160 MG tablet TAKE 1 TABLET(160 MG) BY MOUTH EVERY MORNING   folic acid  (FOLVITE ) 1 MG tablet Take 1 tablet (1 mg total) by mouth daily.   gabapentin  (NEURONTIN ) 400 MG capsule TAKE 1 CAPSULE(400 MG) BY MOUTH THREE TIMES DAILY   glucose blood (ONETOUCH ULTRA TEST) test strip Use  with device to check sugar three times daily   Insulin  Glargine (BASAGLAR  KWIKPEN) 100 UNIT/ML Inject 92 Units into the skin daily.   insulin  lispro (HUMALOG  KWIKPEN) 100 UNIT/ML KwikPen Inject 6 Units into the skin 3 (three) times daily with each meal plus add 0-15 units 3 (three) times daily per sliding scale as directed.   Insulin  Pen Needle (BD PEN NEEDLE MINI ULTRAFINE) 31G X 5 MM MISC USE WITH INSULIN  PENS   Lancet Devices (ONETOUCH DELICA PLUS LANCING) MISC Use with device to check sugars three times daily   metFORMIN  (GLUCOPHAGE ) 1000 MG tablet Take 1 tablet (1,000 mg total) by mouth 2 (two) times daily with a meal.   rosuvastatin  (CRESTOR ) 40 MG tablet Take 1 tablet (40 mg total) by mouth every evening.   sildenafil (VIAGRA) 100 MG tablet TAKE 1 TABLET BY MOUTH EVERY DAY AS NEEDED 30 MINUTES TO 4 HOURS BEFORE INTERCOUSE   thiamine  (VITAMIN B1) 100 MG tablet Take 1 tablet (100 mg total) by mouth daily.   vitamin B-12 (CYANOCOBALAMIN) 500 MCG tablet Take 500 mcg by mouth daily.   Cholecalciferol  125 MCG (5000 UT) capsule Take 5,000 Units by mouth daily. (Patient not taking: Reported on 01/11/2024)   [DISCONTINUED] Continuous Glucose Sensor (DEXCOM G7 SENSOR) MISC 1 Device by Does not apply route as directed. Apply new sensor every 10 days (Patient not taking: Reported on 01/11/2024)   No facility-administered medications prior to visit.    Review of Systems  Constitutional:  Negative for weight loss (gained 8 lbs).       Objective:   BP 130/72   Pulse 64   Ht 5' 9 (1.753 m)   Wt 231 lb (104.8 kg)   SpO2 97%   BMI 34.11 kg/m   Vitals:   01/11/24 1044  BP: 130/72  Pulse: 64  Height: 5' 9 (1.753 m)  Weight: 231 lb (104.8 kg)  SpO2: 97%  BMI (Calculated): 34.1    Physical Exam Vitals reviewed.  Constitutional:      Appearance: Normal appearance. He is obese.  HENT:     Head: Normocephalic.     Left Ear: There is no impacted cerumen.     Nose: Nose normal.      Mouth/Throat:     Mouth: Mucous membranes are moist.     Pharynx: No posterior oropharyngeal erythema.  Eyes:     Extraocular Movements: Extraocular movements intact.     Pupils: Pupils are equal, round, and reactive to light.  Cardiovascular:     Rate and Rhythm: Regular rhythm.     Chest  Wall: PMI is not displaced.     Pulses: Normal pulses.     Heart sounds: Normal heart sounds. No murmur heard. Pulmonary:     Effort: Pulmonary effort is normal.     Breath sounds: Normal air entry. No rhonchi or rales.  Abdominal:     General: Abdomen is flat. Bowel sounds are normal. There is no distension.     Palpations: Abdomen is soft. There is no hepatomegaly, splenomegaly or mass.     Tenderness: There is no abdominal tenderness.  Musculoskeletal:        General: Normal range of motion.     Cervical back: Normal range of motion and neck supple.     Right lower leg: No edema.     Left lower leg: No edema.  Skin:    General: Skin is warm and dry.  Neurological:     General: No focal deficit present.     Mental Status: He is alert and oriented to person, place, and time.     Cranial Nerves: No cranial nerve deficit.     Motor: No weakness.  Psychiatric:        Mood and Affect: Mood normal.        Behavior: Behavior normal.      Results for orders placed or performed in visit on 01/11/24  POCT CBG (Fasting - Glucose)  Result Value Ref Range   Glucose Fasting, POC 44 (A) 70 - 99 mg/dL    Recent Results (from the past 2160 hours)  POCT CBG (Fasting - Glucose)     Status: Abnormal   Collection Time: 10/24/23 11:58 AM  Result Value Ref Range   Glucose Fasting, POC 39 (A) 70 - 99 mg/dL  Comprehensive metabolic panel with GFR     Status: Abnormal   Collection Time: 11/28/23 11:30 AM  Result Value Ref Range   Glucose 68 (L) 70 - 99 mg/dL   BUN 9 6 - 24 mg/dL   Creatinine, Ser 9.23 0.76 - 1.27 mg/dL   eGFR 894 >40 fO/fpw/8.26   BUN/Creatinine Ratio 12 9 - 20   Sodium 143 134 -  144 mmol/L   Potassium 3.8 3.5 - 5.2 mmol/L   Chloride 104 96 - 106 mmol/L   CO2 24 20 - 29 mmol/L   Calcium  10.0 8.7 - 10.2 mg/dL   Total Protein 6.8 6.0 - 8.5 g/dL   Albumin 4.6 3.8 - 4.9 g/dL   Globulin, Total 2.2 1.5 - 4.5 g/dL   Bilirubin Total 0.3 0.0 - 1.2 mg/dL   Alkaline Phosphatase 46 44 - 121 IU/L    Comment: **Effective December 03, 2023 Alkaline Phosphatase**   reference interval will be changing to:              Age                Male          Male           0 -  5 days         47 - 127       47 - 127           6 - 10 days         29 - 242       29 - 242          11 - 20 days        109 - 357  109 - 357          21 - 30 days         94 - 494       94 - 494           1 -  2 months      149 - 539      149 - 539           3 -  6 months      131 - 452      131 - 452           7 - 11 months      117 - 401      117 - 401   12 months -  6 years       158 - 369      158 - 369           7 - 12 years       150 - 409      150 - 409               13 years       156 - 435       78 - 227               14 years       114 - 375       64 - 161               15 years        88 - 279       56 - 134               16 years        74 - 207       51 - 121               17 years        63 - 161       47 - 113          18 - 20 years        51 - 125       42 - 106          21 - 50 years         47 - 123       41 - 116          51 - 80 years        49 - 135       51 - 125              >80 years        48 - 129       48 - 129    AST 11 0 - 40 IU/L   ALT 11 0 - 44 IU/L  Lipid panel     Status: Abnormal   Collection Time: 11/28/23 11:30 AM  Result Value Ref Range   Cholesterol, Total 243 (H) 100 - 199 mg/dL   Triglycerides 795 (H) 0 - 149 mg/dL   HDL 35 (L) >60 mg/dL   VLDL Cholesterol Cal 38 5 - 40 mg/dL   LDL Chol Calc (NIH) 829 (H) 0 - 99 mg/dL   Chol/HDL Ratio 6.9 (H) 0.0 - 5.0 ratio    Comment:  T. Chol/HDL Ratio                                              Men  Women                               1/2 Avg.Risk  3.4    3.3                                   Avg.Risk  5.0    4.4                                2X Avg.Risk  9.6    7.1                                3X Avg.Risk 23.4   11.0   Hemoglobin A1c     Status: Abnormal   Collection Time: 11/28/23 11:30 AM  Result Value Ref Range   Hgb A1c MFr Bld 7.3 (H) 4.8 - 5.6 %    Comment:          Prediabetes: 5.7 - 6.4          Diabetes: >6.4          Glycemic control for adults with diabetes: <7.0    Est. average glucose Bld gHb Est-mCnc 163 mg/dL  POCT CBG (Fasting - Glucose)     Status: Abnormal   Collection Time: 11/30/23 10:02 AM  Result Value Ref Range   Glucose Fasting, POC 66 (A) 70 - 99 mg/dL  POC CREATINE & ALBUMIN,URINE     Status: Abnormal   Collection Time: 11/30/23 10:50 AM  Result Value Ref Range   Microalbumin Ur, POC 100 mg/L   Creatinine, POC 30 mg/dL   Albumin/Creatinine Ratio, Urine, POC 30-300   BMP8+Anion Gap     Status: Abnormal   Collection Time: 01/09/24 10:56 AM  Result Value Ref Range   Glucose 52 (L) 70 - 99 mg/dL   BUN 10 6 - 24 mg/dL   Creatinine, Ser 9.30 (L) 0.76 - 1.27 mg/dL   eGFR 890 >40 fO/fpw/8.26   BUN/Creatinine Ratio 14 9 - 20   Sodium 142 134 - 144 mmol/L   Potassium 3.9 3.5 - 5.2 mmol/L   Chloride 103 96 - 106 mmol/L   CO2 26 20 - 29 mmol/L   Anion Gap 13.0 10.0 - 18.0 mmol/L   Calcium  9.8 8.7 - 10.2 mg/dL  Fructosamine     Status: None   Collection Time: 01/09/24 10:57 AM  Result Value Ref Range   Fructosamine 257 0 - 285 umol/L    Comment: Published reference interval for apparently healthy subjects between age 55 and 35 is 68 - 285 umol/L and in a poorly controlled diabetic population is 228 - 563 umol/L with a mean of 396 umol/L.   POCT CBG (Fasting - Glucose)     Status: Abnormal   Collection Time: 01/11/24 10:50 AM  Result Value Ref Range   Glucose Fasting, POC 44 (A) 70 - 99 mg/dL      Assessment & Plan:   Tyqwan was  seen today for follow-up.  Type 2 diabetes mellitus without complication, with long-term current use of insulin  (HCC) -     POCT CBG (Fasting - Glucose)  Type 2 diabetes mellitus with diabetic polyneuropathy, with long-term current use of insulin  (HCC) -     Hemoglobin A1c  Mixed hyperlipidemia -     Lipid panel  Needs flu shot -     Flucelvax; Inject 0.5 mLs into the muscle once for 1 dose.  Dispense: 0.5 mL; Refill: 0   The current medical regimen is effective;  continue present plan and medications. Instructed to start using CGM. Problem List Items Addressed This Visit       Endocrine   Type 2 diabetes mellitus with diabetic polyneuropathy, with long-term current use of insulin  (HCC)     Other   HLD (hyperlipidemia)   Other Visit Diagnoses       Type 2 diabetes mellitus without complication, with long-term current use of insulin  (HCC)    -  Primary   Relevant Orders   POCT CBG (Fasting - Glucose) (Completed)     Needs flu shot       Relevant Medications   influenza vaccine (FLUCELVAX) 0.5 ML injection       Return in about 6 weeks (around 02/22/2024) for fu with labs prior.   Total time spent: 20 minutes. This time includes review of previous notes and results and patient face to face interaction during today'Hernando Reali visit.    Sherrill Cinderella Perry, MD  01/11/2024   This document may have been prepared by [facility] Voice Recognition software and as such may include unintentional dictation errors.

## 2024-01-29 ENCOUNTER — Other Ambulatory Visit: Payer: Self-pay | Admitting: Internal Medicine

## 2024-02-20 ENCOUNTER — Other Ambulatory Visit

## 2024-02-21 LAB — LIPID PANEL
Chol/HDL Ratio: 5.1 ratio — ABNORMAL HIGH (ref 0.0–5.0)
Cholesterol, Total: 215 mg/dL — ABNORMAL HIGH (ref 100–199)
HDL: 42 mg/dL (ref >39)
LDL Chol Calc (NIH): 132 mg/dL — ABNORMAL HIGH (ref 0–99)
Triglycerides: 229 mg/dL — ABNORMAL HIGH (ref 0–149)
VLDL Cholesterol Cal: 41 mg/dL — ABNORMAL HIGH (ref 5–40)

## 2024-02-21 LAB — HEMOGLOBIN A1C
Est. average glucose Bld gHb Est-mCnc: 146 mg/dL
Hgb A1c MFr Bld: 6.7 % — ABNORMAL HIGH (ref 4.8–5.6)

## 2024-02-26 ENCOUNTER — Ambulatory Visit: Admitting: Internal Medicine

## 2024-03-12 ENCOUNTER — Encounter: Payer: Self-pay | Admitting: Internal Medicine

## 2024-03-12 ENCOUNTER — Ambulatory Visit: Admitting: Internal Medicine

## 2024-03-12 VITALS — BP 128/83 | HR 81 | Temp 98.2°F | Ht 69.0 in | Wt 228.4 lb

## 2024-03-12 DIAGNOSIS — R809 Proteinuria, unspecified: Secondary | ICD-10-CM

## 2024-03-12 DIAGNOSIS — Z794 Long term (current) use of insulin: Secondary | ICD-10-CM

## 2024-03-12 DIAGNOSIS — E1165 Type 2 diabetes mellitus with hyperglycemia: Secondary | ICD-10-CM

## 2024-03-12 DIAGNOSIS — E1142 Type 2 diabetes mellitus with diabetic polyneuropathy: Secondary | ICD-10-CM

## 2024-03-12 DIAGNOSIS — I10 Essential (primary) hypertension: Secondary | ICD-10-CM

## 2024-03-12 DIAGNOSIS — E119 Type 2 diabetes mellitus without complications: Secondary | ICD-10-CM

## 2024-03-12 DIAGNOSIS — N400 Enlarged prostate without lower urinary tract symptoms: Secondary | ICD-10-CM | POA: Diagnosis not present

## 2024-03-12 DIAGNOSIS — E782 Mixed hyperlipidemia: Secondary | ICD-10-CM | POA: Diagnosis not present

## 2024-03-12 DIAGNOSIS — E1129 Type 2 diabetes mellitus with other diabetic kidney complication: Secondary | ICD-10-CM

## 2024-03-12 LAB — POCT CBG (FASTING - GLUCOSE)-MANUAL ENTRY: Glucose Fasting, POC: 187 mg/dL — AB (ref 70–99)

## 2024-03-12 MED ORDER — CARVEDILOL 25 MG PO TABS: 25.0000 mg | ORAL_TABLET | Freq: Two times a day (BID) | ORAL | 1 refills | Status: AC

## 2024-03-12 MED ORDER — BASAGLAR KWIKPEN 100 UNIT/ML ~~LOC~~ SOPN: 92.0000 [IU] | PEN_INJECTOR | SUBCUTANEOUS | 0 refills | Status: AC

## 2024-03-12 MED ORDER — GABAPENTIN 600 MG PO TABS: 600.0000 mg | ORAL_TABLET | Freq: Three times a day (TID) | ORAL | 1 refills | Status: AC

## 2024-03-12 MED ORDER — NEXLIZET 180-10 MG PO TABS: 1.0000 | ORAL_TABLET | Freq: Every day | ORAL | 2 refills | Status: AC

## 2024-03-12 MED ORDER — METFORMIN HCL 1000 MG PO TABS: 1000.0000 mg | ORAL_TABLET | Freq: Two times a day (BID) | ORAL | 0 refills | Status: AC

## 2024-03-12 NOTE — Progress Notes (Signed)
 "  Established Patient Office Visit  Subjective:  Patient ID: Randy Deleon, male    DOB: 1966/01/13  Age: 58 y.o. MRN: [id number]  Chief Complaint  Patient presents with   Follow-up    6 week follow up lab results. Pt states he is struggling with his neuropathy.     No new complaints, here for lab review and medication refills. Labs reviewed and notable for well controlled diabetes, A1c at target but lipids not at target. Denies any hypoglycemic episodes and home bg readings have been at target.     No other concerns at this time.   Past Medical History:  Diagnosis Date   Diabetes mellitus without complication (HCC)    Hypertension     Past Surgical History:  Procedure Laterality Date   COLONOSCOPY WITH PROPOFOL  N/A 04/16/2021   Procedure: COLONOSCOPY WITH PROPOFOL ;  Surgeon: Unk Corinn Skiff, MD;  Location: [facility] ENDOSCOPY;  Service: Gastroenterology;  Laterality: N/A;   none     TONSILLECTOMY      Social History   Socioeconomic History   Marital status: Single    Spouse name: Not on file   Number of children: Not on file   Years of education: Not on file   Highest education level: Not on file  Occupational History   Not on file  Tobacco Use   Smoking status: Every Day    Current packs/day: 0.50    Average packs/day: 0.5 packs/day for 27.1 years (13.6 ttl pk-yrs)    Types: Cigarettes    Start date: 01/29/1997   Smokeless tobacco: Never   Tobacco comments:    Refer to [facility]  Vaping Use   Vaping status: Never Used  Substance and Sexual Activity   Alcohol use: Not Currently    Comment: occ   Drug use: Not Currently   Sexual activity: Not on file  Other Topics Concern   Not on file  Social History Narrative   Not on file   Social Drivers of Health   Tobacco Use: High Risk (03/12/2024)   Patient History    Smoking Tobacco Use: Every Day    Smokeless Tobacco Use: Never    Passive Exposure: Not on file  Financial Resource Strain: Not on file   Food Insecurity: No Food Insecurity (07/23/2023)   Hunger Vital Sign    Worried About Running Out of Food in the Last Year: Never true    Ran Out of Food in the Last Year: Never true  Transportation Needs: No Transportation Needs (07/23/2023)   PRAPARE - Administrator, Civil Service (Medical): No    Lack of Transportation (Non-Medical): No  Physical Activity: Not on file  Stress: Not on file  Social Connections: Not on file  Intimate Partner Violence: Not At Risk (07/23/2023)   Humiliation, Afraid, Rape, and Kick questionnaire    Fear of Current or Ex-Partner: No    Emotionally Abused: No    Physically Abused: No    Sexually Abused: No  Depression (PHQ2-9): Not on file  Alcohol Screen: Not on file  Housing: Low Risk (07/23/2023)   Housing Stability Vital Sign    Unable to Pay for Housing in the Last Year: No    Number of Times Moved in the Last Year: 0    Homeless in the Last Year: No  Utilities: Not At Risk (07/23/2023)   [employer] Utilities    Threatened with loss of utilities: No  Health Literacy: Not on file  Family History  Problem Relation Age of Onset   Diabetes Mother    Hypertension Mother    Congestive Heart Failure Father    Diabetes Father    Hypertension Father     Allergies[1]  Show/hide medication list[2]  Review of Systems  Constitutional:  Positive for weight loss (3 lbs).  HENT: Negative.    Eyes: Negative.   Respiratory: Negative.    Cardiovascular: Negative.   Gastrointestinal: Negative.   Genitourinary: Negative.        ED  Skin: Negative.   Neurological:  Positive for tingling and sensory change.       Numbness and tingling in hands and feet  Endo/Heme/Allergies: Negative.        Objective:   BP 128/83   Pulse 81   Temp 98.2 F (36.8 C)   Ht 5' 9 (1.753 m)   Wt 228 lb 6.4 oz (103.6 kg)   SpO2 96%   BMI 33.73 kg/m   Vitals:   03/12/24 0926  BP: 128/83  Pulse: 81  Temp: 98.2 F (36.8 C)  Height: 5' 9 (1.753 m)   Weight: 228 lb 6.4 oz (103.6 kg)  SpO2: 96%  BMI (Calculated): 33.71    Physical Exam Vitals reviewed.  Constitutional:      Appearance: Normal appearance. He is obese.  HENT:     Head: Normocephalic.     Left Ear: There is no impacted cerumen.     Nose: Nose normal.     Mouth/Throat:     Mouth: Mucous membranes are moist.     Pharynx: No posterior oropharyngeal erythema.  Eyes:     Extraocular Movements: Extraocular movements intact.     Pupils: Pupils are equal, round, and reactive to light.  Cardiovascular:     Rate and Rhythm: Regular rhythm.     Chest Wall: PMI is not displaced.     Pulses: Normal pulses.     Heart sounds: Normal heart sounds. No murmur heard. Pulmonary:     Effort: Pulmonary effort is normal.     Breath sounds: Normal air entry. No rhonchi or rales.  Abdominal:     General: Abdomen is flat. Bowel sounds are normal. There is no distension.     Palpations: Abdomen is soft. There is no hepatomegaly, splenomegaly or mass.     Tenderness: There is no abdominal tenderness.  Musculoskeletal:        General: Normal range of motion.     Cervical back: Normal range of motion and neck supple.     Right lower leg: No edema.     Left lower leg: No edema.  Skin:    General: Skin is warm and dry.  Neurological:     General: No focal deficit present.     Mental Status: He is alert and oriented to person, place, and time.     Cranial Nerves: No cranial nerve deficit.     Motor: No weakness.  Psychiatric:        Mood and Affect: Mood normal.        Behavior: Behavior normal.      Results for orders placed or performed in visit on 03/12/24  POCT CBG (Fasting - Glucose)  Result Value Ref Range   Glucose Fasting, POC 187 (A) 70 - 99 mg/dL    Recent Results (from the past 2160 hours)  BMP8+Anion Gap     Status: Abnormal   Collection Time: 01/09/24 10:56 AM  Result Value Ref Range  Glucose 52 (L) 70 - 99 mg/dL   BUN 10 6 - 24 mg/dL   Creatinine, Ser  9.30 (L) 0.76 - 1.27 mg/dL   eGFR 890 >40 fO/fpw/8.26   BUN/Creatinine Ratio 14 9 - 20   Sodium 142 134 - 144 mmol/L   Potassium 3.9 3.5 - 5.2 mmol/L   Chloride 103 96 - 106 mmol/L   CO2 26 20 - 29 mmol/L   Anion Gap 13.0 10.0 - 18.0 mmol/L   Calcium  9.8 8.7 - 10.2 mg/dL  Fructosamine     Status: None   Collection Time: 01/09/24 10:57 AM  Result Value Ref Range   Fructosamine 257 0 - 285 umol/L    Comment: Published reference interval for apparently healthy subjects between age 47 and 40 is 88 - 285 umol/L and in a poorly controlled diabetic population is 228 - 563 umol/L with a mean of 396 umol/L.   POCT CBG (Fasting - Glucose)     Status: Abnormal   Collection Time: 01/11/24 10:50 AM  Result Value Ref Range   Glucose Fasting, POC 44 (A) 70 - 99 mg/dL  Hemoglobin J8r     Status: Abnormal   Collection Time: 02/20/24 11:19 AM  Result Value Ref Range   Hgb A1c MFr Bld 6.7 (H) 4.8 - 5.6 %    Comment:          Prediabetes: 5.7 - 6.4          Diabetes: >6.4          Glycemic control for adults with diabetes: <7.0    Est. average glucose Bld gHb Est-mCnc 146 mg/dL  Lipid panel     Status: Abnormal   Collection Time: 02/20/24 11:19 AM  Result Value Ref Range   Cholesterol, Total 215 (H) 100 - 199 mg/dL   Triglycerides 770 (H) 0 - 149 mg/dL   HDL 42 >60 mg/dL   VLDL Cholesterol Cal 41 (H) 5 - 40 mg/dL   LDL Chol Calc (NIH) 867 (H) 0 - 99 mg/dL   Chol/HDL Ratio 5.1 (H) 0.0 - 5.0 ratio    Comment:                                   T. Chol/HDL Ratio                                             Men  Women                               1/2 Avg.Risk  3.4    3.3                                   Avg.Risk  5.0    4.4                                2X Avg.Risk  9.6    7.1                                3X Avg.Risk 23.4  11.0   POCT CBG (Fasting - Glucose)     Status: Abnormal   Collection Time: 03/12/24  9:32 AM  Result Value Ref Range   Glucose Fasting, POC 187 (A) 70 - 99 mg/dL       Assessment & Plan:   Problem List Items Addressed This Visit       Cardiovascular and Mediastinum   HTN (hypertension)   Relevant Medications   Bempedoic Acid-Ezetimibe  (NEXLIZET ) 180-10 MG TABS   carvedilol  (COREG ) 25 MG tablet   Other Relevant Orders   CBC With Diff/Platelet     Endocrine   Type 2 diabetes mellitus with diabetic polyneuropathy, with long-term current use of insulin  (HCC) - Primary   Relevant Medications   Insulin  Glargine (BASAGLAR  KWIKPEN) 100 UNIT/ML   metFORMIN  (GLUCOPHAGE ) 1000 MG tablet   gabapentin  (NEURONTIN ) 600 MG tablet   Other Relevant Orders   POCT CBG (Fasting - Glucose) (Completed)     Other   HLD (hyperlipidemia)   Relevant Medications   Bempedoic Acid-Ezetimibe  (NEXLIZET ) 180-10 MG TABS   carvedilol  (COREG ) 25 MG tablet   Other Relevant Orders   Comprehensive metabolic panel with GFR   Other Visit Diagnoses       Diabetic polyneuropathy associated with type 2 diabetes mellitus (HCC)       Relevant Medications   Insulin  Glargine (BASAGLAR  KWIKPEN) 100 UNIT/ML   metFORMIN  (GLUCOPHAGE ) 1000 MG tablet   gabapentin  (NEURONTIN ) 600 MG tablet     Microalbuminuria due to type 2 diabetes mellitus (HCC)       Relevant Medications   Insulin  Glargine (BASAGLAR  KWIKPEN) 100 UNIT/ML   metFORMIN  (GLUCOPHAGE ) 1000 MG tablet     Type 2 diabetes mellitus without complication, with long-term current use of insulin  (HCC)       Relevant Medications   Insulin  Glargine (BASAGLAR  KWIKPEN) 100 UNIT/ML   metFORMIN  (GLUCOPHAGE ) 1000 MG tablet     Benign prostatic hyperplasia without lower urinary tract symptoms       Relevant Orders   PSA       Return in about 3 months (around 06/10/2024) for cpe with labs prior.   Total time spent: 20 minutes. This time includes review of previous notes and results and patient face to face interaction during today'Kinsley Holderman visit.    Sherrill Cinderella Perry, MD  03/12/2024   This document may have been prepared by  [facility] Voice Recognition software and as such may include unintentional dictation errors.     [1]  Allergies Allergen Reactions   Contrast Media [Iodinated Contrast Media]     Renal failure   Other Rash and Other (See Comments)    Tested allergic to LIMA BEANS   Shellfish Allergy Rash and Other (See Comments)    TESTED ALLERGIC TO SHELLFISH  [2]  Outpatient Medications Prior to Visit  Medication Sig   acetaminophen  (TYLENOL ) 325 MG tablet Take 2 tablets (650 mg total) by mouth every 6 (six) hours as needed for mild pain or fever.   amLODipine  (NORVASC ) 10 MG tablet Take 1 tablet (10 mg total) by mouth daily.   Continuous Glucose Receiver (FREESTYLE LIBRE 3 READER) DEVI 1 Device by Does not apply route daily.   Continuous Glucose Sensor (FREESTYLE LIBRE 3 PLUS SENSOR) MISC Change sensor every 15 days.   fenofibrate  160 MG tablet TAKE 1 TABLET(160 MG) BY MOUTH EVERY MORNING   folic acid  (FOLVITE ) 1 MG tablet Take 1 tablet (1 mg total) by mouth daily.   glucose blood (ONETOUCH ULTRA TEST)  test strip Use with device to check sugar three times daily   HUMALOG  KWIKPEN 100 UNIT/ML KwikPen INJECT DOSE AS DIRECTED PER SLIDING SCALE UNDER THE SKIN THREE TIMES DAILY. MAX OF 45 UNITS PER DAY   Insulin  Pen Needle (BD PEN NEEDLE MINI ULTRAFINE) 31G X 5 MM MISC USE WITH INSULIN  PENS   Lancet Devices (ONETOUCH DELICA PLUS LANCING) MISC Use with device to check sugars three times daily   rosuvastatin  (CRESTOR ) 40 MG tablet Take 1 tablet (40 mg total) by mouth every evening.   sildenafil (VIAGRA) 100 MG tablet TAKE 1 TABLET BY MOUTH EVERY DAY AS NEEDED 30 MINUTES TO 4 HOURS BEFORE INTERCOUSE   thiamine  (VITAMIN B1) 100 MG tablet Take 1 tablet (100 mg total) by mouth daily.   vitamin B-12 (CYANOCOBALAMIN) 500 MCG tablet Take 500 mcg by mouth daily.   [DISCONTINUED] carvedilol  (COREG ) 25 MG tablet TAKE 1 TABLET(25 MG) BY MOUTH TWICE DAILY   [DISCONTINUED] ezetimibe  (ZETIA ) 10 MG tablet Take 1 tablet (10  mg total) by mouth daily.   [DISCONTINUED] gabapentin  (NEURONTIN ) 400 MG capsule TAKE 1 CAPSULE(400 MG) BY MOUTH THREE TIMES DAILY   [DISCONTINUED] Insulin  Glargine (BASAGLAR  KWIKPEN) 100 UNIT/ML Inject 92 Units into the skin daily.   [DISCONTINUED] metFORMIN  (GLUCOPHAGE ) 1000 MG tablet Take 1 tablet (1,000 mg total) by mouth 2 (two) times daily with a meal.   Cholecalciferol  125 MCG (5000 UT) capsule Take 5,000 Units by mouth daily. (Patient not taking: Reported on 03/12/2024)   No facility-administered medications prior to visit.   "

## 2024-06-10 ENCOUNTER — Encounter: Admitting: Internal Medicine
